# Patient Record
Sex: Female | Born: 1935 | Race: White | Hispanic: No | Smoking: Former smoker
Health system: Southern US, Community
[De-identification: ages and names within clinical notes are randomized; demographics above are authoritative.]

## PROBLEM LIST (undated history)

## (undated) DIAGNOSIS — R0902 Hypoxemia: Secondary | ICD-10-CM

## (undated) DIAGNOSIS — C4492 Squamous cell carcinoma of skin, unspecified: Secondary | ICD-10-CM

## (undated) DIAGNOSIS — R32 Unspecified urinary incontinence: Secondary | ICD-10-CM

## (undated) DIAGNOSIS — M1990 Unspecified osteoarthritis, unspecified site: Secondary | ICD-10-CM

## (undated) DIAGNOSIS — C801 Malignant (primary) neoplasm, unspecified: Secondary | ICD-10-CM

## (undated) DIAGNOSIS — R351 Nocturia: Secondary | ICD-10-CM

## (undated) DIAGNOSIS — G4700 Insomnia, unspecified: Secondary | ICD-10-CM

## (undated) DIAGNOSIS — G459 Transient cerebral ischemic attack, unspecified: Secondary | ICD-10-CM

## (undated) DIAGNOSIS — T7840XA Allergy, unspecified, initial encounter: Secondary | ICD-10-CM

## (undated) DIAGNOSIS — K219 Gastro-esophageal reflux disease without esophagitis: Secondary | ICD-10-CM

## (undated) DIAGNOSIS — R197 Diarrhea, unspecified: Secondary | ICD-10-CM

## (undated) DIAGNOSIS — I639 Cerebral infarction, unspecified: Secondary | ICD-10-CM

## (undated) DIAGNOSIS — N393 Stress incontinence (female) (male): Secondary | ICD-10-CM

## (undated) DIAGNOSIS — A4151 Sepsis due to Escherichia coli [E. coli]: Secondary | ICD-10-CM

## (undated) HISTORY — DX: Transient cerebral ischemic attack, unspecified: G45.9

## (undated) HISTORY — DX: Gastro-esophageal reflux disease without esophagitis: K21.9

## (undated) HISTORY — DX: Unspecified osteoarthritis, unspecified site: M19.90

## (undated) HISTORY — DX: Diarrhea, unspecified: R19.7

## (undated) HISTORY — DX: Squamous cell carcinoma of skin, unspecified: C44.92

## (undated) HISTORY — DX: Hypoxemia: R09.02

## (undated) HISTORY — DX: Stress incontinence (female) (male): N39.3

## (undated) HISTORY — DX: Malignant (primary) neoplasm, unspecified: C80.1

## (undated) HISTORY — DX: Allergy, unspecified, initial encounter: T78.40XA

## (undated) HISTORY — DX: Nocturia: R35.1

## (undated) HISTORY — DX: Unspecified urinary incontinence: R32

---
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
## 1957-04-22 HISTORY — PX: APPENDECTOMY: SHX54

## 1957-04-22 HISTORY — PX: ABDOMINAL HYSTERECTOMY: SHX81

## 1980-04-22 HISTORY — PX: BREAST SURGERY: SHX581

## 1999-05-15 ENCOUNTER — Encounter: Payer: Self-pay | Admitting: Family Medicine

## 1999-05-15 ENCOUNTER — Encounter: Admission: RE | Admit: 1999-05-15 | Discharge: 1999-05-15 | Payer: Self-pay | Admitting: Family Medicine

## 2007-06-09 ENCOUNTER — Ambulatory Visit (HOSPITAL_COMMUNITY): Admission: RE | Admit: 2007-06-09 | Discharge: 2007-06-09 | Payer: Self-pay | Admitting: Family Medicine

## 2007-06-25 ENCOUNTER — Ambulatory Visit: Admission: RE | Admit: 2007-06-25 | Discharge: 2007-06-25 | Payer: Self-pay | Admitting: Family Medicine

## 2007-06-25 ENCOUNTER — Encounter: Payer: Self-pay | Admitting: Family Medicine

## 2007-06-25 ENCOUNTER — Ambulatory Visit: Payer: Self-pay | Admitting: Vascular Surgery

## 2007-07-14 ENCOUNTER — Ambulatory Visit (HOSPITAL_COMMUNITY): Admission: RE | Admit: 2007-07-14 | Discharge: 2007-07-14 | Payer: Self-pay | Admitting: General Surgery

## 2008-12-22 ENCOUNTER — Encounter: Admission: RE | Admit: 2008-12-22 | Discharge: 2008-12-22 | Payer: Self-pay | Admitting: Family Medicine

## 2010-01-11 ENCOUNTER — Encounter: Admission: RE | Admit: 2010-01-11 | Discharge: 2010-01-11 | Payer: Self-pay | Admitting: Family Medicine

## 2010-12-04 ENCOUNTER — Telehealth: Payer: Self-pay | Admitting: Family Medicine

## 2010-12-04 NOTE — Telephone Encounter (Signed)
I am doing this on a case by case basis.  Since I have seen previously I will be happy to see again.

## 2010-12-04 NOTE — Telephone Encounter (Signed)
[name] was a pt of yours at [facility]. She is a medicare pt and would like to be your pt again. Are you excepting any new medicare pt at this time?

## 2010-12-07 ENCOUNTER — Other Ambulatory Visit: Payer: Self-pay | Admitting: Family Medicine

## 2010-12-07 DIAGNOSIS — I6529 Occlusion and stenosis of unspecified carotid artery: Secondary | ICD-10-CM

## 2010-12-17 ENCOUNTER — Other Ambulatory Visit: Payer: Self-pay | Admitting: Family Medicine

## 2010-12-17 DIAGNOSIS — Z1231 Encounter for screening mammogram for malignant neoplasm of breast: Secondary | ICD-10-CM

## 2010-12-27 ENCOUNTER — Ambulatory Visit: Payer: Self-pay | Admitting: Family Medicine

## 2011-01-08 ENCOUNTER — Ambulatory Visit (HOSPITAL_COMMUNITY): Payer: Self-pay

## 2011-01-16 ENCOUNTER — Ambulatory Visit
Admission: RE | Admit: 2011-01-16 | Discharge: 2011-01-16 | Disposition: A | Discharge status: Home / Self Care | Payer: Medicare Other | Source: Ambulatory Visit | Attending: Family Medicine | Admitting: Family Medicine

## 2011-01-16 DIAGNOSIS — I6529 Occlusion and stenosis of unspecified carotid artery: Secondary | ICD-10-CM

## 2011-01-22 ENCOUNTER — Ambulatory Visit (HOSPITAL_COMMUNITY)
Admission: RE | Admit: 2011-01-22 | Discharge: 2011-01-22 | Disposition: A | Discharge status: Home / Self Care | Payer: Medicare Other | Source: Ambulatory Visit | Attending: Family Medicine | Admitting: Family Medicine

## 2011-01-22 DIAGNOSIS — Z1231 Encounter for screening mammogram for malignant neoplasm of breast: Secondary | ICD-10-CM | POA: Insufficient documentation

## 2011-02-01 ENCOUNTER — Ambulatory Visit: Payer: Self-pay | Admitting: Family Medicine

## 2011-02-18 LAB — HM MAMMOGRAPHY: HM Mammogram: NEGATIVE

## 2011-03-21 ENCOUNTER — Ambulatory Visit (INDEPENDENT_AMBULATORY_CARE_PROVIDER_SITE_OTHER): Payer: Medicare Other | Admitting: Family Medicine

## 2011-03-21 ENCOUNTER — Encounter: Payer: Self-pay | Admitting: Family Medicine

## 2011-03-21 DIAGNOSIS — M19049 Primary osteoarthritis, unspecified hand: Secondary | ICD-10-CM

## 2011-03-21 DIAGNOSIS — N393 Stress incontinence (female) (male): Secondary | ICD-10-CM | POA: Insufficient documentation

## 2011-03-21 DIAGNOSIS — C4492 Squamous cell carcinoma of skin, unspecified: Secondary | ICD-10-CM

## 2011-03-21 DIAGNOSIS — K219 Gastro-esophageal reflux disease without esophagitis: Secondary | ICD-10-CM | POA: Insufficient documentation

## 2011-03-21 NOTE — Progress Notes (Signed)
  Subjective:    Patient ID: [name], female    DOB: 06-17-1922, 77 y.o.   MRN: [id number]  HPI  New patient to establish care. Past medical history reviewed. She has history of squamous cell skin cancer, stress urine incontinence, osteoarthritis mostly involving hands, and history of GERD.  Nexium 40 mg once daily. History of frequent bowel movements during the past year. Has seen gastroenterologist. Records pending. Per patient had EGD and colonoscopy last March. She has some sort of polyp that was removed at [facility]. She continues to have frequent bowel movements but no diarrhea. Mild weight loss during the past year. She states several labs have been done.  Surgical history significant for tonsillectomy and partial hysterectomy for menorrhagia many years ago. She quit smoking after 30 pack year history around age 25.  Family history is significant for sister with breast cancer. Father had some type of bone cancer. Patient is widowed. Smoking history as above. No alcohol use. Lives alone. Exercises 3 days per week.  Past Medical History  Diagnosis Date  . Arthritis   . Cancer     squamous cell  . Incontinence of urine    Past Surgical History  Procedure Date  . Breast surgery 1981    breast  . Tonsillectomy   . Abdominal hysterectomy     reports that she quit smoking about 34 years ago. Her smoking use included Cigarettes. She has a 15 pack-year smoking history. She does not have any smokeless tobacco history on file. Her alcohol and drug histories not on file. family history includes Arthritis in her mother and Cancer in her sister. Not on File    Review of Systems  Constitutional: Negative for fever, activity change, appetite change and fatigue.  HENT: Negative for hearing loss, ear pain, sore throat and trouble swallowing.   Eyes: Negative for visual disturbance.  Respiratory: Negative for cough and shortness of breath.   Cardiovascular: Negative for chest pain  and palpitations.  Gastrointestinal: Negative for abdominal pain, diarrhea, constipation and blood in stool.  Genitourinary: Negative for dysuria and hematuria.  Musculoskeletal: Positive for arthralgias. Negative for myalgias and back pain.  Skin: Negative for rash.  Neurological: Negative for dizziness, syncope and headaches.  Hematological: Negative for adenopathy.  Psychiatric/Behavioral: Negative for confusion and dysphoric mood.       Objective:   Physical Exam  Constitutional: She appears well-developed and well-nourished.  HENT:  Right Ear: External ear normal.  Left Ear: External ear normal.  Mouth/Throat: Oropharynx is clear and moist.  Neck: Neck supple.  Cardiovascular: Normal rate and regular rhythm.   Pulmonary/Chest: Effort normal and breath sounds normal. No respiratory distress. She has no wheezes. She has no rales.  Abdominal: Soft. There is no tenderness.  Musculoskeletal: She exhibits no edema.       Osteoarthritis changes hands  Lymphadenopathy:    She has no cervical adenopathy.          Assessment & Plan:  #1 osteoarthritis mostly involving hands. Takes Tylenol as needed #2 history of squamous cell skin cancer. Followed by dermatology  #3 history of urine stress incontinence. Minimal symptoms currently. Observe #4 history of GERD stable continue Nexium. Will need refills in one month  #5 health maintenance. Schedule Medicare wellness exam for February  #6 history of smoking previously

## 2011-03-22 ENCOUNTER — Telehealth: Payer: Self-pay | Admitting: Family Medicine

## 2011-03-22 NOTE — Telephone Encounter (Signed)
Pt wanted you to know what she contacted her [employer] rep and was informed not to get a new script until 04/24/11. Pt will call back end of December with refill info

## 2011-04-04 ENCOUNTER — Ambulatory Visit (INDEPENDENT_AMBULATORY_CARE_PROVIDER_SITE_OTHER): Payer: Medicare Other

## 2011-04-04 DIAGNOSIS — S91009A Unspecified open wound, unspecified ankle, initial encounter: Secondary | ICD-10-CM

## 2011-04-04 DIAGNOSIS — S81009A Unspecified open wound, unspecified knee, initial encounter: Secondary | ICD-10-CM

## 2011-04-04 DIAGNOSIS — M79609 Pain in unspecified limb: Secondary | ICD-10-CM

## 2011-04-08 ENCOUNTER — Telehealth: Payer: Self-pay | Admitting: *Deleted

## 2011-04-08 MED ORDER — ESOMEPRAZOLE MAGNESIUM 40 MG PO CPDR: 40.0000 mg | DELAYED_RELEASE_CAPSULE | Freq: Every day | ORAL | Status: DC

## 2011-04-08 NOTE — Telephone Encounter (Signed)
Rx sent to pharmacy   

## 2011-04-08 NOTE — Telephone Encounter (Signed)
Pt needs a refill on nexium 90 days with 3 refills. Left message for pt to call back to let us know what mail order company to send rx to.

## 2011-04-23 HISTORY — PX: COLONOSCOPY: SHX174

## 2011-04-30 DIAGNOSIS — M205X9 Other deformities of toe(s) (acquired), unspecified foot: Secondary | ICD-10-CM | POA: Diagnosis not present

## 2011-04-30 DIAGNOSIS — G5760 Lesion of plantar nerve, unspecified lower limb: Secondary | ICD-10-CM | POA: Diagnosis not present

## 2011-05-02 DIAGNOSIS — H353190 Nonexudative age-related macular degeneration, unspecified eye, stage unspecified: Secondary | ICD-10-CM | POA: Diagnosis not present

## 2011-05-02 DIAGNOSIS — Z961 Presence of intraocular lens: Secondary | ICD-10-CM | POA: Diagnosis not present

## 2011-05-03 ENCOUNTER — Telehealth: Payer: Self-pay | Admitting: *Deleted

## 2011-05-03 NOTE — Telephone Encounter (Addendum)
Pt needs a fax order sent to [facility] at [phone]  TENS UNIT

## 2011-05-03 NOTE — Telephone Encounter (Addendum)
Uses forf head pain.  Scheduled appt to come and discuss with Dr. Caryl Never.

## 2011-05-03 NOTE — Telephone Encounter (Signed)
[name], can you find out what the Tens Unit is needed for.  I do not see anything in the chart to inform Dr Caryl Never what this request is for.  Thanks

## 2011-05-07 ENCOUNTER — Encounter: Payer: Self-pay | Admitting: Family Medicine

## 2011-05-07 ENCOUNTER — Ambulatory Visit (INDEPENDENT_AMBULATORY_CARE_PROVIDER_SITE_OTHER): Payer: Medicare Other | Admitting: Family Medicine

## 2011-05-07 VITALS — BP 110/72 | HR 76 | Temp 98.3°F | Wt 99.0 lb

## 2011-05-07 DIAGNOSIS — S81009A Unspecified open wound, unspecified knee, initial encounter: Secondary | ICD-10-CM | POA: Diagnosis not present

## 2011-05-07 DIAGNOSIS — S91009A Unspecified open wound, unspecified ankle, initial encounter: Secondary | ICD-10-CM | POA: Diagnosis not present

## 2011-05-07 DIAGNOSIS — M5481 Occipital neuralgia: Secondary | ICD-10-CM

## 2011-05-07 DIAGNOSIS — R21 Rash and other nonspecific skin eruption: Secondary | ICD-10-CM

## 2011-05-07 DIAGNOSIS — M531 Cervicobrachial syndrome: Secondary | ICD-10-CM

## 2011-05-07 DIAGNOSIS — S81801A Unspecified open wound, right lower leg, initial encounter: Secondary | ICD-10-CM

## 2011-05-07 MED ORDER — CLOTRIMAZOLE-BETAMETHASONE 1-0.05 % EX CREA: TOPICAL_CREAM | Freq: Two times a day (BID) | CUTANEOUS | Status: DC

## 2011-05-07 NOTE — Progress Notes (Signed)
  Subjective:    Patient ID: [name], female    DOB: [birth year], 78 y.o.   MRN: [id number]  HPI  Patient seen for several issues as follows  Several year history of upper back and neck pain with what sounds like occipital neuralgia. She has previously seen neurologist and has used a TENS unit for several years and is requesting renewal. She has not had any recent headaches and upper back and neck pain has greatly improved. She is apparently used TENS unit for several years. Denies any upper extremity radiculopathy symptoms or weakness.  Recurrent pruritic rash chest wall. Recently had rash left upper breast. Resolved with Lotrisone cream which she received from prior physician and requesting refill.  Very thin skin. Recent break in skin right posterior thigh. Does not recall when injury occurred. No drainage or signs of infection. Keeping clean with soap and water. Denies recent injury. No history of diabetes.  Past Medical History  Diagnosis Date  . Arthritis   . Cancer     squamous cell  . Incontinence of urine    Past Surgical History  Procedure Date  . Breast surgery 1981    breast  . Tonsillectomy   . Abdominal hysterectomy     reports that she quit smoking about 34 years ago. Her smoking use included Cigarettes. She has a 15 pack-year smoking history. She does not have any smokeless tobacco history on file. Her alcohol and drug histories not on file. family history includes Arthritis in her mother and Cancer in her sister. Allergies  Allergen Reactions  . Penicillin G Itching  . Septra (Bactrim)     Stomach concerns.        Review of Systems  Constitutional: Negative for fever, chills and unexpected weight change.  Respiratory: Negative for cough and shortness of breath.   Cardiovascular: Negative for chest pain.  Gastrointestinal: Negative for abdominal pain.  Neurological: Negative for dizziness.  Hematological: Negative for adenopathy.         Objective:   Physical Exam  Constitutional: She appears well-developed and well-nourished.  HENT:  Mouth/Throat: Oropharynx is clear and moist.  Neck: Neck supple.  Cardiovascular: Normal rate and regular rhythm.   Pulmonary/Chest: Effort normal and breath sounds normal. No respiratory distress. She has no wheezes. She has no rales.  Lymphadenopathy:    She has no cervical adenopathy.  Skin: No rash noted.       Right posterior calf reveals approximately 1 cm superficial wound. No erythema or purulent drainage. Nontender          Assessment & Plan:  #1 chronic upper back and neck pain with occipital neuralgia. Refilled prescription for TENS unit #2 recurrent pruritic rash chest wall with none currently. Refilled Lotrisone cream which has worked in the past #3 leg wound right posterior calf. Patient does not recall injury. Wound care structure given. Followup if not healing in 2-3 weeks

## 2011-05-09 DIAGNOSIS — D485 Neoplasm of uncertain behavior of skin: Secondary | ICD-10-CM | POA: Diagnosis not present

## 2011-05-09 DIAGNOSIS — L97909 Non-pressure chronic ulcer of unspecified part of unspecified lower leg with unspecified severity: Secondary | ICD-10-CM | POA: Diagnosis not present

## 2011-05-09 DIAGNOSIS — C44711 Basal cell carcinoma of skin of unspecified lower limb, including hip: Secondary | ICD-10-CM | POA: Diagnosis not present

## 2011-05-09 DIAGNOSIS — L821 Other seborrheic keratosis: Secondary | ICD-10-CM | POA: Diagnosis not present

## 2011-05-09 DIAGNOSIS — Z85828 Personal history of other malignant neoplasm of skin: Secondary | ICD-10-CM | POA: Diagnosis not present

## 2011-05-13 ENCOUNTER — Ambulatory Visit: Payer: Medicare Other | Admitting: Family Medicine

## 2011-05-16 DIAGNOSIS — S90919A Unspecified superficial injury of unspecified ankle, initial encounter: Secondary | ICD-10-CM | POA: Diagnosis not present

## 2011-05-16 DIAGNOSIS — S70919A Unspecified superficial injury of unspecified hip, initial encounter: Secondary | ICD-10-CM | POA: Diagnosis not present

## 2011-05-16 DIAGNOSIS — S8010XA Contusion of unspecified lower leg, initial encounter: Secondary | ICD-10-CM | POA: Diagnosis not present

## 2011-05-22 DIAGNOSIS — M205X9 Other deformities of toe(s) (acquired), unspecified foot: Secondary | ICD-10-CM | POA: Diagnosis not present

## 2011-05-28 DIAGNOSIS — S70929A Unspecified superficial injury of unspecified thigh, initial encounter: Secondary | ICD-10-CM | POA: Diagnosis not present

## 2011-05-28 DIAGNOSIS — S70919A Unspecified superficial injury of unspecified hip, initial encounter: Secondary | ICD-10-CM | POA: Diagnosis not present

## 2011-06-04 DIAGNOSIS — L570 Actinic keratosis: Secondary | ICD-10-CM | POA: Diagnosis not present

## 2011-06-04 DIAGNOSIS — L280 Lichen simplex chronicus: Secondary | ICD-10-CM | POA: Diagnosis not present

## 2011-06-04 DIAGNOSIS — L905 Scar conditions and fibrosis of skin: Secondary | ICD-10-CM | POA: Diagnosis not present

## 2011-06-04 DIAGNOSIS — C44721 Squamous cell carcinoma of skin of unspecified lower limb, including hip: Secondary | ICD-10-CM | POA: Diagnosis not present

## 2011-06-06 DIAGNOSIS — S80929A Unspecified superficial injury of unspecified lower leg, initial encounter: Secondary | ICD-10-CM | POA: Diagnosis not present

## 2011-06-06 DIAGNOSIS — S70919A Unspecified superficial injury of unspecified hip, initial encounter: Secondary | ICD-10-CM | POA: Diagnosis not present

## 2011-06-18 DIAGNOSIS — S70919A Unspecified superficial injury of unspecified hip, initial encounter: Secondary | ICD-10-CM | POA: Diagnosis not present

## 2011-06-18 DIAGNOSIS — S90919A Unspecified superficial injury of unspecified ankle, initial encounter: Secondary | ICD-10-CM | POA: Diagnosis not present

## 2011-06-20 ENCOUNTER — Ambulatory Visit: Payer: Medicare Other | Admitting: Family Medicine

## 2011-06-24 ENCOUNTER — Telehealth: Payer: Self-pay | Admitting: *Deleted

## 2011-06-24 NOTE — Telephone Encounter (Signed)
Pt is asking what her heart rate should be running.  It is about 80, but she was hoping for about 72.

## 2011-06-24 NOTE — Telephone Encounter (Signed)
This does not sound worrisome, but follow up if she has concerns

## 2011-06-24 NOTE — Telephone Encounter (Signed)
Left a message for pt to return call 

## 2011-06-26 ENCOUNTER — Ambulatory Visit (INDEPENDENT_AMBULATORY_CARE_PROVIDER_SITE_OTHER): Payer: Medicare Other | Admitting: Family Medicine

## 2011-06-26 ENCOUNTER — Telehealth: Payer: Self-pay

## 2011-06-26 ENCOUNTER — Encounter: Payer: Self-pay | Admitting: Family Medicine

## 2011-06-26 VITALS — BP 120/62 | Temp 98.2°F | Wt 98.0 lb

## 2011-06-26 DIAGNOSIS — K219 Gastro-esophageal reflux disease without esophagitis: Secondary | ICD-10-CM | POA: Diagnosis not present

## 2011-06-26 DIAGNOSIS — R6251 Failure to thrive (child): Secondary | ICD-10-CM

## 2011-06-26 DIAGNOSIS — R627 Adult failure to thrive: Secondary | ICD-10-CM | POA: Diagnosis not present

## 2011-06-26 DIAGNOSIS — R197 Diarrhea, unspecified: Secondary | ICD-10-CM

## 2011-06-26 DIAGNOSIS — K529 Noninfective gastroenteritis and colitis, unspecified: Secondary | ICD-10-CM

## 2011-06-26 LAB — CBC WITH DIFFERENTIAL/PLATELET
Basophils Relative: 0.3 % (ref 0.0–3.0)
Eosinophils Relative: 0.4 % (ref 0.0–5.0)
MCV: 92.9 fl (ref 78.0–100.0)
Monocytes Absolute: 0.5 10*3/uL (ref 0.1–1.0)
Neutrophils Relative %: 65.9 % (ref 43.0–77.0)
RBC: 4.33 Mil/uL (ref 3.87–5.11)
WBC: 7.2 10*3/uL (ref 4.5–10.5)

## 2011-06-26 LAB — HEPATIC FUNCTION PANEL
ALT: 26 U/L (ref 0–35)
AST: 30 U/L (ref 0–37)
Bilirubin, Direct: 0 mg/dL (ref 0.0–0.3)
Total Bilirubin: 0.4 mg/dL (ref 0.3–1.2)

## 2011-06-26 LAB — BASIC METABOLIC PANEL
Chloride: 103 mEq/L (ref 96–112)
Creatinine, Ser: 0.7 mg/dL (ref 0.4–1.2)
Potassium: 4.4 mEq/L (ref 3.5–5.1)
Sodium: 136 mEq/L (ref 135–145)

## 2011-06-26 NOTE — Telephone Encounter (Signed)
[facility] is calling for immunization records on pt  Last pneumonia immunization and tdap   Please page NANCY [phone]

## 2011-06-26 NOTE — Progress Notes (Signed)
  Subjective:    Patient ID: [name], female    DOB: [birth year], 78 y.o.   MRN: [id number]  HPI  Patient is seen for followup. She has history of GERD which is fairly well controlled with Nexium. Her major concern is poor weight gain. She weighed 112 pounds most of her adult life currently around 98 pounds. Last weight here 99 pounds. Generally eats 3 meals per day. Not much appetite for snacking. She's had chronic history of frequent loose stools. Has seen gastroenterologist. Has had both upper and lower endoscopy.Marland Kitchen He did not have any evidence for celiac disease or major absorptive problem. She takes Imodium usually 2 or 3 tablets daily per instructions of gastroenterologist.  She had thyroid functions and some other labs to evaluate over 2 years ago. She is not aware of any since then.  Past Medical History  Diagnosis Date  . Arthritis   . Cancer     squamous cell  . Incontinence of urine    Past Surgical History  Procedure Date  . Breast surgery 1981    breast  . Tonsillectomy   . Abdominal hysterectomy     reports that she quit smoking about 34 years ago. Her smoking use included Cigarettes. She has a 15 pack-year smoking history. She does not have any smokeless tobacco history on file. Her alcohol and drug histories not on file. family history includes Arthritis in her mother and Cancer in her sister. Allergies  Allergen Reactions  . Penicillin G Itching  . Septra (Bactrim)     Stomach concerns.       Review of Systems  Constitutional: Positive for appetite change. Negative for fever, chills, activity change and fatigue.  HENT: Negative for sore throat and trouble swallowing.   Respiratory: Negative for cough and shortness of breath.   Cardiovascular: Negative for chest pain, palpitations and leg swelling.  Gastrointestinal: Negative for abdominal pain and blood in stool.  Genitourinary: Negative for dysuria and hematuria.  Musculoskeletal: Negative for back  pain.  Skin: Negative for rash.  Neurological: Negative for dizziness and headaches.  Hematological: Negative for adenopathy. Does not bruise/bleed easily.  Psychiatric/Behavioral: Negative for confusion.       Objective:   Physical Exam  Constitutional: She is oriented to person, place, and time. She appears well-developed and well-nourished.  HENT:  Mouth/Throat: Oropharynx is clear and moist.  Neck: Neck supple. No thyromegaly present.  Cardiovascular: Normal rate and regular rhythm.  Exam reveals no gallop.   Pulmonary/Chest: Effort normal and breath sounds normal. No respiratory distress. She has no wheezes. She has no rales.  Abdominal: Soft. Bowel sounds are normal. She exhibits no distension. There is no tenderness. There is no rebound and no guarding.  Musculoskeletal: She exhibits no edema.  Lymphadenopathy:    She has no cervical adenopathy.  Neurological: She is alert and oriented to person, place, and time. No cranial nerve deficit.  Skin: No rash noted.  Psychiatric: She has a normal mood and affect. Her behavior is normal. Judgment and thought content normal.          Assessment & Plan:  #1 GERD. Well controlled. Continue Nexium. #2 poor weight gain. She has not had significant loss since last visit.  About 12 pound loss over past 3-4 years. We explained some of this may be due to loss of muscle mass over time with aging. Continue regular exercises. Obtain basic screening labs including thyroid functions, CBC, hepatic panel, and basic metabolic panel

## 2011-06-27 NOTE — Progress Notes (Signed)
Quick Note:  Pt informed ______ 

## 2011-07-16 DIAGNOSIS — G5760 Lesion of plantar nerve, unspecified lower limb: Secondary | ICD-10-CM | POA: Diagnosis not present

## 2011-07-16 DIAGNOSIS — M1710 Unilateral primary osteoarthritis, unspecified knee: Secondary | ICD-10-CM | POA: Diagnosis not present

## 2011-07-17 ENCOUNTER — Encounter: Payer: Self-pay | Admitting: Family Medicine

## 2011-08-16 ENCOUNTER — Ambulatory Visit (INDEPENDENT_AMBULATORY_CARE_PROVIDER_SITE_OTHER): Payer: Medicare Other | Admitting: Family Medicine

## 2011-08-16 VITALS — BP 133/65 | HR 77 | Temp 98.3°F | Resp 16 | Ht 61.0 in | Wt 96.0 lb

## 2011-08-16 DIAGNOSIS — S81009A Unspecified open wound, unspecified knee, initial encounter: Secondary | ICD-10-CM

## 2011-08-16 DIAGNOSIS — S81801A Unspecified open wound, right lower leg, initial encounter: Secondary | ICD-10-CM

## 2011-08-16 DIAGNOSIS — S81809A Unspecified open wound, unspecified lower leg, initial encounter: Secondary | ICD-10-CM | POA: Diagnosis not present

## 2011-08-16 MED ORDER — MUPIROCIN CALCIUM 2 % EX CREA: TOPICAL_CREAM | Freq: Every day | CUTANEOUS | Status: AC

## 2011-08-16 NOTE — Progress Notes (Signed)
[facility]:  Office Visit  Chief Complaint:  Chief Complaint  Patient presents with  . Wound Check    wound on leg x 1 week    HPI: [name] is a 78 y.o. female who complains of  Right inner leg, just noticed it 1 week ago. Has not had any s/sx of infection except it has a red border. So she wanted to make sure. No fevers, chills. No drainage.   Takes 3 ASA per week. No other blood thinners.   Past Medical History  Diagnosis Date  . Arthritis   . Cancer     squamous cell  . Incontinence of urine    Past Surgical History  Procedure Date  . Breast surgery 1981    breast  . Tonsillectomy   . Abdominal hysterectomy    History   Social History  . Marital Status: Widowed    Spouse Name: N/A    Number of Children: N/A  . Years of Education: N/A   Social History Main Topics  . Smoking status: Former Smoker -- 1.0 packs/day for 15 years    Types: Cigarettes    Quit date: 03/20/1977  . Smokeless tobacco: None  . Alcohol Use: None  . Drug Use: None  . Sexually Active: None   Other Topics Concern  . None   Social History Narrative  . None   Family History  Problem Relation Age of Onset  . Arthritis Mother   . Cancer Sister     breast   Allergies  Allergen Reactions  . Penicillin G Itching  . Septra (Bactrim)     Stomach concerns.    Prior to Admission medications   Medication Sig Start Date End Date Taking? Authorizing Provider  clotrimazole-betamethasone (LOTRISONE) cream Apply topically 2 (two) times daily. 05/07/11 05/06/12  Kristian Covey, MD  esomeprazole (NEXIUM) 40 MG capsule Take 1 capsule (40 mg total) by mouth daily before breakfast. 04/08/11   Kristian Covey, MD     ROS: The patient denies fevers, chills, night sweats, unintentional weight loss, chest pain, palpitations, wheezing, dyspnea on exertion, nausea, vomiting, abdominal pain, dysuria, hematuria, melena, numbness, weakness, or tingling.   All other  systems have been reviewed and were otherwise negative with the exception of those mentioned in the HPI and as above.    PHYSICAL EXAM: Filed Vitals:   08/16/11 1533  BP: 133/65  Pulse: 77  Temp: 98.3 F (36.8 C)  Resp: 16   Filed Vitals:   08/16/11 1533  Height: 5\' 1"  (1.549 m)  Weight: 96 lb (43.545 kg)   Body mass index is 18.14 kg/(m^2).  General: Alert, no acute distress HEENT:  Normocephalic, atraumatic, oropharynx patent.  Cardiovascular:  Regular rate and rhythm, no rubs murmurs or gallops.  No Carotid bruits, radial pulse intact. No pedal edema.  Respiratory: Clear to auscultation bilaterally.  No wheezes, rales, or rhonchi.  No cyanosis, no use of accessory musculature GI: No organomegaly, abdomen is soft and non-tender, positive bowel sounds.  No masses. Skin: Right inner lower leg bruise, looks dry and is healing. Minimal erythema.  Neurologic: Facial musculature symmetric. Psychiatric: Patient is appropriate throughout our interaction. Lymphatic: No cervical lymphadenopathy Musculoskeletal: Gait intact.   LABS: Results for orders placed in visit on 06/26/11  BASIC METABOLIC PANEL      Component Value Range   Sodium 136  135 - 145 (mEq/L)   Potassium 4.4  3.5 - 5.1 (mEq/L)   Chloride  103  96 - 112 (mEq/L)   CO2 27  19 - 32 (mEq/L)   Glucose, Bld 112 (*) 70 - 99 (mg/dL)   BUN 18  6 - 23 (mg/dL)   Creatinine, Ser 0.7  0.4 - 1.2 (mg/dL)   Calcium 9.2  8.4 - 16.1 (mg/dL)   GFR 09.60  >45.40 (mL/min)  CBC WITH DIFFERENTIAL      Component Value Range   WBC 7.2  4.5 - 10.5 (K/uL)   RBC 4.33  3.87 - 5.11 (Mil/uL)   Hemoglobin 13.4  12.0 - 15.0 (g/dL)   HCT 98.1  19.1 - 47.8 (%)   MCV 92.9  78.0 - 100.0 (fl)   MCHC 33.2  30.0 - 36.0 (g/dL)   RDW 29.5  62.1 - 30.8 (%)   Platelets 212.0  150.0 - 400.0 (K/uL)   Neutrophils Relative 65.9  43.0 - 77.0 (%)   Lymphocytes Relative 26.0  12.0 - 46.0 (%)   Monocytes Relative 7.4  3.0 - 12.0 (%)   Eosinophils Relative  0.4  0.0 - 5.0 (%)   Basophils Relative 0.3  0.0 - 3.0 (%)   Neutro Abs 4.7  1.4 - 7.7 (K/uL)   Lymphs Abs 1.9  0.7 - 4.0 (K/uL)   Monocytes Absolute 0.5  0.1 - 1.0 (K/uL)   Eosinophils Absolute 0.0  0.0 - 0.7 (K/uL)   Basophils Absolute 0.0  0.0 - 0.1 (K/uL)  TSH      Component Value Range   TSH 1.02  0.35 - 5.50 (uIU/mL)  HEPATIC FUNCTION PANEL      Component Value Range   Total Bilirubin 0.4  0.3 - 1.2 (mg/dL)   Bilirubin, Direct 0.0  0.0 - 0.3 (mg/dL)   Alkaline Phosphatase 55  39 - 117 (U/L)   AST 30  0 - 37 (U/L)   ALT 26  0 - 35 (U/L)   Total Protein 6.1  6.0 - 8.3 (g/dL)   Albumin 3.7  3.5 - 5.2 (g/dL)     EKG/XRAY:   Primary read interpreted by Dr. Conley Rolls at [facility].   ASSESSMENT/PLAN: Encounter Diagnosis  Name Primary?  Marland Kitchen Wound of right leg Yes    Most likely a superficial skin laceration/bruise  due to thin skin Bactroban cream daily, nonadhesive dry dressing Wound care with soap and water     Madeline Perman PHUONG, DO 08/16/2011 3:52 PM

## 2011-08-19 ENCOUNTER — Telehealth: Payer: Self-pay | Admitting: Family Medicine

## 2011-08-19 NOTE — Telephone Encounter (Signed)
Pt would like nancy to return her call concerning nutritionist recommendation

## 2011-08-19 NOTE — Telephone Encounter (Signed)
Office follow up to reassess. 

## 2011-08-19 NOTE — Telephone Encounter (Signed)
Pt has seen nutritionist Durwin Nora.  She has requested a lab that will check for CA, heart disease and osteoprosis.  Nutritionist also requesting labs, questioning Hct and Hgb.  Pt has lost 15 pounds and is now 95 pounds.  Please advise

## 2011-08-20 NOTE — Telephone Encounter (Signed)
Pt has appt tomorrow at 415pm

## 2011-08-21 ENCOUNTER — Ambulatory Visit (INDEPENDENT_AMBULATORY_CARE_PROVIDER_SITE_OTHER): Payer: Medicare Other | Admitting: Family Medicine

## 2011-08-21 ENCOUNTER — Encounter: Payer: Self-pay | Admitting: Family Medicine

## 2011-08-21 VITALS — BP 130/70 | Temp 97.8°F | Wt 96.0 lb

## 2011-08-21 DIAGNOSIS — R627 Adult failure to thrive: Secondary | ICD-10-CM | POA: Diagnosis not present

## 2011-08-21 DIAGNOSIS — L03115 Cellulitis of right lower limb: Secondary | ICD-10-CM

## 2011-08-21 DIAGNOSIS — L02419 Cutaneous abscess of limb, unspecified: Secondary | ICD-10-CM | POA: Diagnosis not present

## 2011-08-21 DIAGNOSIS — S81009A Unspecified open wound, unspecified knee, initial encounter: Secondary | ICD-10-CM | POA: Diagnosis not present

## 2011-08-21 DIAGNOSIS — L03119 Cellulitis of unspecified part of limb: Secondary | ICD-10-CM | POA: Diagnosis not present

## 2011-08-21 DIAGNOSIS — S81801A Unspecified open wound, right lower leg, initial encounter: Secondary | ICD-10-CM

## 2011-08-21 DIAGNOSIS — S91009A Unspecified open wound, unspecified ankle, initial encounter: Secondary | ICD-10-CM

## 2011-08-21 MED ORDER — CEPHALEXIN 500 MG PO CAPS: 500.0000 mg | ORAL_CAPSULE | Freq: Three times a day (TID) | ORAL | Status: AC

## 2011-08-21 NOTE — Progress Notes (Signed)
Subjective:    Patient ID: [name], female    DOB: Jun 20, 1922, 78 y.o.   MRN: [id number]  HPI  Patient seen for followup. She's had difficulties with weight gain and some slight weight loss over the past several months. She recently seeing nutritionist who apparently has suggested several labs. We actually saw her back in March and had gotten several labs which were basically unremarkable. In looking over documented weights she was 100 pounds back in November of 2012 and 96 pounds today. Generally has good appetite. Eating regular meals. Has had some recurrent loose stools and had full endoscopic workup with gastroenterology with no etiology found. She takes Imodium and with this has normal formed stools. No history of malabsorptive disorder and recent albumin was normal.  She denies any abdominal pain, headache, myalgias, arthralgias, dysuria, bloody stools, hematuria. Last mammogram was last October. Denies any fever, chills, or night sweats. No cough or dyspnea.  Separate problem of right lower medial leg wound. No history of known injury. She noted this about a week ago at the urgent care was prescribed Bactroban. She's had some surrounding redness since then but no pain. No fever. No chills. No drainage. Cleaning with soap and water. No history of peripheral vascular disease.  Past Medical History  Diagnosis Date  . Arthritis   . Cancer     squamous cell  . Incontinence of urine    Past Surgical History  Procedure Date  . Breast surgery 1981    breast  . Tonsillectomy   . Abdominal hysterectomy     reports that she quit smoking about 34 years ago. Her smoking use included Cigarettes. She has a 15 pack-year smoking history. She does not have any smokeless tobacco history on file. Her alcohol and drug histories not on file. family history includes Arthritis in her mother and Cancer in her sister. Allergies  Allergen Reactions  . Penicillin G Itching  . Septra (Bactrim)    Stomach concerns.       Review of Systems  Constitutional: Negative for fever, chills, activity change, appetite change and fatigue.  Respiratory: Negative for cough, shortness of breath and wheezing.   Cardiovascular: Negative for chest pain, palpitations and leg swelling.  Gastrointestinal: Negative for nausea, vomiting, abdominal pain, blood in stool and abdominal distention.  Genitourinary: Negative for dysuria and hematuria.  Musculoskeletal: Negative for back pain.  Skin: Positive for wound (right leg wound).  Neurological: Negative for dizziness, syncope and headaches.  Hematological: Negative for adenopathy. Bruises/bleeds easily (mostly arms and she has done this for years).  Psychiatric/Behavioral: Negative for dysphoric mood.       Objective:   Physical Exam  Constitutional: She is oriented to person, place, and time. She appears well-developed and well-nourished.  HENT:  Right Ear: External ear normal.  Left Ear: External ear normal.  Mouth/Throat: Oropharynx is clear and moist.  Neck: Neck supple. No thyromegaly present.  Cardiovascular: Normal rate and regular rhythm.   Pulmonary/Chest: Effort normal and breath sounds normal. No respiratory distress. She has no wheezes. She has no rales.  Abdominal: Soft. She exhibits no mass. There is no tenderness.  Musculoskeletal: She exhibits no edema.  Lymphadenopathy:    She has no cervical adenopathy.  Neurological: She is alert and oriented to person, place, and time. No cranial nerve deficit.  Skin:       Right medial leg reveals wound which is superficial and about 2 cm diameter. She has some surrounding erythema which extends down  to the medial aspect of ankle. Slight warmth and minimally tender to palpation. Minimal edema  Psychiatric: She has a normal mood and affect. Her behavior is normal.          Assessment & Plan:  #1 poor weight gain. She's had some weight loss but this has been minimal since we have  followed her here. She looks well and has not had any worrisome symptoms such as appetite loss and is still exercising regularly 3 times a week without difficulty. She is currently seeing nutritionist to try to get suggestions for supplementing calories. We discussed other workup including things like CAT scan abdomen and pelvis and she wishes to wait - which seems reasonable at her age and I think would have low yield given lack of specific findings above #2 right leg cellulitis related to recent leg wound. Start cephalexin 500 mg 3 times a day and frequent elevation. Reassess in one week and sooner as needed

## 2011-08-21 NOTE — Patient Instructions (Signed)

## 2011-08-28 ENCOUNTER — Encounter: Payer: Self-pay | Admitting: Family Medicine

## 2011-08-28 ENCOUNTER — Ambulatory Visit (INDEPENDENT_AMBULATORY_CARE_PROVIDER_SITE_OTHER): Payer: Medicare Other | Admitting: Family Medicine

## 2011-08-28 VITALS — BP 110/48 | Temp 97.6°F | Wt 99.0 lb

## 2011-08-28 DIAGNOSIS — L03119 Cellulitis of unspecified part of limb: Secondary | ICD-10-CM | POA: Diagnosis not present

## 2011-08-28 DIAGNOSIS — L02419 Cutaneous abscess of limb, unspecified: Secondary | ICD-10-CM | POA: Diagnosis not present

## 2011-08-28 DIAGNOSIS — L03115 Cellulitis of right lower limb: Secondary | ICD-10-CM

## 2011-08-28 NOTE — Progress Notes (Signed)
  Subjective:    Patient ID: [name], female    DOB: 1922-11-28, 78 y.o.   MRN: [id number]  HPI  Followup cellulitis right leg. Refer to prior note. Since then has gained 3 pounds. She's been concerned about weight loss but has had negligible weight loss and recent lab work unremarkable with no concerning symptoms. Leg improved somewhat. Still has edema but less redness. No fever or chills. One more day of Keflex. Elevating leg some  Review of Systems  Constitutional: Negative for fever and chills.  Musculoskeletal: Negative for gait problem.       Objective:   Physical Exam  Constitutional: She appears well-developed and well-nourished.  Cardiovascular: Normal rate and regular rhythm.   Pulmonary/Chest: Effort normal and breath sounds normal. No respiratory distress. She has no wheezes. She has no rales.  Skin:        Right leg reveals some mild edema around the ankle region. Less erythema. No warmth. Wound right medial leg appears to be granulating well. No purulent drainage.          Assessment & Plan:  Cellulitis and wound right leg healing and improved. Reviewed proper wound care. Followup of any fever or increased redness

## 2011-08-28 NOTE — Patient Instructions (Signed)
Follow up promptly for any fever or increased leg redness.

## 2011-08-29 ENCOUNTER — Ambulatory Visit (INDEPENDENT_AMBULATORY_CARE_PROVIDER_SITE_OTHER): Payer: Medicare Other | Admitting: Family Medicine

## 2011-08-29 ENCOUNTER — Ambulatory Visit
Admission: RE | Admit: 2011-08-29 | Discharge: 2011-08-29 | Disposition: A | Discharge status: Home / Self Care | Payer: Medicare Other | Source: Ambulatory Visit | Attending: Family Medicine | Admitting: Family Medicine

## 2011-08-29 VITALS — BP 144/68 | HR 65 | Temp 97.7°F | Resp 16 | Ht 61.5 in | Wt 96.6 lb

## 2011-08-29 DIAGNOSIS — R42 Dizziness and giddiness: Secondary | ICD-10-CM

## 2011-08-29 DIAGNOSIS — R51 Headache: Secondary | ICD-10-CM

## 2011-08-29 DIAGNOSIS — R2689 Other abnormalities of gait and mobility: Secondary | ICD-10-CM

## 2011-08-29 DIAGNOSIS — R29818 Other symptoms and signs involving the nervous system: Secondary | ICD-10-CM

## 2011-08-29 DIAGNOSIS — I999 Unspecified disorder of circulatory system: Secondary | ICD-10-CM | POA: Diagnosis not present

## 2011-08-29 DIAGNOSIS — R110 Nausea: Secondary | ICD-10-CM | POA: Diagnosis not present

## 2011-08-29 DIAGNOSIS — M25559 Pain in unspecified hip: Secondary | ICD-10-CM | POA: Diagnosis not present

## 2011-08-29 DIAGNOSIS — IMO0002 Reserved for concepts with insufficient information to code with codable children: Secondary | ICD-10-CM | POA: Diagnosis not present

## 2011-08-29 DIAGNOSIS — M25529 Pain in unspecified elbow: Secondary | ICD-10-CM | POA: Diagnosis not present

## 2011-08-29 LAB — POCT CBC
Granulocyte percent: 70.4 %G (ref 37–80)
HCT, POC: 43.7 % (ref 37.7–47.9)
Hemoglobin: 14.3 g/dL (ref 12.2–16.2)
POC Granulocyte: 6.5 (ref 2–6.9)
RBC: 4.61 M/uL (ref 4.04–5.48)
RDW, POC: 14.8 %

## 2011-08-29 LAB — GLUCOSE, POCT (MANUAL RESULT ENTRY): POC Glucose: 131

## 2011-08-29 MED ORDER — MECLIZINE HCL 25 MG PO TABS: 25.0000 mg | ORAL_TABLET | Freq: Three times a day (TID) | ORAL | Status: AC | PRN

## 2011-08-29 NOTE — Progress Notes (Signed)
Subjective:    Patient ID: [name], female    DOB: May 04, 1922, 78 y.o.   MRN: [id number]  HPI [name] is a 78 y.o. female  C/o dizziness and nausea since yesterday. Dx with R leg cellulitis - 08/21/11.  On Keflex.  Primary provider - Dr. Caryl Never.  Started feeling dizzy yesterday afternoon when at [facility]  New for her.  Felt lightheaded, no LOC,  Noticed both legs feeling a little weak yesterday afternoon - upper legs.  Both arms also feel a little weak. No slurred speech, No facial droop.  Normal vision. Dizziness just yesterday. Today balance feels off.  Heavy feeling in front of head and behind eyes..  Nausea started this morning.  No vomiting.  No chest pain or palpitations. Dizziness persisted, and difficulty walking straight this morning, and balance was off.  Not feeling confused.  Lives by self.  Had friend drive her here.  No new supplements.  Only new med - Keflex  Weight loss past 1 year.  Multiple BM's each day for year. Followed by primary care and GI - had colonoscopy and endoscopy - told was ok.  Improved BM's with Immodium 3x/day.  Followed by nutritionist - changed diet, including whole wheat bread. More BM's past few days, but no diarrhea.   No fevers.  No known hx heart disease, no hx CVA. Had TIA's 20-25 years ago.   Had MRI then - told no problems.  Today - notices a funny feeling in my head.  Taken off of Aspirin by nutritionist (prior on 3x / week)  Nonsmoker.  Review of Systems  Respiratory: Negative for chest tightness.   Cardiovascular: Negative for chest pain and palpitations.  Gastrointestinal: Negative for blood in stool.       No dark, tarry stools.  Neurological: Positive for dizziness, weakness, light-headedness and headaches. Negative for facial asymmetry.       Heaviness in head.       Objective:   Physical Exam  Constitutional: She is oriented to person, place, and time. She appears well-developed and well-nourished. No  distress.  HENT:  Head: Normocephalic and atraumatic.  Eyes: Conjunctivae and EOM are normal. Right pupil is reactive. Left pupil is reactive. Pupils are unequal.       R pupil approx 1mm more miotic, but reactive.  Neck: No thyromegaly present.  Cardiovascular: Normal rate and intact distal pulses.   Murmur heard. Pulmonary/Chest: Effort normal and breath sounds normal.  Abdominal: Soft. There is no tenderness.  Musculoskeletal: Normal range of motion.  Neurological: She is alert and oriented to person, place, and time. She has normal strength. She displays no tremor. No sensory deficit. She exhibits normal muscle tone. She displays a negative Romberg sign. Coordination abnormal. Gait normal.       Unsteady with heel to toe, and with challenge on Romberg.  No focal weakness appreciated.  No apparent pronator drift.  Skin: Skin is warm and dry. No rash noted. She is not diaphoretic.  Psychiatric: She has a normal mood and affect. Her behavior is normal.     Results for orders placed in visit on 08/29/11  POCT CBC      Component Value Range   WBC 9.3  4.6 - 10.2 (K/uL)   Lymph, poc 2.2  0.6 - 3.4    POC LYMPH PERCENT 24.1  10 - 50 (%L)   MID (cbc) 0.5  0 - 0.9    POC MID % 5.5  0 -  12 (%M)   POC Granulocyte 6.5  2 - 6.9    Granulocyte percent 70.4  37 - 80 (%G)   RBC 4.61  4.04 - 5.48 (M/uL)   Hemoglobin 14.3  12.2 - 16.2 (g/dL)   HCT, POC 16.1  09.6 - 47.9 (%)   MCV 94.9  80 - 97 (fL)   MCH, POC 31.0  27 - 31.2 (pg)   MCHC 32.7  31.8 - 35.4 (g/dL)   RDW, POC 04.5     Platelet Count, POC 324  142 - 424 (K/uL)   MPV 7.8  0 - 99.8 (fL)  GLUCOSE, POCT (MANUAL RESULT ENTRY)      Component Value Range   POC Glucose 131     EKG: NSR, no acute findings, nonspecific st segments v4-5.    Assessment & Plan:  [name] is a 78 y.o. female 1. Dizziness  POCT CBC, EKG 12-Lead, POCT glucose (manual entry)  2. Headache  POCT glucose (manual entry)  3. Balance problem    4.  Lightheadedness  POCT CBC, EKG 12-Lead, POCT glucose (manual entry)   78 yo F with dizziness yesterday, generalized extremity weakness, and balance difficulty today. No focal weakness appreciated on exam.  No nystagmus. Options discussed for workup - including Emergency department, but would like to have outpatient workup.  Check CT without contrast today. Check BMP.   If negative, start asa 81 mg QD, trial meclizine 25mg  TID prn, and recheck in next 1 week, sooner if not improving as may need MRI or neuro eval.    Discussed 911/ER precautions including any new or worsening neurologic symptoms.  Understanding expressed.

## 2011-08-30 LAB — BASIC METABOLIC PANEL
CO2: 27 mEq/L (ref 19–32)
Calcium: 10 mg/dL (ref 8.4–10.5)
Potassium: 4.3 mEq/L (ref 3.5–5.3)
Sodium: 138 mEq/L (ref 135–145)

## 2011-08-31 ENCOUNTER — Ambulatory Visit: Payer: Medicare Other

## 2011-08-31 ENCOUNTER — Ambulatory Visit (INDEPENDENT_AMBULATORY_CARE_PROVIDER_SITE_OTHER): Payer: Medicare Other | Admitting: Family Medicine

## 2011-08-31 VITALS — BP 168/86 | HR 92 | Temp 98.1°F | Resp 16 | Wt 96.0 lb

## 2011-08-31 DIAGNOSIS — T148XXA Other injury of unspecified body region, initial encounter: Secondary | ICD-10-CM

## 2011-08-31 DIAGNOSIS — S61419A Laceration without foreign body of unspecified hand, initial encounter: Secondary | ICD-10-CM

## 2011-08-31 DIAGNOSIS — T07XXXA Unspecified multiple injuries, initial encounter: Secondary | ICD-10-CM | POA: Diagnosis not present

## 2011-08-31 DIAGNOSIS — S61409A Unspecified open wound of unspecified hand, initial encounter: Secondary | ICD-10-CM

## 2011-08-31 DIAGNOSIS — W19XXXA Unspecified fall, initial encounter: Secondary | ICD-10-CM

## 2011-08-31 NOTE — Patient Instructions (Addendum)
WOUND CARE Please return in 7 days to have your stitches/staples removed or sooner if you have concerns. Marland Kitchen Keep area clean and dry for 24 hours. Do not remove bandage, if applied. . After 24 hours, remove bandage and wash wound gently with mild soap and warm water. Reapply a new bandage after cleaning wound, if directed. . Continue daily cleansing with soap and water until stitches/staples are removed. . Do not apply any ointments or creams to the wound while stitches/staples are in place, as this may cause delayed healing. . Notify the office if you experience any of the following signs of infection: Swelling, redness, pus drainage, streaking, fever >101.0 F . Notify the office if you experience excessive bleeding that does not stop after 15-20 minutes of constant, firm pressure.   Return Monday to see Dr. Neva Seat as discussed for dizziness and Kennedy Bucker, PA-C for wound care.

## 2011-08-31 NOTE — Progress Notes (Signed)
Patient Name: [name] Date of Birth: [birth year] Medical Record Number: [id number] Gender: female Date of Encounter: 08/31/2011  History of Present Illness:  [name] is a 78 y.o. very pleasant female patient who presents with the following:  Went to open her screen door today and fell, sustaining multiple superficial injuries.  She did not pass out- she missed a step.  Cut her left knee, elbow and ankle, and her right hand.  Did not hit her head and feels ok except for her various skin tears and her sore left knee.  Last tetanus 2008  Patient Active Problem List  Diagnoses  . GERD (gastroesophageal reflux disease)  . Squamous cell skin cancer  . Urine, incontinence, stress female  . Osteoarthritis, hand   Past Medical History  Diagnosis Date  . Arthritis   . Cancer     squamous cell  . Incontinence of urine    Past Surgical History  Procedure Date  . Breast surgery 1981    breast  . Tonsillectomy   . Abdominal hysterectomy    History  Substance Use Topics  . Smoking status: Former Smoker -- 1.0 packs/day for 15 years    Types: Cigarettes    Quit date: 03/20/1977  . Smokeless tobacco: Not on file  . Alcohol Use: Not on file   Family History  Problem Relation Age of Onset  . Arthritis Mother   . Cancer Sister     breast   Allergies  Allergen Reactions  . Penicillin G Itching  . Septra (Bactrim)     Stomach concerns.    Medication list has been reviewed and updated.  Review of Systems: As per HPI- otherwise negative.  Physical Examination: Filed Vitals:   08/31/11 1700  BP: 168/86  Pulse: 92  Temp: 98.1 F (36.7 C)  TempSrc: Oral  Resp: 16  Weight: 96 lb (43.545 kg)    There is no height on file to calculate BMI.  GEN: WDWN, NAD, Non-toxic, A & O x 3 HEENT: Atraumatic, Normocephalic. Neck supple. No masses, No LAD. Ears and Nose: No external deformity. CV: RRR, No M/G/R. No JVD. No thrill. No extra heart sounds. PULM: CTA B,  no wheezes, crackles, rhonchi. No retractions. No resp. distress. No accessory muscle use. EXTR: No c/c/e NEURO Normal gait.  PSYCH: Normally interactive. Conversant. Not depressed or anxious appearing.  Calm demeanor.   There are multiple skin tears: left elbow, left knee, left ankle.  She has a skin tear and two superficial laceration on her right hand.  There is no numbness or weakness to suggest deeper structure involvement.  Her left knee is more sore than the other areas and she would like to have this x- rayed.  Otherwise her joints and bones are non- tender and have good movement- do not have suspicion of other fractures. Able to flex/ extend fully/ pronate and supinate her left elbow ok  [facility] reading (PRIMARY) by  Dr. Patsy Lager.  Negative knee  LEFT KNEE - COMPLETE 4+ VIEW  Comparison: None.  Findings: There is no acute bony or joint abnormality. Tricompartmental degenerative change is noted. No joint effusion.  IMPRESSION:  1. No acute finding. 2. Tricompartmental degenerative disease.  Assessment and Plan: 1. Fall    2. Contusion  DG Knee Complete 4 Views Left  3. Laceration of hand    4. Multiple skin tears     [name] was treated as per note by Kennedy Bucker PA- C for multiple skin tears  and 2 lacerations.  Her wounds were dressed and she was given wound care instructions.  She will follow- up on Monday as already planned- sooner if any other symptoms develop.

## 2011-08-31 NOTE — Progress Notes (Signed)
Procedure:  VCO  Lidocaine 2% plain injected locally to index finger wound and base of 3rd. Sterile field and prep. Index finger wound explored.  No tendon deficit noted.  Closed # 4 HM 5.0 Ethilon. Base of 3 rd wound explored.  Not deep.  Closed #3 HM and # 1 SI. Cleansed and dressed.  Skin tears on left lateral ankle, left knee and left elbow repaired with cleaning and steri strips.  Tear on right palm, flap debrided as it was not viable.  Xeroform applied and pressure bandage applied. Patient tolerated these procedures well.

## 2011-09-01 ENCOUNTER — Telehealth: Payer: Self-pay | Admitting: Family Medicine

## 2011-09-01 NOTE — Telephone Encounter (Signed)
Called to check on her- doing ok but her left elbow is hurting.  We will likely do an xray tomorrow. She does not want to come in today- prefers to wait till tomorrow

## 2011-09-01 NOTE — Telephone Encounter (Signed)
Message copied by Pearline Cables on Sun Sep 01, 2011  1:49 PM ------      Message from: Pearline Cables      Created: Sat Aug 31, 2011  9:22 PM       Check on her tomorrow

## 2011-09-02 ENCOUNTER — Ambulatory Visit: Payer: Medicare Other

## 2011-09-02 ENCOUNTER — Other Ambulatory Visit: Payer: Medicare Other

## 2011-09-02 ENCOUNTER — Ambulatory Visit (INDEPENDENT_AMBULATORY_CARE_PROVIDER_SITE_OTHER): Payer: Medicare Other | Admitting: Family Medicine

## 2011-09-02 VITALS — BP 131/52 | HR 76 | Temp 97.6°F | Resp 16 | Ht 60.78 in | Wt 97.4 lb

## 2011-09-02 DIAGNOSIS — M25559 Pain in unspecified hip: Secondary | ICD-10-CM

## 2011-09-02 DIAGNOSIS — IMO0002 Reserved for concepts with insufficient information to code with codable children: Secondary | ICD-10-CM

## 2011-09-02 DIAGNOSIS — R42 Dizziness and giddiness: Secondary | ICD-10-CM | POA: Diagnosis not present

## 2011-09-02 DIAGNOSIS — M25529 Pain in unspecified elbow: Secondary | ICD-10-CM | POA: Diagnosis not present

## 2011-09-02 DIAGNOSIS — R2689 Other abnormalities of gait and mobility: Secondary | ICD-10-CM

## 2011-09-02 DIAGNOSIS — R29818 Other symptoms and signs involving the nervous system: Secondary | ICD-10-CM | POA: Diagnosis not present

## 2011-09-02 DIAGNOSIS — L03114 Cellulitis of left upper limb: Secondary | ICD-10-CM

## 2011-09-02 DIAGNOSIS — R51 Headache: Secondary | ICD-10-CM | POA: Diagnosis not present

## 2011-09-02 MED ORDER — DOXYCYCLINE HYCLATE 100 MG PO TABS: 100.0000 mg | ORAL_TABLET | Freq: Two times a day (BID) | ORAL | Status: AC

## 2011-09-02 NOTE — Progress Notes (Signed)
Subjective:    Patient ID: [name], female    DOB: October 05, 1922, 78 y.o.   MRN: [id number]  HPI See prior office visits.  Seen 08/29/11 with 2 day hx dizziness, balance difficulty, generalized weakness.  Negative head CT except chronic changes.  Started on meclizine for possible inner ear/vertigo cause. Seen here 2 days ago after a fall - holding door open, felt door get away from her, and fell onto driveway.Dx. with multiple skin tears, and knee contusion. Index finger wound with 4 sutres, and 3rd finger wound with 4 sutures (3HM, 1 SI)  Skin tears on left lateral ankle, left knee and left elbow repaired with cleaning and steri strips.  Tear on right palm, flap debrided as it was not viable.  Xeroform applied and pressure bandage applied.    Not much energy in the morning.  Feels a little better during day.  Feels very sleepy with meclizine - feels like a hangover.  Head less heavy.  Not dizzy.  Has noticed occasional off balance times, but not as bad as before - only one episode.  No new symptoms other than lethargic. Didn't take this morning's dose. Feels better today - more energy.   Per phone note yesterday - L elbow pain.  Noticed more pain once got honme form office visit - hard to straighten arm  Yesterday am -- L hip pain.  Hurts to push on it but not to walk or move hip.  No recent fractures, but hx of osteopenia.     Review of Systems  Constitutional: Negative for fever.  Musculoskeletal: Positive for arthralgias.  Neurological: Positive for dizziness. Negative for weakness.       Objective:   Physical Exam  Constitutional: She is oriented to person, place, and time. She appears well-developed and well-nourished. No distress.  HENT:  Head: Normocephalic and atraumatic.  Eyes: EOM are normal. Pupils are equal, round, and reactive to light. Right eye exhibits no nystagmus. Left eye exhibits no nystagmus.       1mm more miotic R pupil  Cardiovascular: Normal rate, regular  rhythm, normal heart sounds and intact distal pulses.   No murmur heard. Pulmonary/Chest: Effort normal and breath sounds normal. No respiratory distress. She has no wheezes.  Musculoskeletal: Normal range of motion. She exhibits tenderness.       Left elbow: She exhibits laceration. She exhibits normal range of motion. tenderness found. Radial head and olecranon process tenderness noted.  Neurological: She is alert and oriented to person, place, and time.  Skin: Skin is warm and dry. Ecchymosis noted. There is erythema.          Erythema extending from olecranon skin tear along volar forearm to just proximal to wrist.   echymosis L buttocks laterally.  No bony ttp.  No pain with hip ROM.  Psychiatric: She has a normal mood and affect. Her behavior is normal. Judgment and thought content normal.    [facility] reading (PRIMARY) by  Dr. Neva Seat:  L elbow: ant and post fat pad. No discrete fx. Lhip: negative.    Assessment & Plan:  [name] is a 78 y.o. female . 1. Hip pain  DG Hip Complete Left  2. Elbow pain  DG Elbow 2 Views Left  3. Cellulitis of arm, left    4. Balance problem     L elbow pain, cellulitis - with wound, versus stasis/dependent edema/erythema with wound. Possible fx in elbow with fat pad on XR. Start doxycycline 100mg  BID -  SED.  Sling for comfort. Recheck erythema tomorrow with Porfirio Oar, PAC.  Wounds - skin tears, cleansed and redressed., and lacerations on R hand -  Discussed keeping open to air to breathe.  Continue xeroform gauze to open wound.  Recheck tomorrow.  L hip contusion.  No apparent fx or bony ttp.  Sx care. Avoid direct pressure to area.  XR to overread, rtc/er precautions.  Balance difficulty - subjectively improved, with possible side effects of meclizine.  No focal weakness or new neurologic symptoms.  Will stop meclizine, and recheck next 2 days to determine if MRI or neuro eval needed.  To ER sooner if any new or worsening symptoms. Can  continue asa 81mg  for now, but if any increased balance difficulty, or risk of fall may want to hold this.

## 2011-09-03 ENCOUNTER — Encounter: Payer: Self-pay | Admitting: Physician Assistant

## 2011-09-03 ENCOUNTER — Ambulatory Visit (INDEPENDENT_AMBULATORY_CARE_PROVIDER_SITE_OTHER): Payer: Medicare Other | Admitting: Physician Assistant

## 2011-09-03 VITALS — BP 122/68 | HR 83 | Temp 98.0°F | Resp 16 | Ht 61.5 in | Wt 98.4 lb

## 2011-09-03 DIAGNOSIS — IMO0002 Reserved for concepts with insufficient information to code with codable children: Secondary | ICD-10-CM

## 2011-09-03 DIAGNOSIS — M25529 Pain in unspecified elbow: Secondary | ICD-10-CM

## 2011-09-03 NOTE — Progress Notes (Signed)
  Subjective:    Patient ID: [name], female    DOB: 1922-10-29, 78 y.o.   MRN: [id number]  HPI Presents for re-evaluation of left elbow and multiple wounds after recent fall.  Elbow films revealed anterior and posterior fat pads, without visible fx.  She's not been wearing the sling, as it is difficult to apply to herself, but she has been taking it easy, only doing what she has to do.  No more dizziness.  She finds herself moving with much more caution than before.    Her elbow pain is better, but persists.  The wound on her right palm is still very sore.  She's tolerating the doxycycline without difficulty.   Review of Systems As above.    Objective:   Physical Exam VS noted. A&O. Pleasant and cheerful. Right hand wounds examined.  Sutured wounds are healing nicely.  Xeroform removed from palmar lesion after soaking.  No cellulitic changes.  Mild maceration noted after soaking.  Dried.  Mupirocin ointment applied and bandage applied. Left elbow wound remains with steri-strips.  Mild erythema and moderate edema noted along the dependent aspect of the forearm, but less than yesterday.  No increased warmth.  No drainage from the elbow wound.  FROM. Bandage applied to elbow. Left ankle wound and left knee wound both healing well.     Assessment & Plan:   1. Pain in joint, upper arm   2. Cellulitis and abscess of upper arm and forearm    Continue local wound care. Continue doxycycline. Re-evaluate with Dr. Neva Seat tomorrow as planned.

## 2011-09-04 ENCOUNTER — Ambulatory Visit (INDEPENDENT_AMBULATORY_CARE_PROVIDER_SITE_OTHER): Payer: Medicare Other | Admitting: Family Medicine

## 2011-09-04 ENCOUNTER — Encounter: Payer: Self-pay | Admitting: Family Medicine

## 2011-09-04 VITALS — BP 135/74 | HR 87 | Temp 98.7°F | Resp 18 | Ht 61.5 in | Wt 97.6 lb

## 2011-09-04 DIAGNOSIS — R29818 Other symptoms and signs involving the nervous system: Secondary | ICD-10-CM

## 2011-09-04 DIAGNOSIS — M25529 Pain in unspecified elbow: Secondary | ICD-10-CM

## 2011-09-04 DIAGNOSIS — S51009A Unspecified open wound of unspecified elbow, initial encounter: Secondary | ICD-10-CM | POA: Diagnosis not present

## 2011-09-04 DIAGNOSIS — R2689 Other abnormalities of gait and mobility: Secondary | ICD-10-CM

## 2011-09-04 NOTE — Progress Notes (Signed)
  Subjective:    Patient ID: [name], female    DOB: 07/23/1922, 78 y.o.   MRN: [id number]  HPI [name] is a 78 y.o. female See prior ov's, initially seen for dizziness, balance difficulty. See note by Porfirio Oar, PAC regarding wounds from prior fall.  No further dizziness.  Not feeling like is walking as aggressively,  Does not necessarily feel of balance, but just feels tired. Sore in bruised areas.  Still feels "blah" in morning, then improves during day.,  L elbow - possible occult radial head fx.  No pain with mvmt, but sore if bent or pressure on area. Wearing sling in evening.  Redness resolving in L arm with taking doxycycline.  No side effects.     Review of Systems     Objective:   Physical Exam  Constitutional: She is oriented to person, place, and time. She appears well-developed and well-nourished. No distress.  Eyes: Pupils are equal, round, and reactive to light. Right eye exhibits no nystagmus. Left eye exhibits no nystagmus.       R pupil 1mm miotic vs. L.  Cardiovascular: Normal rate.   Neurological: She is alert and oriented to person, place, and time.       Negative romberg and no pronator drift.  Normal gait, nonfocal exam.  Skin:       See other note.  Psychiatric: She has a normal mood and affect. Her behavior is normal.    Full ROM of L elbow. Healing wound on extensor surface.      Assessment & Plan:  [name] is a 78 y.o. female  Balance difficulty - with prior HA, dizziness, nonfocal exam now, and by .  Refer to neuro for eval for any necessary further workup.  Cont ASA 81mg  QD for now, RTC/ER/911 stroke precautions.  L elbow wound with possible radial head fx- occult.  Moving well. Minimal discomfort.  Cont ROM as tolerated, sling for comfort or ribbon to R shoulder to allow rest, and recheck with XR in 1 week.  RTC 2 days  For wound recheck, SR. RTC precautions.

## 2011-09-04 NOTE — Progress Notes (Signed)
Subjective: Wound re-evaluation.  Recall recent fall.  Questionable left elbow fracture.  Not using the sling since it gets in her way and is difficult to put on herself.  Objective: Left Elbow wound is healing well.  Dry.  No edema, erythema.  Ecchymosis at the olecranon is resolving as expected. Erythema and edema along the dependent aspect of the left forearm is nearly resolved. She has full ROM of the elbow without significant pain. The olecranon remains mildly tender. Left lateral Knee.  She has removed the SteriStrips.  The wound is dry.  No edema, erythema. Well-healed. Left lateral Ankle.  Bandage removed.  Wound with mild maceration.  No edema, erythema. Cleansed with saline and gauze. Right Palm. Bandage removed. Wound wet and macerated.  Small exudate cleaned with saline and gauze. Right hand/fingers.  Sutured wounds are healing nicely.  No erythema, edema, drainage.  Today she also asked for evaluation of a wound on the right anterior tibia.  This was a wound prior to her recent fall and has been cared for by her PCP for several weeks now.  She developed a cellulitis and was given antibiotic ointment to apply daily with dressing changes.   Dressing removed. Yellow, wet drainage. Cleansed with gauze and saline. Macerated.  No erythema.  Mild edema and stasis skin changes noted.  Assessment: Skin wounds, s/p fall.   Wound, complicated by cellulitis, right anterior tibia. Questionable left elbow fracture.  Plan: I think she's been doing a great job with dressing changes, but that now these wounds need some air.  The maceration is concerning, but I don't appreciate any new cellulitis.  The right anterior tibia wound is the worst at this point, and if it's not significantly better with some breathing room, I'd consider using Panafil on that wound to promote healing. She's scheduled to return for suture removal on 09/06/2011 and I'll reassess then.

## 2011-09-04 NOTE — Patient Instructions (Signed)
Recheck with Chelle in 2 days, and recheck with Dr. Neva Seat Wednesday 22nd anytime between 10am and 4 pm. Return to the clinic or go to the nearest emergency room if any of your symptoms worsen or new symptoms occur.

## 2011-09-06 ENCOUNTER — Ambulatory Visit (INDEPENDENT_AMBULATORY_CARE_PROVIDER_SITE_OTHER): Payer: Medicare Other | Admitting: Physician Assistant

## 2011-09-06 VITALS — BP 128/60 | HR 71 | Temp 98.1°F | Resp 16 | Ht 62.25 in | Wt 97.6 lb

## 2011-09-06 DIAGNOSIS — S61409A Unspecified open wound of unspecified hand, initial encounter: Secondary | ICD-10-CM

## 2011-09-06 DIAGNOSIS — S51009A Unspecified open wound of unspecified elbow, initial encounter: Secondary | ICD-10-CM

## 2011-09-06 DIAGNOSIS — S5000XA Contusion of unspecified elbow, initial encounter: Secondary | ICD-10-CM

## 2011-09-06 DIAGNOSIS — S81809A Unspecified open wound, unspecified lower leg, initial encounter: Secondary | ICD-10-CM

## 2011-09-06 DIAGNOSIS — S91009A Unspecified open wound, unspecified ankle, initial encounter: Secondary | ICD-10-CM

## 2011-09-06 NOTE — Progress Notes (Signed)
  Subjective:    Patient ID: [name], female    DOB: 1922-07-14, 78 y.o.   MRN: [id number]  HPI Presents for suture removal and re-assessment of wounds on the ankle, hand and elbow.  Overall is doing well.  Had some bleeding from the wound on the right palm, so has been keeping it covered.  The ankle, elbow and tib-fib wounds have done well uncovered.  She reports that the left elbow is still very sore, and she has developed some left shoulder pain, but is improving overall.  Still not using the sling because it gets in her way and keeps her from doing her own ADLs.     Review of Systems As above.    Objective:   Physical Exam  The uncovered wounds are now dry.  No erythema, edema or drainage.  The sutures are removed without incident.  The right palmar wound edges are macerated.  No increased erythema, no edema.  Very tender.  The olecranon is tender, but all the edema and erythema along the forearm is gone.      Assessment & Plan:  1) Wounds: Right palm-wash daily with soap and water.  Apply thin layer of antibiotic and bandage temporarily prn bleeding, but otherwise leave open to air. Re-evaluate 5/22. Right fingers-Sutures removed.   Left elbow-Healing well. Left ankle-Healing well. Right Tib-Fib-healing well.  2) Contusion, elbow: Continue to rest.  Sling for comfort, but since she has no pain with ROM, she is unlikely to wear it.  Re-check with xray 5/22 as planned.

## 2011-09-11 ENCOUNTER — Ambulatory Visit (INDEPENDENT_AMBULATORY_CARE_PROVIDER_SITE_OTHER): Payer: Medicare Other | Admitting: Family Medicine

## 2011-09-11 ENCOUNTER — Ambulatory Visit: Payer: Medicare Other

## 2011-09-11 VITALS — BP 118/52 | HR 78 | Temp 98.1°F | Resp 18 | Ht 61.0 in | Wt 96.2 lb

## 2011-09-11 DIAGNOSIS — M79603 Pain in arm, unspecified: Secondary | ICD-10-CM

## 2011-09-11 DIAGNOSIS — M25529 Pain in unspecified elbow: Secondary | ICD-10-CM

## 2011-09-11 DIAGNOSIS — M79609 Pain in unspecified limb: Secondary | ICD-10-CM | POA: Diagnosis not present

## 2011-09-11 DIAGNOSIS — T1490XA Injury, unspecified, initial encounter: Secondary | ICD-10-CM

## 2011-09-11 DIAGNOSIS — R29818 Other symptoms and signs involving the nervous system: Secondary | ICD-10-CM | POA: Diagnosis not present

## 2011-09-11 DIAGNOSIS — R2689 Other abnormalities of gait and mobility: Secondary | ICD-10-CM

## 2011-09-11 NOTE — Progress Notes (Signed)
Subjective:    Patient ID: [name], female    DOB: March 01, 1923, 78 y.o.   MRN: [id number]  HPI [name] is a 78 y.o. female See multiple prior OV's - s/p fall 08/31/11. With multiple skin tears and lacs - R hand and palmar wound - R hand  L elbow pain - post fall 08/31/11.  XR 5/13 without definite fx, but fatpads seen, suggesting occult fx.  Sling for comfort given - still some soreness over wound in elbow but not as bad as before.  Now noticed more soreness in upper L arm and shoulder.  Able to move without difficulty but just aches.  On 1 more day of antibiotic.  R hand wound - still sore with pressure in area, but no bony pain.  Still hurts on bruised area on side of hip/buttocks. No pain in hips with weight bearing.    Still feels like balance is off.  See prior ov's - CT head without acute findings - chronic changes only on 08/29/11.  Discussed neuro eval prior, but declined. Doesn't feel like walking normally, but not to one side as initally noted at 08/29/11.  Feels like worse in am.  Improves during day. No focal weakness. No worsening - just persistent.  Review of Systems  Constitutional: Negative for fever and chills.  Skin: Negative for rash.  Neurological: Negative for dizziness, tremors, weakness and light-headedness.  also per HPI.    Objective:   Physical Exam  Constitutional: She is oriented to person, place, and time. She appears well-developed and well-nourished.  HENT:  Head: Normocephalic and atraumatic.  Eyes: Conjunctivae and EOM are normal. Pupils are equal, round, and reactive to light. Right eye exhibits no nystagmus. Left eye exhibits no nystagmus.  Pulmonary/Chest: Effort normal.  Musculoskeletal: Normal range of motion.       Left elbow: Normal. She exhibits normal range of motion, no swelling, no effusion, no deformity and no laceration.       Left upper arm: She exhibits tenderness and bony tenderness. She exhibits no swelling, no edema, no  deformity and no laceration.       Arms: Neurological: She is alert and oriented to person, place, and time. She has normal strength. No sensory deficit. She displays a negative Romberg sign.       No pronator drift, able to heel-toe walk without apparent difficulty.  Non focal exam.  Skin: Skin is warm and dry. Abrasion noted. No rash noted. No erythema.      [facility] reading (PRIMARY) by  Dr. Neva Seat: L elbow - lessening of fat pads, no discrete fx L humerus - negative L shoulder - mild djd - at [facility] and AC joints.     Assessment & Plan:  [name] is a 78 y.o. female  L elbow - to upper arm/shoulder pain.  FROM.  Healing wounds.  No apparent fx.  rom as tolerated.  Return   Balance difficulty - persistent. Initially had dizziness and feeling of leaning left with ambulation on 08/29/11 office visit.  Head CT negative for acute findings on 08/29/11.  On ASA 81mg  QD.  Due to persistence of symptoms, will refer to neuro to decide if further eval/imaging needed.    Wounds - R palm, R leg, L ankle - all improving with contraction around wounds.  Continue wound care with soap/water, and watch for any return of erythema as antibiotics are finishing.   Recheck in 2 weeks - sooner or to ER if  any worsening.

## 2011-09-24 DIAGNOSIS — M25579 Pain in unspecified ankle and joints of unspecified foot: Secondary | ICD-10-CM | POA: Diagnosis not present

## 2011-09-24 DIAGNOSIS — M1710 Unilateral primary osteoarthritis, unspecified knee: Secondary | ICD-10-CM | POA: Diagnosis not present

## 2011-09-27 ENCOUNTER — Ambulatory Visit (INDEPENDENT_AMBULATORY_CARE_PROVIDER_SITE_OTHER): Payer: Medicare Other | Admitting: Family Medicine

## 2011-09-27 ENCOUNTER — Encounter: Payer: Self-pay | Admitting: Family Medicine

## 2011-09-27 VITALS — BP 141/63 | HR 88 | Temp 97.9°F | Resp 20 | Ht 61.0 in | Wt 94.4 lb

## 2011-09-27 DIAGNOSIS — S81809A Unspecified open wound, unspecified lower leg, initial encounter: Secondary | ICD-10-CM

## 2011-09-27 DIAGNOSIS — H9190 Unspecified hearing loss, unspecified ear: Secondary | ICD-10-CM

## 2011-09-27 DIAGNOSIS — S91009A Unspecified open wound, unspecified ankle, initial encounter: Secondary | ICD-10-CM

## 2011-09-27 DIAGNOSIS — M25529 Pain in unspecified elbow: Secondary | ICD-10-CM

## 2011-09-27 DIAGNOSIS — S61409A Unspecified open wound of unspecified hand, initial encounter: Secondary | ICD-10-CM

## 2011-09-27 NOTE — Progress Notes (Signed)
  Subjective:    Patient ID: [name], female    DOB: [birth year], 78 y.o.   MRN: [id number]  HPI [name] is a 78 y.o. female Follow up falls - see prior ov's.  Fall 08/31/11.   L elbow - to upper arm/shoulder pain.  FROM.  Healing wounds.  No apparent fx. --> rom as tolerated at [facility] 09/11/11.  Moving elbow ok.  No pain.  Doing all normal activities, including workout routine.  Balance difficulty - persistent. Initially had dizziness and feeling of leaning left with ambulation on 08/29/11 office visit.  Head CT negative for acute findings on 08/29/11.  On ASA 81mg  QD.  Due to persistence of symptoms, will referred  to neuro to decide if further eval/imaging needed.  Appt.  June 17th.  Thinks may just be afraid of falling.    L knee pain - past few days.  Saw Dr. Luiz Blare few days ago.  Told was arthritis.  corrtisone shot.   Wounds - R palm, R leg, L ankle - all improving last ov. Continued  wound care with soap/water. All doing well.  Skin tear on L leg - few days ago getting out of bed.  Has been putting abx ointment on this and covering with bandage.    Difficulty hearing  - more difficulty past 6 months.  Would like Rx faxed for hearing checked.    Review of Systems  Musculoskeletal: Negative for arthralgias.  Skin: Positive for wound.   As above.      Objective:   Physical Exam  Constitutional: She is oriented to person, place, and time. She appears well-developed and well-nourished.  HENT:  Head: Normocephalic and atraumatic.  Pulmonary/Chest: Effort normal.  Musculoskeletal:       Left elbow: Normal. She exhibits normal range of motion, no swelling and no effusion. no tenderness found.  Neurological: She is alert and oriented to person, place, and time.  Skin: Skin is warm and dry.  Psychiatric: She has a normal mood and affect. Her behavior is normal.    R hand wounds - healed with dry skin at periphery.  No redness.  Scab over R medial and L lateral leg with  peripheral contraction.  No erythema .   skin tear -  L medial leg approximately 3-4 cm without surrounding erythema or exudate. flap well approximated at periphery     Assessment & Plan:  [name] is a 78 y.o. female 1. Wound, open, hand with or without fingers   2. Wound, open, leg   3. Elbow pain   4. Hearing difficulty    Wounds:  Hands,  R leg, left ankle  - healing well. otc vit e lotion ok. rtc precautions.  Wound, skin tear - inside l leg - no current signs of infection. Continue to keep clean and covered with loose bandage. return to clinic precautions.  Hearing difficulty - faxed note to [facility] for testing/eval/tx., as symptoms have been progressive past 6 months.  Cont follow up with neuro for dizziness.  Return to clinic or ER if any acute worsening of symptoms

## 2011-10-07 DIAGNOSIS — R42 Dizziness and giddiness: Secondary | ICD-10-CM | POA: Diagnosis not present

## 2011-11-05 DIAGNOSIS — M25519 Pain in unspecified shoulder: Secondary | ICD-10-CM | POA: Diagnosis not present

## 2011-11-05 DIAGNOSIS — M1710 Unilateral primary osteoarthritis, unspecified knee: Secondary | ICD-10-CM | POA: Diagnosis not present

## 2011-11-05 DIAGNOSIS — M25569 Pain in unspecified knee: Secondary | ICD-10-CM | POA: Diagnosis not present

## 2011-11-28 ENCOUNTER — Ambulatory Visit (INDEPENDENT_AMBULATORY_CARE_PROVIDER_SITE_OTHER): Payer: Medicare Other | Admitting: Family Medicine

## 2011-11-28 ENCOUNTER — Encounter: Payer: Self-pay | Admitting: Family Medicine

## 2011-11-28 VITALS — BP 120/60 | Temp 97.7°F | Wt 96.0 lb

## 2011-11-28 DIAGNOSIS — R627 Adult failure to thrive: Secondary | ICD-10-CM | POA: Diagnosis not present

## 2011-11-28 DIAGNOSIS — L729 Follicular cyst of the skin and subcutaneous tissue, unspecified: Secondary | ICD-10-CM

## 2011-11-28 DIAGNOSIS — L723 Sebaceous cyst: Secondary | ICD-10-CM | POA: Diagnosis not present

## 2011-11-28 DIAGNOSIS — R269 Unspecified abnormalities of gait and mobility: Secondary | ICD-10-CM

## 2011-11-28 DIAGNOSIS — R29818 Other symptoms and signs involving the nervous system: Secondary | ICD-10-CM | POA: Diagnosis not present

## 2011-11-28 DIAGNOSIS — R2689 Other abnormalities of gait and mobility: Secondary | ICD-10-CM

## 2011-11-28 LAB — VITAMIN B12: Vitamin B-12: 453 pg/mL (ref 211–911)

## 2011-11-28 NOTE — Progress Notes (Signed)
  Subjective:    Patient ID: QUANTINA DERSHEM, female    DOB: May 19, 1922, 78 y.o.   MRN: [id number]  HPI  Patient seen for followup. Had problems with poor weight gain for several months. She had significant fall back in May. Seen at urgent care clinic and prescribed meclizine and is not sure if this is related but fell afterwards. She had no lacerations. No residual injury. CT head unremarkable. Neurology referral and apparently no abnormality noted. She denies headache. No reported head injury. She has sometimes fear of falling but is exercising regularly and walking, sometimes feeling off balance at times. No vertigo. No focal weakness.   Painful cystic lesion left arm. Posterior distal arm. Noted weeks ago. No change in size.  Past Medical History  Diagnosis Date  . Arthritis   . Cancer     squamous cell  . Incontinence of urine    Past Surgical History  Procedure Date  . Breast surgery 1981    breast  . Tonsillectomy   . Abdominal hysterectomy     reports that she quit smoking about 34 years ago. Her smoking use included Cigarettes. She has a 15 pack-year smoking history. She does not have any smokeless tobacco history on file. Her alcohol and drug histories not on file. family history includes Arthritis in her mother and Cancer in her sister. Allergies  Allergen Reactions  . Penicillin G Itching  . Septra (Bactrim)     Stomach concerns.       Review of Systems  Constitutional: Negative for fever, chills, appetite change, fatigue and unexpected weight change.  Respiratory: Negative for cough and shortness of breath.   Cardiovascular: Negative for chest pain, palpitations and leg swelling.  Gastrointestinal: Negative for abdominal pain.  Genitourinary: Negative for dysuria.  Neurological: Positive for dizziness. Negative for headaches.  Psychiatric/Behavioral: Negative for confusion.       Objective:   Physical Exam  Constitutional: She is oriented to person, place,  and time. She appears well-developed and well-nourished.  Neck: Neck supple. No thyromegaly present.  Cardiovascular: Normal rate and regular rhythm.   Pulmonary/Chest: Breath sounds normal. No respiratory distress. She has no wheezes. She has no rales.  Musculoskeletal: She exhibits no edema.  Lymphadenopathy:    She has no cervical adenopathy.  Neurological: She is alert and oriented to person, place, and time. She has normal reflexes. No cranial nerve deficit.       Patient has a fairly steady gait. Somewhat short steps but no difficulty ambulating without assistance. No cogwheel rigidity.  Skin:       Patient small approximately 6 mm cystic mobile lesion left posterior arm. No surrounding erythema. No fluctuance.          Assessment & Plan:  Poor weight gain. Weight is actually stable. Appetite per patient is improving. No depression issues. Previous labs including thyroid function normal.  Discussed options such as Remeron but she is not depressed.  Reported unsteady gait at times. She is ambulating fairly well today. No evidence for numbness or peripheral weakness. No history of anemia. Check B12 level. Discussed possible physical therapy referral for fall risk reduction and patient will consider  Small cystic lesion left posterior arm. Suspect benign. Reassurance. Excise if this becomes more painful

## 2012-01-01 ENCOUNTER — Other Ambulatory Visit: Payer: Self-pay | Admitting: Family Medicine

## 2012-01-01 DIAGNOSIS — Z1231 Encounter for screening mammogram for malignant neoplasm of breast: Secondary | ICD-10-CM

## 2012-01-03 ENCOUNTER — Ambulatory Visit (INDEPENDENT_AMBULATORY_CARE_PROVIDER_SITE_OTHER): Payer: Medicare Other | Admitting: Family Medicine

## 2012-01-03 DIAGNOSIS — Z23 Encounter for immunization: Secondary | ICD-10-CM

## 2012-01-20 ENCOUNTER — Ambulatory Visit (INDEPENDENT_AMBULATORY_CARE_PROVIDER_SITE_OTHER): Payer: Medicare Other | Admitting: Family Medicine

## 2012-01-20 VITALS — BP 112/70 | HR 75 | Temp 98.2°F | Resp 16 | Ht 61.0 in | Wt 95.0 lb

## 2012-01-20 DIAGNOSIS — L0291 Cutaneous abscess, unspecified: Secondary | ICD-10-CM | POA: Diagnosis not present

## 2012-01-20 DIAGNOSIS — L0390 Cellulitis, unspecified: Secondary | ICD-10-CM

## 2012-01-20 LAB — POCT CBC
Hemoglobin: 13.4 g/dL (ref 12.2–16.2)
MCH, POC: 30.3 pg (ref 27–31.2)
MCHC: 31.6 g/dL — AB (ref 31.8–35.4)
MID (cbc): 0.7 (ref 0–0.9)
MPV: 8.4 fL (ref 0–99.8)
POC Granulocyte: 4.5 (ref 2–6.9)
POC MID %: 10 %M (ref 0–12)
Platelet Count, POC: 288 10*3/uL (ref 142–424)
RBC: 4.42 M/uL (ref 4.04–5.48)
WBC: 7.4 10*3/uL (ref 4.6–10.2)

## 2012-01-20 MED ORDER — DOXYCYCLINE HYCLATE 100 MG PO TABS: 100.0000 mg | ORAL_TABLET | Freq: Two times a day (BID) | ORAL | Status: DC

## 2012-01-20 NOTE — Progress Notes (Signed)
S: Right ankle swelling for 5 days.  NKI, but bumps things.  Hx dry flaky skin  O: 1+edema right foot.  Erythema.  Several old crusted areas. Tender and red dorsum of foot.    Results for orders placed in visit on 01/20/12  POCT CBC      Component Value Range   WBC 7.4  4.6 - 10.2 K/uL   Lymph, poc 2.2  0.6 - 3.4   POC LYMPH PERCENT 29.5  10 - 50 %L   MID (cbc) 0.7  0 - 0.9   POC MID % 10.0  0 - 12 %M   POC Granulocyte 4.5  2 - 6.9   Granulocyte percent 60.5  37 - 80 %G   RBC 4.42  4.04 - 5.48 M/uL   Hemoglobin 13.4  12.2 - 16.2 g/dL   HCT, POC 16.1  09.6 - 47.9 %   MCV 95.9  80 - 97 fL   MCH, POC 30.3  27 - 31.2 pg   MCHC 31.6 (*) 31.8 - 35.4 g/dL   RDW, POC 04.5     Platelet Count, POC 288  142 - 424 K/uL   MPV 8.4  0 - 99.8 fL   A: Cellulitis Edema  P: Elevate.   Doxy. If at all worse or not improving 2-3 days rtc.

## 2012-01-20 NOTE — Patient Instructions (Addendum)
Take medicine as directed

## 2012-01-23 ENCOUNTER — Ambulatory Visit (HOSPITAL_COMMUNITY): Payer: Medicare Other

## 2012-01-24 ENCOUNTER — Telehealth: Payer: Self-pay

## 2012-01-24 MED ORDER — CLINDAMYCIN HCL 300 MG PO CAPS: 300.0000 mg | ORAL_CAPSULE | Freq: Three times a day (TID) | ORAL | Status: DC

## 2012-01-24 NOTE — Telephone Encounter (Signed)
Patient not taking Doxycycline because of warning about dairy. I have tried to help her, I can not. I am going to have Chelle speak to her.

## 2012-01-24 NOTE — Telephone Encounter (Signed)
I have spoken to [facility] then to patient, I have had a hard time communicating effectively with this patient about dairy and Doxy. She is going to stop her meds and I have advised per Chelle we can send alternative meds in for her to [facility], she has allergy to Septra so Cleocin is sent in.

## 2012-01-24 NOTE — Telephone Encounter (Signed)
PT HAS PROBLEM WITH ANTIBIOTIC PRESCRIBED AND WOULD LIKE ALTERNATIVE    PT PH [phone]  [facility] [address]

## 2012-02-12 ENCOUNTER — Ambulatory Visit (HOSPITAL_COMMUNITY)
Admission: RE | Admit: 2012-02-12 | Discharge: 2012-02-12 | Disposition: A | Discharge status: Home / Self Care | Payer: Medicare Other | Source: Ambulatory Visit | Attending: Family Medicine | Admitting: Family Medicine

## 2012-02-12 DIAGNOSIS — Z1231 Encounter for screening mammogram for malignant neoplasm of breast: Secondary | ICD-10-CM | POA: Insufficient documentation

## 2012-02-14 ENCOUNTER — Ambulatory Visit (INDEPENDENT_AMBULATORY_CARE_PROVIDER_SITE_OTHER): Payer: Medicare Other | Admitting: Family Medicine

## 2012-02-14 ENCOUNTER — Encounter: Payer: Self-pay | Admitting: Family Medicine

## 2012-02-14 ENCOUNTER — Other Ambulatory Visit: Payer: Self-pay | Admitting: Family Medicine

## 2012-02-14 VITALS — BP 120/54 | Temp 98.0°F | Wt 99.0 lb

## 2012-02-14 DIAGNOSIS — M25473 Effusion, unspecified ankle: Secondary | ICD-10-CM

## 2012-02-14 DIAGNOSIS — I998 Other disorder of circulatory system: Secondary | ICD-10-CM | POA: Diagnosis not present

## 2012-02-14 DIAGNOSIS — R58 Hemorrhage, not elsewhere classified: Secondary | ICD-10-CM

## 2012-02-14 DIAGNOSIS — L989 Disorder of the skin and subcutaneous tissue, unspecified: Secondary | ICD-10-CM

## 2012-02-14 DIAGNOSIS — R609 Edema, unspecified: Secondary | ICD-10-CM | POA: Diagnosis not present

## 2012-02-14 NOTE — Progress Notes (Signed)
  Subjective:    Patient ID: [name], female    DOB: 22-Jul-1922, 78 y.o.   MRN: [id number]  HPI  Patient here for several items  Reportedly had recent cellulitis right foot. She treated with doxycycline. Redness has improved. No fevers or chills. She has some mild bilateral ankle edema. No dyspnea. No orthopnea. Still exercising without problems. Not doing daily weights. No history of heart failure.  Bruise area mid anterior chest. Nontender. Does not recall injury.  Patient has couple lesions left lower extremity. Present for several weeks. Prior history squamous cell cancer. Appt with derm in January.  Past Medical History  Diagnosis Date  . Arthritis   . Cancer     squamous cell  . Incontinence of urine    Past Surgical History  Procedure Date  . Breast surgery 1981    breast  . Tonsillectomy   . Abdominal hysterectomy     reports that she quit smoking about 34 years ago. Her smoking use included Cigarettes. She has a 15 pack-year smoking history. She does not have any smokeless tobacco history on file. Her alcohol and drug histories not on file. family history includes Arthritis in her mother and Cancer in her sister. Allergies  Allergen Reactions  . Penicillin G Itching  . Septra (Bactrim)     Stomach concerns.       Review of Systems  Constitutional: Negative for chills, appetite change and unexpected weight change.  Respiratory: Negative for cough and shortness of breath.   Cardiovascular: Positive for leg swelling. Negative for chest pain and palpitations.  Gastrointestinal: Negative for abdominal pain.  Genitourinary: Negative for dysuria.  Neurological: Negative for dizziness, syncope and weakness.  Hematological: Negative for adenopathy.  Psychiatric/Behavioral: Negative for confusion.       Objective:   Physical Exam  Constitutional: She is oriented to person, place, and time. She appears well-developed and well-nourished.  HENT:  Right Ear:  External ear normal.  Left Ear: External ear normal.  Mouth/Throat: Oropharynx is clear and moist.  Neck: Neck supple.  Cardiovascular: Normal rate and regular rhythm.   Pulmonary/Chest: Effort normal and breath sounds normal. No respiratory distress. She has no wheezes. She has no rales.  Musculoskeletal:       Trace nonpitting edema lower legs and ankles bilaterally.  Neurological: She is alert and oriented to person, place, and time.  Skin:       Patient has hyperkeratotic lesion left lower medial leg was somewhat nodular base. She has scabbed area about 1 cm diameter left anterior proximal leg.   patient small area of ecchymosis 1 cm diameter mid anterior chest. Nontender.          Assessment & Plan:  #1 trace leg and ankle edema. Normal exam. Clinically, no suspicion for CHF. Suspect some mild venous stasis. Recommend compression and elevation.  #2 skin lesions left lower extremity concerning for possible squamous cell cancer. She is encouraged to move her dermatology appointment from January up  #3 benign small area of ecchymosis anterior chest

## 2012-02-14 NOTE — Telephone Encounter (Signed)
Pt will need new rx nexium 40mg   #90 with 3 refills fax to [facility] [phone]. Please fax rx in jan 2014

## 2012-02-14 NOTE — Patient Instructions (Addendum)
Try to move up appointment with dermatologist regarding left leg lesions.

## 2012-02-19 DIAGNOSIS — D485 Neoplasm of uncertain behavior of skin: Secondary | ICD-10-CM | POA: Diagnosis not present

## 2012-02-19 DIAGNOSIS — C44721 Squamous cell carcinoma of skin of unspecified lower limb, including hip: Secondary | ICD-10-CM | POA: Diagnosis not present

## 2012-02-19 DIAGNOSIS — L578 Other skin changes due to chronic exposure to nonionizing radiation: Secondary | ICD-10-CM | POA: Diagnosis not present

## 2012-02-20 ENCOUNTER — Telehealth: Payer: Self-pay | Admitting: Family Medicine

## 2012-02-20 NOTE — Telephone Encounter (Signed)
Pt is still very agitated that it took so long for a response.  She was given Dr Lucie Leather instructions and explaination but refused to do only liquids.  She will drink her usual fluids.  Pt refused to come for OV tomorrow as she has appt scheduled in March.  I asked pt if I could call her tomorrow and she said OK.

## 2012-02-20 NOTE — Telephone Encounter (Signed)
Pt calling back for response, this note was never routed to Dr Caryl Never from [facility].

## 2012-02-20 NOTE — Telephone Encounter (Signed)
I recommend symptomatic treatment at this point with clear liquids such as clear soups and bland diet until loose stools resolving. Even though this probably does not represent C. difficile colitis, patient did have recent antibiotic from urgent care. Therefore, I would not recommend antidiarrhea prescription such as Lomotil at this time. Using antidiarrhea medication such as Lomotil until etiology clarified could be dangerous. Recommend evaluation tomorrow if she is having this frequency of stools. Continue plenty of fluids to avoid dehydration. If she develops any fever, recurrent vomiting, or severe abdominal pain go to emergency department.

## 2012-02-20 NOTE — Telephone Encounter (Signed)
Caller: [name]/Patient; Patient Name: [name]; PCP: Evelena Peat [facility]); [facility] Phone Number: [phone]; Reason for call: Other; Is having multiple stools - 10 least  but not diarrhea. first stool 02/20/12 at 08:30.  Thinks it was something she ate yesterday. No fever or nausea; Regular bowel movement until yesterday; Just the multiple stools today,  Denies that it is diarrhea states it is her normal "poop" just multiple stools.  She can't get done and cleaned before she has to go again.  Has had similar problem, but states she has never had it like this before.  Takes Imodium normally and has had her usual today.  Is drinking fluids, but have avoided foods though now she says she feels hungry.  Triaged using Diarrhea or other changes in stool with a disposition to see provider within two weeks due to change in character of bowel movements.  Care advice given with caller demonstrating understanding.  Patient is requesting for something to be called in the help her stop.  [facility] [phone]  OFFICE:  PLEASE FOLLOW UP WITH PATIENT REGARDING CALLING IN SOMETHING TO HELP PATIENT SLOW DOWN THE NUMBER OF STOOLS SHE IS HAVING TODAY.  THANKS

## 2012-02-21 ENCOUNTER — Ambulatory Visit (INDEPENDENT_AMBULATORY_CARE_PROVIDER_SITE_OTHER): Payer: Medicare Other | Admitting: Family Medicine

## 2012-02-21 ENCOUNTER — Encounter: Payer: Self-pay | Admitting: Family Medicine

## 2012-02-21 ENCOUNTER — Telehealth: Payer: Self-pay | Admitting: Family Medicine

## 2012-02-21 VITALS — BP 120/60 | Temp 97.9°F | Wt 100.0 lb

## 2012-02-21 DIAGNOSIS — R197 Diarrhea, unspecified: Secondary | ICD-10-CM

## 2012-02-21 MED ORDER — ESOMEPRAZOLE MAGNESIUM 40 MG PO CPDR: 40.0000 mg | DELAYED_RELEASE_CAPSULE | Freq: Every day | ORAL | Status: DC

## 2012-02-21 NOTE — Telephone Encounter (Signed)
Caller: [name]/Patient; Patient Name: [name]; PCP: Evelena Peat [facility]); [facility] Phone Number: [phone];   Patient states she spoke with a nurse 02/20/12 regarding diarrhea and received a return call with diet instructions. Patient states she has additional questions regarding diarrhea. Patient states she developed diarrhea, onset 02/20/12. States loose, small bowel movement X 2 02/21/12. States stools became mucousy 02/21/12. Denies any bleeding. Patient taking fluids well. Urinating normally for patient. Afebrile. Denies abdominal Pena. RN reviewed Counselling psychologist Health Record. Documentation noted from Dr. Caryl Never 02/20/12: I recommend symptomatic treatment at this point with clear liquids such as clear soups and bland diet until loose stools resolving. Even though this probably does not represent C. difficile colitis, patient did have recent antibiotic from urgent care. Therefore, I would not recommend antidiarrhea prescription such as Lomotil at this time. Using antidiarrhea medication such as Lomotil until etiology clarified could be dangerous. Recommend evaluation tomorrow if she is having this frequency of stools. Continue plenty of fluids to avoid dehydration. If she develops any fever, recurrent vomiting, or severe abdominal Pena go to emergency department. RN reviewed above diet instructions with patient. Patient informed that Dr.Burchette recommeded appt. for 02/21/12. Triage per Diarrhea Protocol. No emergent sx identified. Positive triage assessment obtained for " Change in character if bowel movements." Care advice and diet advice given per guidelines. Patient advised bland/starchy foods (white potatoes, white bread, applesauce, rice, bananas, baked, skinless chicken breast). Patient declined appt. when offered 02/20/12. Patient agrees to appt. Appt. scheduled for 02/21/12 1345 with Dr. Caryl Never. Patient advised not to drive self. Patient verbalizes  understanding and agreeable.

## 2012-02-21 NOTE — Telephone Encounter (Signed)
Pt here for OV.  

## 2012-02-21 NOTE — Progress Notes (Signed)
  Subjective:    Patient ID: [name], female    DOB: 07-05-1922, 78 y.o.   MRN: [id number]  HPI  Patient has a long history of frequent loose stools. She's had full GI workup in the past. No clear etiology. At baseline, she takes about 3 Imodium daily. Yesterday she called stating she had about 10 stools. Today she qualifies and states was more like 6 stools. These were more loose and not watery stools. No bloody stools. No fever. No nausea or vomiting. Increased gas. No localizing abdominal pain.  Patient was treated with antibiotic for cellulitis lower extremity by urgent care back in September. She's only had one stool today. Is not unusual for her to have 2 or 3 stools per day at baseline. She started bland diet today. Still has good appetite.   Review of Systems  Constitutional: Negative for fever and chills.  HENT: Negative for trouble swallowing.   Gastrointestinal: Positive for diarrhea. Negative for nausea, vomiting, abdominal pain, constipation, blood in stool and abdominal distention.  Neurological: Negative for dizziness.       Objective:   Physical Exam  Constitutional: She appears well-developed and well-nourished.  Cardiovascular: Normal rate and regular rhythm.   Pulmonary/Chest: Effort normal and breath sounds normal. No respiratory distress. She has no wheezes. She has no rales.  Abdominal: Soft. She exhibits no distension. There is no tenderness. There is no rebound and no guarding.       Somewhat hyperactive bowel sounds but nontender. No masses.          Assessment & Plan:  Frequent loose stools. She has a chronic history of this. Seems to be back to baseline today. Discussed bland diet. Continue as needed Imodium. She has not had any persistent diarrhea to suggest C. difficile colitis. No pain or bloody stools to suggest ischemic colitis or other worrisome etiology

## 2012-02-21 NOTE — Patient Instructions (Addendum)
Diarrhea Infections caused by germs (bacterial) or a virus commonly cause diarrhea. Your caregiver has determined that with time, rest and fluids, the diarrhea should improve. In general, eat normally while drinking more water than usual. Although water may prevent dehydration, it does not contain salt and minerals (electrolytes). Broths, weak tea without caffeine and oral rehydration solutions (ORS) replace fluids and electrolytes. Small amounts of fluids should be taken frequently. Large amounts at one time may not be tolerated. Plain water may be harmful in infants and the elderly. Oral rehydrating solutions (ORS) are available at pharmacies and grocery stores. ORS replace water and important electrolytes in proper proportions. Sports drinks are not as effective as ORS and may be harmful due to sugars worsening diarrhea.  ORS is especially recommended for use in children with diarrhea. As a general guideline for children, replace any new fluid losses from diarrhea and/or vomiting with ORS as follows:  If your child weighs 22 pounds or under (10 kg or less), give 60-120 mL ( -  cup or 2 - 4 ounces) of ORS for each episode of diarrheal stool or vomiting episode.  If your child weighs more than 22 pounds (more than 10 kgs), give 120-240 mL ( - 1 cup or 4 - 8 ounces) of ORS for each diarrheal stool or episode of vomiting.  While correcting for dehydration, children should eat normally. However, foods high in sugar should be avoided because this may worsen diarrhea. Large amounts of carbonated soft drinks, juice, gelatin desserts and other highly sugared drinks should be avoided.  After correction of dehydration, other liquids that are appealing to the child may be added. Children should drink small amounts of fluids frequently and fluids should be increased as tolerated. Children should drink enough fluids to keep urine clear or pale yellow.  Adults should eat normally while drinking more fluids than  usual. Drink small amounts of fluids frequently and increase as tolerated. Drink enough fluids to keep urine clear or pale yellow. Broths, weak decaffeinated tea, lemon lime soft drinks (allowed to go flat) and ORS replace fluids and electrolytes.  Avoid:  Carbonated drinks.  Juice.  Extremely hot or cold fluids.  Caffeine drinks.  Fatty, greasy foods.  Alcohol.  Tobacco.  Too much intake of anything at one time.  Gelatin desserts.  Probiotics are active cultures of beneficial bacteria. They may lessen the amount and number of diarrheal stools in adults. Probiotics can be found in yogurt with active cultures and in supplements.  Wash hands well to avoid spreading bacteria and virus.  Anti-diarrheal medications are not recommended for infants and children.  Only take over-the-counter or prescription medicines for pain, discomfort or fever as directed by your caregiver. Do not give aspirin to children because it may cause Reye's Syndrome.  For adults, ask your caregiver if you should continue all prescribed and over-the-counter medicines.  If your caregiver has given you a follow-up appointment, it is very important to keep that appointment. Not keeping the appointment could result in a chronic or permanent injury, and disability. If there is any problem keeping the appointment, you must call back to this facility for assistance. SEEK IMMEDIATE MEDICAL CARE IF:   You or your child is unable to keep fluids down or other symptoms or problems become worse in spite of treatment.  Vomiting or diarrhea develops and becomes persistent.  There is vomiting of blood or bile (green material).  There is blood in the stool or the stools are black and   tarry.  There is no urine output in 6-8 hours or there is only a small amount of very dark urine.  Abdominal pain develops, increases or localizes.  You have a fever.  Your baby is older than 3 months with a rectal temperature of 102 F  (38.9 C) or higher.  Your baby is 3 months old or younger with a rectal temperature of 100.4 F (38 C) or higher.  You or your child develops excessive weakness, dizziness, fainting or extreme thirst.  You or your child develops a rash, stiff neck, severe headache or become irritable or sleepy and difficult to awaken. MAKE SURE YOU:   Understand these instructions.  Will watch your condition.  Will get help right away if you are not doing well or get worse. Document Released: 03/29/2002 Document Revised: 07/01/2011 Document Reviewed: 02/13/2009 Exit[employer] Patient Information 2013 [employer].  

## 2012-02-24 ENCOUNTER — Telehealth: Payer: Self-pay | Admitting: Family Medicine

## 2012-02-24 NOTE — Telephone Encounter (Signed)
Caller: [name]/Patient; Patient Name: [name]; PCP: Evelena Peat [facility]); Best Callback Phone Number: [phone] Is calling to verify time of appointment 11-5 and verified is at 1145.

## 2012-02-24 NOTE — Telephone Encounter (Signed)
Patient called stating that she is still having diarrhea with blood and mucus and would like to know if she can have something called in or if she can be seen by only Dr. Caryl Never today. Please advise as she refused triage as well.

## 2012-02-24 NOTE — Telephone Encounter (Signed)
Blood in stools is a NEW symptom.  If no heavy rectal bleeding, dizziness, abdominal pain could probably be seen tomorrow.  Go ahead and schedule.  Would not call anything in until we get a better handle on what is going on.

## 2012-02-24 NOTE — Telephone Encounter (Signed)
Spoke with patient.  She has appt tomorrow.  Frequent stools.

## 2012-02-24 NOTE — Telephone Encounter (Signed)
Pt reports the small amount of blood is in the mucous and "her friend who is a nurse told her it probably is not big deal".  Denies heavy rectal bleeding, no dizzness and is still C/O a bloating feeling which can be painful, same as at OV.  Pt refusing to come for another OV, "I want to talk to the doctor personally".  I explained IF he agreed to call her, it would be after he is done seeing patients. Home phone [phone]

## 2012-02-25 ENCOUNTER — Ambulatory Visit (INDEPENDENT_AMBULATORY_CARE_PROVIDER_SITE_OTHER): Payer: Medicare Other | Admitting: Family Medicine

## 2012-02-25 ENCOUNTER — Encounter: Payer: Self-pay | Admitting: Family Medicine

## 2012-02-25 VITALS — BP 122/68 | Temp 97.6°F | Wt 97.0 lb

## 2012-02-25 DIAGNOSIS — R197 Diarrhea, unspecified: Secondary | ICD-10-CM

## 2012-02-25 LAB — CBC WITH DIFFERENTIAL/PLATELET
Basophils Absolute: 0 10*3/uL (ref 0.0–0.1)
Eosinophils Absolute: 0 10*3/uL (ref 0.0–0.7)
HCT: 38.8 % (ref 36.0–46.0)
Hemoglobin: 12.6 g/dL (ref 12.0–15.0)
Lymphocytes Relative: 23.7 % (ref 12.0–46.0)
Lymphs Abs: 1.8 10*3/uL (ref 0.7–4.0)
MCHC: 32.6 g/dL (ref 30.0–36.0)
MCV: 93.1 fl (ref 78.0–100.0)
Monocytes Absolute: 1 10*3/uL (ref 0.1–1.0)
Monocytes Relative: 13.1 % — ABNORMAL HIGH (ref 3.0–12.0)
Neutro Abs: 4.7 10*3/uL (ref 1.4–7.7)
Platelets: 235 10*3/uL (ref 150.0–400.0)
RDW: 12.8 % (ref 11.5–14.6)

## 2012-02-25 LAB — BASIC METABOLIC PANEL
BUN: 9 mg/dL (ref 6–23)
CO2: 27 mEq/L (ref 19–32)
Chloride: 93 mEq/L — ABNORMAL LOW (ref 96–112)
Glucose, Bld: 67 mg/dL — ABNORMAL LOW (ref 70–99)
Potassium: 3.3 mEq/L — ABNORMAL LOW (ref 3.5–5.1)
Sodium: 130 mEq/L — ABNORMAL LOW (ref 135–145)

## 2012-02-25 MED ORDER — METRONIDAZOLE 500 MG PO TABS: 500.0000 mg | ORAL_TABLET | Freq: Three times a day (TID) | ORAL | Status: DC

## 2012-02-25 MED ORDER — CLOTRIMAZOLE-BETAMETHASONE 1-0.05 % EX CREA: TOPICAL_CREAM | Freq: Two times a day (BID) | CUTANEOUS | Status: DC

## 2012-02-25 NOTE — Patient Instructions (Addendum)
Follow up promptly  For any fever, worsening abdominal pain, or any increased bloody stools.

## 2012-02-25 NOTE — Progress Notes (Signed)
  Subjective:    Patient ID: [name], female    DOB: April 19, 1923, 78 y.o.   MRN: [id number]  HPI  Continued intermittent loose stools. Refer to prior note. She has history of tendency toward frequent loose stools in past and had previous colonoscopy which was unremarkable. Her recent history is that she had cellulitis of the leg and went to urgent care. Initially placed on clindamycin which she only took for couple days and this was eventually switched to doxycycline. This was back early October. Couple days ago she noticed some minimal blood on the surface of stool. Her appetite has improved. No fever. No chills. No abnormal pain. Occasional mild cramping. Number of stools per day varies considerably. Using Imodium with minimal relief.  Past Medical History  Diagnosis Date  . Arthritis   . Cancer     squamous cell  . Incontinence of urine    Past Surgical History  Procedure Date  . Breast surgery 1981    breast  . Tonsillectomy   . Abdominal hysterectomy     reports that she quit smoking about 34 years ago. Her smoking use included Cigarettes. She has a 15 pack-year smoking history. She does not have any smokeless tobacco history on file. Her alcohol and drug histories not on file. family history includes Arthritis in her mother and Cancer in her sister. Allergies  Allergen Reactions  . Penicillin G Itching  . Septra (Bactrim)     Stomach concerns.       Review of Systems  Constitutional: Negative for fever and chills.  Respiratory: Negative for cough and shortness of breath.   Cardiovascular: Negative for chest pain.  Gastrointestinal: Positive for diarrhea and blood in stool. Negative for nausea, vomiting, abdominal pain and constipation.  Neurological: Negative for dizziness.       Objective:   Physical Exam  Constitutional: She appears well-developed and well-nourished.  HENT:  Mouth/Throat: Oropharynx is clear and moist.  Cardiovascular: Normal rate and  regular rhythm.   Pulmonary/Chest: Effort normal and breath sounds normal. No respiratory distress. She has no wheezes. She has no rales.  Abdominal: Soft. Bowel sounds are normal. She exhibits no distension and no mass. There is no tenderness. There is no rebound and no guarding.          Assessment & Plan:  Diarrhea. Difficult to sort out whether what she has is exacerbation of her chronic intermittent diarrhea versus acute infectious versus other. No pain to suggest likely ischemic colitis. Recent antibiotics with clindamycin and doxycycline. Check C. difficile PCR. Check basic metabolic panel and CBC. Start metronidazole empirically 500 mg 3 times a day pending stool culture results.  She is not toxic in appearance and appears hydrated and benign abdominal exam.

## 2012-02-26 ENCOUNTER — Telehealth: Payer: Self-pay | Admitting: Family Medicine

## 2012-02-26 NOTE — Progress Notes (Signed)
Quick Note:  Pt has been drinking gatorade since this diarrhea started, her nurse friend bought her some. Pt feeling a little better today and plans to bring by the stool sample, chief complaint today was "I feel so weak, I get tired so quickly". Reassurance given.  FYI ______

## 2012-02-26 NOTE — Telephone Encounter (Signed)
Caller: Dwana/Patient; Patient Name: [name]; PCP: Evelena Peat [facility]); Best Callback Phone Number: [phone]. Caller states she was not able to get stool sample in the jar; she was not able to get the diarrhea into the jar without getting liquid stool in with it.  Advised this is the correct procedure and to bring specimen in to the office.  PCP Calls, No Triage protocol used.

## 2012-02-27 NOTE — Progress Notes (Signed)
Quick Note:  Pt informed, now she is C/O headaches and "I never get headaches, could it be the new medicine". Pt woke with a headache this AM and did take a tylenol and it did help, headache is back and I encouraged her to take another tylenol. Please advise ______

## 2012-02-28 ENCOUNTER — Telehealth: Payer: Self-pay | Admitting: Family Medicine

## 2012-02-28 NOTE — Telephone Encounter (Signed)
I spoke very slowly on pt VM and repeated the instructions 3 times

## 2012-02-28 NOTE — Telephone Encounter (Signed)
OK to take vitamins and may stop metronidazole at this time.

## 2012-02-28 NOTE — Telephone Encounter (Signed)
Caller: [name]/Patient; Patient Name: [name]; PCP: Evelena Peat [facility]); Best Callback Phone Number: [phone]. Patient feels like she may have missed a call back from the doctor. Reviewed EPIC and explained to the patient that her test for C. Diff was negative. Patient wants to know if she can start taking her vitamins again and also wants to know if she needs to take Metronidazole 500mg  as she was taking before. Reports headache that she had yesterday is gone now. Reports she is no longer having diarrhea. PLEASE CALL PATIENT AND LET HER KNOW IF SHE SHOULD CONTINUE TAKING METRONIDAZOLE AND IF SHE CAN START TAKING HER VITAMINS AGAIN. She gives permission to leave a detailed voicemail message if she is not at home. She does request that if you leave a message to speak slowly so she can understand everything. Thanks.

## 2012-03-04 ENCOUNTER — Telehealth: Payer: Self-pay | Admitting: Family Medicine

## 2012-03-04 NOTE — Telephone Encounter (Signed)
At this point, I recommend follow up with her gastroenterologist.  She has seen Dr Elnoria Howard in past.

## 2012-03-04 NOTE — Telephone Encounter (Signed)
Caller: Margorie/Patient; Patient Name: [name]; PCP: Evelena Peat [facility]); Best Callback Phone Number: [phone]. Call regarding Diarrhea. Onset 03/02/12. Pt noticed a scant amout of pink when she wiped.  Afebrile. Pt evaluated and treated for diarrhea. C-Diff negative. Pt states she started back on a regular diet and the diarhea has returned. Pt taking Immodium w/out relief.  Denies abdominal pain.  Emergent symptoms r/o per Diarrhea protocol w/exception to ' Diarrhea lasting loger than 24 hrs and not improving wkth home care'. See Provider w/i 24 hrs.  Pt declined appt.  Home care advice given.

## 2012-03-04 NOTE — Telephone Encounter (Signed)
talked with pt- had been on liquid diet for about 6 days and when she was told that c-diff was neg she started eating regular food and diarrhea returned--told pt to go back on liquid diet until she hears from [place].  Pt says she feels something is wrong because this has been gong on for 6 days

## 2012-03-04 NOTE — Telephone Encounter (Signed)
Pt informed and voiced her understanding 

## 2012-03-09 DIAGNOSIS — R197 Diarrhea, unspecified: Secondary | ICD-10-CM | POA: Diagnosis not present

## 2012-03-10 ENCOUNTER — Telehealth: Payer: Self-pay | Admitting: Family Medicine

## 2012-03-10 NOTE — Telephone Encounter (Signed)
noted 

## 2012-03-10 NOTE — Telephone Encounter (Signed)
Patient calling to inform Harriett Sine that she does not have to call or fax prescription in to [facility] in December for Nexium.  Patient spoke with [facility] [facility] and she is already set up for January.

## 2012-03-12 DIAGNOSIS — M25569 Pain in unspecified knee: Secondary | ICD-10-CM | POA: Diagnosis not present

## 2012-03-16 ENCOUNTER — Telehealth: Payer: Self-pay | Admitting: Family Medicine

## 2012-03-16 NOTE — Telephone Encounter (Signed)
Patient calling to obtain detail of her labs on the stool sent for testing.  Informed patient that stool was sent off to see if she had a bacterial infection of C. Difficile and that it came back negative.

## 2012-03-30 ENCOUNTER — Ambulatory Visit: Payer: Medicare Other | Admitting: Family Medicine

## 2012-04-01 ENCOUNTER — Ambulatory Visit: Payer: Medicare Other | Admitting: Family Medicine

## 2012-05-04 DIAGNOSIS — Z961 Presence of intraocular lens: Secondary | ICD-10-CM | POA: Diagnosis not present

## 2012-05-04 DIAGNOSIS — H353190 Nonexudative age-related macular degeneration, unspecified eye, stage unspecified: Secondary | ICD-10-CM | POA: Diagnosis not present

## 2012-05-04 DIAGNOSIS — H5200 Hypermetropia, unspecified eye: Secondary | ICD-10-CM | POA: Diagnosis not present

## 2012-05-04 DIAGNOSIS — H524 Presbyopia: Secondary | ICD-10-CM | POA: Diagnosis not present

## 2012-05-05 DIAGNOSIS — C44721 Squamous cell carcinoma of skin of unspecified lower limb, including hip: Secondary | ICD-10-CM | POA: Diagnosis not present

## 2012-05-19 DIAGNOSIS — S81809A Unspecified open wound, unspecified lower leg, initial encounter: Secondary | ICD-10-CM | POA: Diagnosis not present

## 2012-05-19 DIAGNOSIS — S81009A Unspecified open wound, unspecified knee, initial encounter: Secondary | ICD-10-CM | POA: Diagnosis not present

## 2012-05-26 DIAGNOSIS — L600 Ingrowing nail: Secondary | ICD-10-CM | POA: Diagnosis not present

## 2012-05-26 DIAGNOSIS — R29898 Other symptoms and signs involving the musculoskeletal system: Secondary | ICD-10-CM | POA: Diagnosis not present

## 2012-05-26 DIAGNOSIS — M1710 Unilateral primary osteoarthritis, unspecified knee: Secondary | ICD-10-CM | POA: Diagnosis not present

## 2012-05-26 DIAGNOSIS — S81009A Unspecified open wound, unspecified knee, initial encounter: Secondary | ICD-10-CM | POA: Diagnosis not present

## 2012-05-26 DIAGNOSIS — S91009A Unspecified open wound, unspecified ankle, initial encounter: Secondary | ICD-10-CM | POA: Diagnosis not present

## 2012-05-26 DIAGNOSIS — S81809A Unspecified open wound, unspecified lower leg, initial encounter: Secondary | ICD-10-CM | POA: Diagnosis not present

## 2012-06-03 DIAGNOSIS — L821 Other seborrheic keratosis: Secondary | ICD-10-CM | POA: Diagnosis not present

## 2012-06-03 DIAGNOSIS — D485 Neoplasm of uncertain behavior of skin: Secondary | ICD-10-CM | POA: Diagnosis not present

## 2012-06-03 DIAGNOSIS — Z85828 Personal history of other malignant neoplasm of skin: Secondary | ICD-10-CM | POA: Diagnosis not present

## 2012-06-03 DIAGNOSIS — C44721 Squamous cell carcinoma of skin of unspecified lower limb, including hip: Secondary | ICD-10-CM | POA: Diagnosis not present

## 2012-06-12 ENCOUNTER — Telehealth: Payer: Self-pay | Admitting: Family Medicine

## 2012-06-12 NOTE — Telephone Encounter (Signed)
Patient called in due to related "medication issue".  Attempted to call back x2.  Left voicemail messages x2. Advised to call back to office for assistace.

## 2012-06-12 NOTE — Telephone Encounter (Signed)
Pt received letter from [employer] stating they want to start giving pt the generic esomeprazole (NEXIUM) 40 MG capsule for her refills. Pls respond only if this is not ok.

## 2012-06-23 DIAGNOSIS — C44721 Squamous cell carcinoma of skin of unspecified lower limb, including hip: Secondary | ICD-10-CM | POA: Diagnosis not present

## 2012-07-01 ENCOUNTER — Ambulatory Visit (INDEPENDENT_AMBULATORY_CARE_PROVIDER_SITE_OTHER): Payer: Medicare Other | Admitting: Family Medicine

## 2012-07-01 ENCOUNTER — Encounter: Payer: Self-pay | Admitting: Family Medicine

## 2012-07-01 VITALS — BP 140/70 | Temp 98.2°F | Wt 98.0 lb

## 2012-07-01 DIAGNOSIS — E876 Hypokalemia: Secondary | ICD-10-CM | POA: Diagnosis not present

## 2012-07-01 DIAGNOSIS — E871 Hypo-osmolality and hyponatremia: Secondary | ICD-10-CM | POA: Diagnosis not present

## 2012-07-01 DIAGNOSIS — R197 Diarrhea, unspecified: Secondary | ICD-10-CM

## 2012-07-01 LAB — BASIC METABOLIC PANEL
BUN: 23 mg/dL (ref 6–23)
Calcium: 9.1 mg/dL (ref 8.4–10.5)
Chloride: 104 mEq/L (ref 96–112)
Creatinine, Ser: 0.7 mg/dL (ref 0.4–1.2)

## 2012-07-01 NOTE — Progress Notes (Signed)
  Subjective:    Patient ID: [name], female    DOB: 1922-07-11, 79 y.o.   MRN: [id number]  HPI Patient has history of GERD and chronic frequent stools She's had extensive workup including colonoscopy. No clear etiology. Appetite is good. Weight is stable- actually up 1 pound from last visit. She has about 2 stools per day which are formed She continues to take Imodium intermittently. No bloody stools. No nausea or vomiting. No abdominal pain No history of lactose intolerance.  Past Medical History  Diagnosis Date  . Arthritis   . Cancer     squamous cell  . Incontinence of urine    Past Surgical History  Procedure Laterality Date  . Breast surgery  1981    breast  . Tonsillectomy    . Abdominal hysterectomy      reports that she quit smoking about 35 years ago. Her smoking use included Cigarettes. She has a 15 pack-year smoking history. She does not have any smokeless tobacco history on file. Her alcohol and drug histories are not on file. family history includes Arthritis in her mother and Cancer in her sister. Allergies  Allergen Reactions  . Penicillin G Itching  . Septra (Bactrim)     Stomach concerns.       Review of Systems  Constitutional: Negative for fever, chills, appetite change and unexpected weight change.  Respiratory: Negative for cough.   Cardiovascular: Negative for chest pain.  Gastrointestinal: Positive for diarrhea. Negative for nausea, vomiting, abdominal pain, blood in stool and abdominal distention.  Neurological: Negative for dizziness.       Objective:   Physical Exam  Constitutional: She appears well-developed and well-nourished.  Cardiovascular: Normal rate and regular rhythm.   Pulmonary/Chest: Effort normal and breath sounds normal. No respiratory distress. She has no wheezes. She has no rales.  Abdominal: Soft. There is no tenderness.  Musculoskeletal: She exhibits no edema.          Assessment & Plan:  Frequent loose  stools-chronic and unchanged.. No evidence for malabsorption. Prior history of hypokalemia and hyponatremia. Recheck basic metabolic panel. Overall she seems stable and has had extensive GI workup in the past with no clear etiology.

## 2012-07-02 NOTE — Progress Notes (Signed)
Quick Note:  Pt informed ______ 

## 2012-08-04 DIAGNOSIS — M9981 Other biomechanical lesions of cervical region: Secondary | ICD-10-CM | POA: Diagnosis not present

## 2012-08-04 DIAGNOSIS — M5030 Other cervical disc degeneration, unspecified cervical region: Secondary | ICD-10-CM | POA: Diagnosis not present

## 2012-08-04 DIAGNOSIS — IMO0002 Reserved for concepts with insufficient information to code with codable children: Secondary | ICD-10-CM | POA: Diagnosis not present

## 2012-08-04 DIAGNOSIS — M999 Biomechanical lesion, unspecified: Secondary | ICD-10-CM | POA: Diagnosis not present

## 2012-08-10 DIAGNOSIS — M9981 Other biomechanical lesions of cervical region: Secondary | ICD-10-CM | POA: Diagnosis not present

## 2012-08-10 DIAGNOSIS — M999 Biomechanical lesion, unspecified: Secondary | ICD-10-CM | POA: Diagnosis not present

## 2012-08-10 DIAGNOSIS — M5030 Other cervical disc degeneration, unspecified cervical region: Secondary | ICD-10-CM | POA: Diagnosis not present

## 2012-08-10 DIAGNOSIS — IMO0002 Reserved for concepts with insufficient information to code with codable children: Secondary | ICD-10-CM | POA: Diagnosis not present

## 2012-08-13 DIAGNOSIS — M9981 Other biomechanical lesions of cervical region: Secondary | ICD-10-CM | POA: Diagnosis not present

## 2012-08-13 DIAGNOSIS — M5030 Other cervical disc degeneration, unspecified cervical region: Secondary | ICD-10-CM | POA: Diagnosis not present

## 2012-08-13 DIAGNOSIS — M999 Biomechanical lesion, unspecified: Secondary | ICD-10-CM | POA: Diagnosis not present

## 2012-08-13 DIAGNOSIS — IMO0002 Reserved for concepts with insufficient information to code with codable children: Secondary | ICD-10-CM | POA: Diagnosis not present

## 2012-08-18 DIAGNOSIS — M999 Biomechanical lesion, unspecified: Secondary | ICD-10-CM | POA: Diagnosis not present

## 2012-08-18 DIAGNOSIS — M9981 Other biomechanical lesions of cervical region: Secondary | ICD-10-CM | POA: Diagnosis not present

## 2012-08-18 DIAGNOSIS — IMO0002 Reserved for concepts with insufficient information to code with codable children: Secondary | ICD-10-CM | POA: Diagnosis not present

## 2012-08-18 DIAGNOSIS — M531 Cervicobrachial syndrome: Secondary | ICD-10-CM | POA: Diagnosis not present

## 2012-08-20 DIAGNOSIS — M999 Biomechanical lesion, unspecified: Secondary | ICD-10-CM | POA: Diagnosis not present

## 2012-08-20 DIAGNOSIS — M5030 Other cervical disc degeneration, unspecified cervical region: Secondary | ICD-10-CM | POA: Diagnosis not present

## 2012-08-20 DIAGNOSIS — M9981 Other biomechanical lesions of cervical region: Secondary | ICD-10-CM | POA: Diagnosis not present

## 2012-08-20 DIAGNOSIS — IMO0002 Reserved for concepts with insufficient information to code with codable children: Secondary | ICD-10-CM | POA: Diagnosis not present

## 2012-08-25 DIAGNOSIS — IMO0002 Reserved for concepts with insufficient information to code with codable children: Secondary | ICD-10-CM | POA: Diagnosis not present

## 2012-08-25 DIAGNOSIS — M999 Biomechanical lesion, unspecified: Secondary | ICD-10-CM | POA: Diagnosis not present

## 2012-08-25 DIAGNOSIS — M9981 Other biomechanical lesions of cervical region: Secondary | ICD-10-CM | POA: Diagnosis not present

## 2012-08-25 DIAGNOSIS — M5030 Other cervical disc degeneration, unspecified cervical region: Secondary | ICD-10-CM | POA: Diagnosis not present

## 2012-08-27 DIAGNOSIS — M5030 Other cervical disc degeneration, unspecified cervical region: Secondary | ICD-10-CM | POA: Diagnosis not present

## 2012-08-27 DIAGNOSIS — M999 Biomechanical lesion, unspecified: Secondary | ICD-10-CM | POA: Diagnosis not present

## 2012-08-27 DIAGNOSIS — M9981 Other biomechanical lesions of cervical region: Secondary | ICD-10-CM | POA: Diagnosis not present

## 2012-08-27 DIAGNOSIS — IMO0002 Reserved for concepts with insufficient information to code with codable children: Secondary | ICD-10-CM | POA: Diagnosis not present

## 2012-08-31 DIAGNOSIS — M999 Biomechanical lesion, unspecified: Secondary | ICD-10-CM | POA: Diagnosis not present

## 2012-08-31 DIAGNOSIS — M5030 Other cervical disc degeneration, unspecified cervical region: Secondary | ICD-10-CM | POA: Diagnosis not present

## 2012-08-31 DIAGNOSIS — M9981 Other biomechanical lesions of cervical region: Secondary | ICD-10-CM | POA: Diagnosis not present

## 2012-08-31 DIAGNOSIS — IMO0002 Reserved for concepts with insufficient information to code with codable children: Secondary | ICD-10-CM | POA: Diagnosis not present

## 2012-09-02 DIAGNOSIS — M5030 Other cervical disc degeneration, unspecified cervical region: Secondary | ICD-10-CM | POA: Diagnosis not present

## 2012-09-02 DIAGNOSIS — M999 Biomechanical lesion, unspecified: Secondary | ICD-10-CM | POA: Diagnosis not present

## 2012-09-02 DIAGNOSIS — IMO0002 Reserved for concepts with insufficient information to code with codable children: Secondary | ICD-10-CM | POA: Diagnosis not present

## 2012-09-02 DIAGNOSIS — M9981 Other biomechanical lesions of cervical region: Secondary | ICD-10-CM | POA: Diagnosis not present

## 2012-09-10 DIAGNOSIS — M5030 Other cervical disc degeneration, unspecified cervical region: Secondary | ICD-10-CM | POA: Diagnosis not present

## 2012-09-10 DIAGNOSIS — M9981 Other biomechanical lesions of cervical region: Secondary | ICD-10-CM | POA: Diagnosis not present

## 2012-09-10 DIAGNOSIS — M999 Biomechanical lesion, unspecified: Secondary | ICD-10-CM | POA: Diagnosis not present

## 2012-09-10 DIAGNOSIS — IMO0002 Reserved for concepts with insufficient information to code with codable children: Secondary | ICD-10-CM | POA: Diagnosis not present

## 2012-09-17 DIAGNOSIS — M9981 Other biomechanical lesions of cervical region: Secondary | ICD-10-CM | POA: Diagnosis not present

## 2012-09-17 DIAGNOSIS — M531 Cervicobrachial syndrome: Secondary | ICD-10-CM | POA: Diagnosis not present

## 2012-09-17 DIAGNOSIS — M999 Biomechanical lesion, unspecified: Secondary | ICD-10-CM | POA: Diagnosis not present

## 2012-09-17 DIAGNOSIS — IMO0002 Reserved for concepts with insufficient information to code with codable children: Secondary | ICD-10-CM | POA: Diagnosis not present

## 2012-09-24 DIAGNOSIS — IMO0002 Reserved for concepts with insufficient information to code with codable children: Secondary | ICD-10-CM | POA: Diagnosis not present

## 2012-09-24 DIAGNOSIS — M531 Cervicobrachial syndrome: Secondary | ICD-10-CM | POA: Diagnosis not present

## 2012-09-24 DIAGNOSIS — M999 Biomechanical lesion, unspecified: Secondary | ICD-10-CM | POA: Diagnosis not present

## 2012-09-24 DIAGNOSIS — M9981 Other biomechanical lesions of cervical region: Secondary | ICD-10-CM | POA: Diagnosis not present

## 2012-10-01 DIAGNOSIS — M531 Cervicobrachial syndrome: Secondary | ICD-10-CM | POA: Diagnosis not present

## 2012-10-01 DIAGNOSIS — M999 Biomechanical lesion, unspecified: Secondary | ICD-10-CM | POA: Diagnosis not present

## 2012-10-01 DIAGNOSIS — IMO0002 Reserved for concepts with insufficient information to code with codable children: Secondary | ICD-10-CM | POA: Diagnosis not present

## 2012-10-01 DIAGNOSIS — M9981 Other biomechanical lesions of cervical region: Secondary | ICD-10-CM | POA: Diagnosis not present

## 2012-10-08 DIAGNOSIS — M531 Cervicobrachial syndrome: Secondary | ICD-10-CM | POA: Diagnosis not present

## 2012-10-08 DIAGNOSIS — M9981 Other biomechanical lesions of cervical region: Secondary | ICD-10-CM | POA: Diagnosis not present

## 2012-10-08 DIAGNOSIS — IMO0002 Reserved for concepts with insufficient information to code with codable children: Secondary | ICD-10-CM | POA: Diagnosis not present

## 2012-10-08 DIAGNOSIS — M999 Biomechanical lesion, unspecified: Secondary | ICD-10-CM | POA: Diagnosis not present

## 2012-10-13 DIAGNOSIS — M9981 Other biomechanical lesions of cervical region: Secondary | ICD-10-CM | POA: Diagnosis not present

## 2012-10-13 DIAGNOSIS — IMO0002 Reserved for concepts with insufficient information to code with codable children: Secondary | ICD-10-CM | POA: Diagnosis not present

## 2012-10-13 DIAGNOSIS — M531 Cervicobrachial syndrome: Secondary | ICD-10-CM | POA: Diagnosis not present

## 2012-10-13 DIAGNOSIS — M999 Biomechanical lesion, unspecified: Secondary | ICD-10-CM | POA: Diagnosis not present

## 2012-10-19 ENCOUNTER — Telehealth: Payer: Self-pay | Admitting: Family Medicine

## 2012-10-19 DIAGNOSIS — H9190 Unspecified hearing loss, unspecified ear: Secondary | ICD-10-CM

## 2012-10-19 NOTE — Telephone Encounter (Signed)
Caller: [name]/Patient; Phone: [phone]; Reason for Call: Patient is requesting to ask (Nancy/Dr.  Burchette) to approve a hearing test for her and to be sure that time has lapsed enough that her insurance, [employer]) will pay for it.  It has been 1.  5 years since her last one.  She also requests to send the approval to her audiologist, [facility] at [phone] (fax).  She also requests a call regarding the outcome.

## 2012-10-19 NOTE — Telephone Encounter (Signed)
Referral request made

## 2012-10-19 NOTE — Telephone Encounter (Signed)
Ok to refer for hearing test.  I cannot answer whether insurance will cover and SHE will have to check with them regarding that.  Audiologist will be one to give her results.

## 2012-10-26 ENCOUNTER — Telehealth: Payer: Self-pay | Admitting: Family Medicine

## 2012-10-26 NOTE — Telephone Encounter (Signed)
OK to refill doxycycline 100 mg po bid for 10 days but she needs prompt follow up if not improving next few days.

## 2012-10-26 NOTE — Telephone Encounter (Signed)
Pt has swelling in right foot. Pt had the identical cellulitis in right foot, treated w/ doxycycline. In 10/25 .Pt is positive that is what she has now.  Pls advise.  Pt states she had been holding for triage nurse for over 30 min.

## 2012-10-27 MED ORDER — DOXYCYCLINE HYCLATE 100 MG PO TABS: 100.0000 mg | ORAL_TABLET | Freq: Two times a day (BID) | ORAL | Status: DC

## 2012-10-27 NOTE — Telephone Encounter (Signed)
Pt aware.

## 2012-10-29 DIAGNOSIS — M999 Biomechanical lesion, unspecified: Secondary | ICD-10-CM | POA: Diagnosis not present

## 2012-10-29 DIAGNOSIS — M531 Cervicobrachial syndrome: Secondary | ICD-10-CM | POA: Diagnosis not present

## 2012-10-29 DIAGNOSIS — IMO0002 Reserved for concepts with insufficient information to code with codable children: Secondary | ICD-10-CM | POA: Diagnosis not present

## 2012-10-29 DIAGNOSIS — M9981 Other biomechanical lesions of cervical region: Secondary | ICD-10-CM | POA: Diagnosis not present

## 2012-11-11 DIAGNOSIS — M1710 Unilateral primary osteoarthritis, unspecified knee: Secondary | ICD-10-CM | POA: Diagnosis not present

## 2012-11-11 DIAGNOSIS — M205X9 Other deformities of toe(s) (acquired), unspecified foot: Secondary | ICD-10-CM | POA: Diagnosis not present

## 2012-11-12 DIAGNOSIS — M531 Cervicobrachial syndrome: Secondary | ICD-10-CM | POA: Diagnosis not present

## 2012-11-12 DIAGNOSIS — M999 Biomechanical lesion, unspecified: Secondary | ICD-10-CM | POA: Diagnosis not present

## 2012-11-12 DIAGNOSIS — IMO0002 Reserved for concepts with insufficient information to code with codable children: Secondary | ICD-10-CM | POA: Diagnosis not present

## 2012-11-12 DIAGNOSIS — M9981 Other biomechanical lesions of cervical region: Secondary | ICD-10-CM | POA: Diagnosis not present

## 2012-11-26 DIAGNOSIS — M25569 Pain in unspecified knee: Secondary | ICD-10-CM | POA: Diagnosis not present

## 2012-11-26 DIAGNOSIS — M1710 Unilateral primary osteoarthritis, unspecified knee: Secondary | ICD-10-CM | POA: Diagnosis not present

## 2012-11-28 ENCOUNTER — Telehealth: Payer: Self-pay

## 2012-11-28 NOTE — Telephone Encounter (Signed)
PATIENT HAS BEEN TOLD BY [place] TO WEAR THE DIABETIC SOCKS.  SHE LIKES THE COTTON, KNEE-HIGH KIND.  SAYS SHE IS HAVING A HARD TIME GETTING THESE FROM THE PLACE WE RECOMMENDED AND THAT THEY ARE NOT WILLING TO HELP HER VERY MUCH.  IS THERE ANOTHER PLACE WE CAN RECOMMEND FOR HER TO GET THESE?  CALL [phone]

## 2012-11-30 NOTE — Telephone Encounter (Signed)
Called again.......

## 2012-11-30 NOTE — Telephone Encounter (Signed)
Called patient about the socks, where is she going? Line busy

## 2012-11-30 NOTE — Telephone Encounter (Signed)
Pt called back and stated that she was having trouble getting her DM socks from [employer]. Pt wasn't sure what the problem was and asked if I could call to see if I can find out if she can get them. I called [facility] and was told that they carry the cottton DM socks all the time and they are not sure why pt had trouble. Their socks are only mid calf length, but carry the knee high in Men's and could be fitted in those if pt preferred. Called pt and gave her info. She will CB if she has any trouble getting what she wants in the Men's socks.

## 2012-12-03 DIAGNOSIS — M1710 Unilateral primary osteoarthritis, unspecified knee: Secondary | ICD-10-CM | POA: Diagnosis not present

## 2012-12-10 DIAGNOSIS — M1710 Unilateral primary osteoarthritis, unspecified knee: Secondary | ICD-10-CM | POA: Diagnosis not present

## 2012-12-17 DIAGNOSIS — M1710 Unilateral primary osteoarthritis, unspecified knee: Secondary | ICD-10-CM | POA: Diagnosis not present

## 2012-12-24 DIAGNOSIS — M1710 Unilateral primary osteoarthritis, unspecified knee: Secondary | ICD-10-CM | POA: Diagnosis not present

## 2012-12-24 DIAGNOSIS — M25569 Pain in unspecified knee: Secondary | ICD-10-CM | POA: Diagnosis not present

## 2012-12-30 ENCOUNTER — Encounter: Payer: Self-pay | Admitting: Family Medicine

## 2012-12-30 ENCOUNTER — Ambulatory Visit (INDEPENDENT_AMBULATORY_CARE_PROVIDER_SITE_OTHER): Payer: Medicare Other | Admitting: Family Medicine

## 2012-12-30 VITALS — BP 124/60 | HR 86 | Temp 98.2°F | Wt 102.0 lb

## 2012-12-30 DIAGNOSIS — Z23 Encounter for immunization: Secondary | ICD-10-CM

## 2012-12-30 DIAGNOSIS — K219 Gastro-esophageal reflux disease without esophagitis: Secondary | ICD-10-CM

## 2012-12-30 NOTE — Progress Notes (Signed)
  Subjective:    Patient ID: [name], female    DOB: 1923/04/09, 79 y.o.   MRN: [id number]  HPI Patient seen for routine medical followup. She's history of chronic frequent loose stools and has seen gastroenterologist extensive workup. This is currently controlled with Imodium 2-3 tablets daily. She has history of GERD and takes Nexium. She's tried several times discontinuing without success. She denies any recent dysphagia. Appetite is fair. She's had some mild weight gain since last visit. Overall doing very well. No recent falls.  Denies any depressive symptoms. She has some osteoarthritis symptoms involving the neck. She avoids regular use of arthritis medication. Patient needs flu vaccine. Pneumovax up-to-date  Past Medical History  Diagnosis Date  . Arthritis   . Cancer     squamous cell  . Incontinence of urine    Past Surgical History  Procedure Laterality Date  . Breast surgery  1981    breast  . Tonsillectomy    . Abdominal hysterectomy      reports that she quit smoking about 35 years ago. Her smoking use included Cigarettes. She has a 15 pack-year smoking history. She does not have any smokeless tobacco history on file. Her alcohol and drug histories are not on file. family history includes Arthritis in her mother; Cancer in her sister. Allergies  Allergen Reactions  . Penicillin G Itching  . Septra [Bactrim]     Stomach concerns.        Review of Systems  Constitutional: Negative for fatigue.  Eyes: Negative for visual disturbance.  Respiratory: Negative for cough, chest tightness, shortness of breath and wheezing.   Cardiovascular: Negative for chest pain, palpitations and leg swelling.  Neurological: Negative for dizziness, seizures, syncope, weakness, light-headedness and headaches.       Objective:   Physical Exam  Constitutional: She appears well-developed and well-nourished.  HENT:  Mouth/Throat: Oropharynx is clear and moist.   Cardiovascular: Normal rate and regular rhythm.   Pulmonary/Chest: Effort normal and breath sounds normal. No respiratory distress. She has no wheezes. She has no rales.  Musculoskeletal: She exhibits no edema.          Assessment & Plan  GERD. Symptomatically stable. Continue Nexium. Consider repeat B12 by next year with chronic PPI use. Flu vaccine given.  She has had prior pneumovax.

## 2012-12-31 DIAGNOSIS — L570 Actinic keratosis: Secondary | ICD-10-CM | POA: Diagnosis not present

## 2012-12-31 DIAGNOSIS — L259 Unspecified contact dermatitis, unspecified cause: Secondary | ICD-10-CM | POA: Diagnosis not present

## 2012-12-31 DIAGNOSIS — Z85828 Personal history of other malignant neoplasm of skin: Secondary | ICD-10-CM | POA: Diagnosis not present

## 2013-01-05 ENCOUNTER — Ambulatory Visit (INDEPENDENT_AMBULATORY_CARE_PROVIDER_SITE_OTHER): Payer: Medicare Other | Admitting: Family Medicine

## 2013-01-05 VITALS — BP 148/70 | HR 80 | Temp 98.0°F | Resp 20 | Ht 62.0 in | Wt 102.2 lb

## 2013-01-05 DIAGNOSIS — G459 Transient cerebral ischemic attack, unspecified: Secondary | ICD-10-CM

## 2013-01-05 HISTORY — DX: Transient cerebral ischemic attack, unspecified: G45.9

## 2013-01-05 NOTE — Patient Instructions (Addendum)

## 2013-01-05 NOTE — Progress Notes (Signed)
79 yo very active woman who went to the gym this afternoon.  When driving home she experienced blurry vision for perhaps 15 minutes associated with difficulty expressing herself or remembering phone numbers.  She has had complete resolution.  No headache or trauma.  No double vision.  No focal weakness.    Objective: Alert, appropriate, vivacious adult woman in no distress. HEENT: Unremarkable Neck: No bruits, supple, no adenopathy or thyromegaly Chest: Clear Heart: Regular, no murmur or gallop Extremities no edema Skin: Ecchymosis right dorsal thenar area, otherwise clear Neurological exam: alert, oriented, appropriate, normal thoughts Cranial nerves III through XII: Intact with pupils equal reactive to light and accommodation TMs: Normal, patient using hearing aids Oropharynx: Clear Motor exam: Full strength and range of motion of all 4 extremities. Romberg: Negative Sensory exam: Normal to touch  Assessment: Transient ischemic attack, resolved.  Plan: Continue the aspirin 81 mg daily CT scan of the head tomorrow with carotid duplex ultrasound   signed, [name].D.

## 2013-01-07 ENCOUNTER — Ambulatory Visit
Admission: RE | Admit: 2013-01-07 | Discharge: 2013-01-07 | Disposition: A | Discharge status: Home / Self Care | Payer: Medicare Other | Source: Ambulatory Visit | Attending: Family Medicine | Admitting: Family Medicine

## 2013-01-07 DIAGNOSIS — R51 Headache: Secondary | ICD-10-CM | POA: Diagnosis not present

## 2013-01-07 DIAGNOSIS — G459 Transient cerebral ischemic attack, unspecified: Secondary | ICD-10-CM

## 2013-01-07 DIAGNOSIS — I658 Occlusion and stenosis of other precerebral arteries: Secondary | ICD-10-CM | POA: Diagnosis not present

## 2013-01-12 ENCOUNTER — Ambulatory Visit (INDEPENDENT_AMBULATORY_CARE_PROVIDER_SITE_OTHER): Payer: Medicare Other | Admitting: Family Medicine

## 2013-01-12 ENCOUNTER — Inpatient Hospital Stay (HOSPITAL_COMMUNITY)
Admission: EM | Admit: 2013-01-12 | Discharge: 2013-01-18 | DRG: 563 | Disposition: A | Discharge status: Skilled Nursing Facility | Payer: Medicare Other | Attending: Internal Medicine | Admitting: Internal Medicine

## 2013-01-12 ENCOUNTER — Ambulatory Visit: Payer: Self-pay

## 2013-01-12 ENCOUNTER — Ambulatory Visit: Payer: Medicare Other

## 2013-01-12 ENCOUNTER — Encounter (HOSPITAL_COMMUNITY): Payer: Self-pay | Admitting: Nurse Practitioner

## 2013-01-12 VITALS — BP 132/64 | HR 84 | Temp 98.4°F | Resp 18

## 2013-01-12 DIAGNOSIS — R55 Syncope and collapse: Secondary | ICD-10-CM

## 2013-01-12 DIAGNOSIS — Z7982 Long term (current) use of aspirin: Secondary | ICD-10-CM

## 2013-01-12 DIAGNOSIS — S82109A Unspecified fracture of upper end of unspecified tibia, initial encounter for closed fracture: Principal | ICD-10-CM | POA: Diagnosis present

## 2013-01-12 DIAGNOSIS — S82142A Displaced bicondylar fracture of left tibia, initial encounter for closed fracture: Secondary | ICD-10-CM

## 2013-01-12 DIAGNOSIS — R42 Dizziness and giddiness: Secondary | ICD-10-CM | POA: Diagnosis not present

## 2013-01-12 DIAGNOSIS — M79609 Pain in unspecified limb: Secondary | ICD-10-CM

## 2013-01-12 DIAGNOSIS — N393 Stress incontinence (female) (male): Secondary | ICD-10-CM

## 2013-01-12 DIAGNOSIS — Z9181 History of falling: Secondary | ICD-10-CM

## 2013-01-12 DIAGNOSIS — R404 Transient alteration of awareness: Secondary | ICD-10-CM | POA: Diagnosis not present

## 2013-01-12 DIAGNOSIS — Y92009 Unspecified place in unspecified non-institutional (private) residence as the place of occurrence of the external cause: Secondary | ICD-10-CM

## 2013-01-12 DIAGNOSIS — G459 Transient cerebral ischemic attack, unspecified: Secondary | ICD-10-CM

## 2013-01-12 DIAGNOSIS — S8992XA Unspecified injury of left lower leg, initial encounter: Secondary | ICD-10-CM

## 2013-01-12 DIAGNOSIS — I369 Nonrheumatic tricuspid valve disorder, unspecified: Secondary | ICD-10-CM | POA: Diagnosis not present

## 2013-01-12 DIAGNOSIS — R262 Difficulty in walking, not elsewhere classified: Secondary | ICD-10-CM | POA: Diagnosis not present

## 2013-01-12 DIAGNOSIS — R197 Diarrhea, unspecified: Secondary | ICD-10-CM | POA: Diagnosis not present

## 2013-01-12 DIAGNOSIS — R110 Nausea: Secondary | ICD-10-CM | POA: Diagnosis not present

## 2013-01-12 DIAGNOSIS — S82143A Displaced bicondylar fracture of unspecified tibia, initial encounter for closed fracture: Secondary | ICD-10-CM

## 2013-01-12 DIAGNOSIS — S81009A Unspecified open wound, unspecified knee, initial encounter: Secondary | ICD-10-CM | POA: Diagnosis present

## 2013-01-12 DIAGNOSIS — W19XXXA Unspecified fall, initial encounter: Secondary | ICD-10-CM | POA: Diagnosis not present

## 2013-01-12 DIAGNOSIS — Z4789 Encounter for other orthopedic aftercare: Secondary | ICD-10-CM | POA: Diagnosis not present

## 2013-01-12 DIAGNOSIS — Z79899 Other long term (current) drug therapy: Secondary | ICD-10-CM | POA: Diagnosis not present

## 2013-01-12 DIAGNOSIS — S51809A Unspecified open wound of unspecified forearm, initial encounter: Secondary | ICD-10-CM | POA: Diagnosis present

## 2013-01-12 DIAGNOSIS — K219 Gastro-esophageal reflux disease without esophagitis: Secondary | ICD-10-CM | POA: Diagnosis present

## 2013-01-12 DIAGNOSIS — Z87891 Personal history of nicotine dependence: Secondary | ICD-10-CM | POA: Diagnosis not present

## 2013-01-12 DIAGNOSIS — C4492 Squamous cell carcinoma of skin, unspecified: Secondary | ICD-10-CM

## 2013-01-12 DIAGNOSIS — R1312 Dysphagia, oropharyngeal phase: Secondary | ICD-10-CM | POA: Diagnosis not present

## 2013-01-12 DIAGNOSIS — S8990XA Unspecified injury of unspecified lower leg, initial encounter: Secondary | ICD-10-CM

## 2013-01-12 DIAGNOSIS — S0990XA Unspecified injury of head, initial encounter: Secondary | ICD-10-CM | POA: Diagnosis not present

## 2013-01-12 DIAGNOSIS — S51812A Laceration without foreign body of left forearm, initial encounter: Secondary | ICD-10-CM

## 2013-01-12 DIAGNOSIS — M6281 Muscle weakness (generalized): Secondary | ICD-10-CM | POA: Diagnosis not present

## 2013-01-12 DIAGNOSIS — W19XXXD Unspecified fall, subsequent encounter: Secondary | ICD-10-CM

## 2013-01-12 MED ORDER — ASPIRIN-DIPYRIDAMOLE ER 25-200 MG PO CP12: 1.0000 | ORAL_CAPSULE | Freq: Every day | ORAL | Status: DC

## 2013-01-12 NOTE — Progress Notes (Signed)
Is an 79 year old lady who fell while trying to help a friend into a car this afternoon. She's having significant pain when weightbearing on the left leg.  Patient complains of not on the leg hurting but lacerations to both her left leg and her right arm. She has large lacerations in those areas.  Patient experienced no head injury or loss of consciousness. She did denies any headache at this point or neck pain.  Objective: No acute distress Patient has a markedly antalgic gait [facility] reading (PRIMARY) by  Dr. Milus Glazier left knee x-ray is suspicious for plateau fracture.   [id number]

## 2013-01-12 NOTE — Progress Notes (Signed)
   Patient ID: KHANIYAH BEZEK MRN: [id number], DOB: 05-27-1922, 79 y.o. Date of Encounter: 01/12/2013, 6:12 PM   PROCEDURE NOTE: Verbal consent obtained.  Risks and benefits of the procedure were explained. Patient made an informed decision to proceed with the procedure.  Irrigated.  Wound explored, no deep structures involved, no foreign bodies.   Skin tear folded back along left lower leg and right forearm.  Dermabond applied followed by steri strips that were dermabonded down.  Hemostasis obtained. Wound cleansed and dressed.  Wound care instructions including precautions covered with patient. Handout given.   Signed, Eula Listen, PA-C 01/12/2013 6:12 PM Is an 79 year old very active woman who has been helping her neighbors. She fell trying to assist her friend this afternoon. She had excruciating knee pain and hobbled into the office with lacerations on her left lower leg and on her right forearm. She has a history of TIA in I recently prescribed Aggrenox to replace the aspirin because she had a TIA while on aspirin.  Objective: Patient is in no acute distress but was brought back urgently. There is a 10 cm laceration on her left lower calf and a 10 cm laceration on her right forearm. She is tender over the lateral aspect of the tibial plateau on the left. She is unable to bend her knee more than 30. There is no effusion in the knee however.  [facility] reading (PRIMARY) by  Dr. Milus Glazier: Left knee suspicious for plateau fracture  Assessment: After the repair for lacerations, a full knee immobilizer was placed on the left knee. She was assisted to her car and her neighbors offering her a walker. I called Dr. Luiz Blare who will be attending her on Thursday. He suggested that the walker and full knee immobilizer should be sufficient to protect the knee from further damage.  Plan: Followup appointment on Thursday (48 hours) with Dr. Luiz Blare. Patient to make appointment. Patient to keep dressings  in place until then. She is to use walker and keep the knee immobilizer on and less she's been eating. Knee injury, left, initial encounter - Plan: DG Knee Complete 4 Views Left  Leg injury, left, initial encounter - Plan: DG Tibia/Fibula Left  TIA (transient ischemic attack) - Plan: dipyridamole-aspirin (AGGRENOX) 200-25 MG per 12 hr capsule  Signed, Elvina Sidle, MD   Signed, Lewayne Bunting M.D..

## 2013-01-12 NOTE — ED Notes (Signed)
Per EMS pt walking became diaphoretic, dizzy and nauseated- didn't fall but felt like she was going to pass out. Pt used wall to brace self. @ 3:30 this afternoon pt fell was seen at [facility] and had bilateral arm skin tears dx with "crack in knee". Pt sts she feels lethargic and c/o pain in knee.   Pt sts she had a TIA last week and went to UC and CT came back normal. EKG- sinus arrythmia with some ST depression

## 2013-01-13 ENCOUNTER — Emergency Department (HOSPITAL_COMMUNITY): Payer: Medicare Other

## 2013-01-13 ENCOUNTER — Inpatient Hospital Stay (HOSPITAL_COMMUNITY): Payer: Medicare Other

## 2013-01-13 ENCOUNTER — Encounter (HOSPITAL_COMMUNITY): Payer: Self-pay | Admitting: Radiology

## 2013-01-13 DIAGNOSIS — W19XXXA Unspecified fall, initial encounter: Secondary | ICD-10-CM

## 2013-01-13 DIAGNOSIS — S0990XA Unspecified injury of head, initial encounter: Secondary | ICD-10-CM | POA: Diagnosis not present

## 2013-01-13 DIAGNOSIS — I369 Nonrheumatic tricuspid valve disorder, unspecified: Secondary | ICD-10-CM

## 2013-01-13 DIAGNOSIS — G459 Transient cerebral ischemic attack, unspecified: Secondary | ICD-10-CM | POA: Diagnosis present

## 2013-01-13 DIAGNOSIS — R197 Diarrhea, unspecified: Secondary | ICD-10-CM

## 2013-01-13 DIAGNOSIS — R55 Syncope and collapse: Secondary | ICD-10-CM

## 2013-01-13 DIAGNOSIS — S82143A Displaced bicondylar fracture of unspecified tibia, initial encounter for closed fracture: Secondary | ICD-10-CM

## 2013-01-13 DIAGNOSIS — K219 Gastro-esophageal reflux disease without esophagitis: Secondary | ICD-10-CM

## 2013-01-13 LAB — TROPONIN I: Troponin I: <0.3 ng/mL

## 2013-01-13 LAB — COMPREHENSIVE METABOLIC PANEL
BUN: 21 mg/dL (ref 6–23)
CO2: 26 mEq/L (ref 19–32)
Calcium: 8.6 mg/dL (ref 8.4–10.5)
Creatinine, Ser: 0.62 mg/dL (ref 0.50–1.10)
GFR calc Af Amer: >90 mL/min (ref >90)
GFR calc non Af Amer: 78 mL/min — ABNORMAL LOW (ref >90)
Glucose, Bld: 115 mg/dL — ABNORMAL HIGH (ref 70–99)

## 2013-01-13 LAB — GLUCOSE, CAPILLARY: Glucose-Capillary: 96 mg/dL (ref 70–99)

## 2013-01-13 LAB — CBC WITH DIFFERENTIAL/PLATELET
Eosinophils Relative: 0 % (ref 0–5)
HCT: 35.8 % — ABNORMAL LOW (ref 36.0–46.0)
Hemoglobin: 12.7 g/dL (ref 12.0–15.0)
Lymphocytes Relative: 14 % (ref 12–46)
Lymphs Abs: 1.4 10*3/uL (ref 0.7–4.0)
MCV: 88.6 fL (ref 78.0–100.0)
Monocytes Absolute: 0.7 10*3/uL (ref 0.1–1.0)
Monocytes Relative: 7 % (ref 3–12)
Neutro Abs: 8.3 10*3/uL — ABNORMAL HIGH (ref 1.7–7.7)
RBC: 4.04 MIL/uL (ref 3.87–5.11)
RDW: 13.9 % (ref 11.5–15.5)
WBC: 10.4 10*3/uL (ref 4.0–10.5)

## 2013-01-13 LAB — LIPID PANEL
Cholesterol: 147 mg/dL (ref 0–200)
HDL: 107 mg/dL (ref >39)
Total CHOL/HDL Ratio: 1.4 RATIO
Triglycerides: 48 mg/dL (ref <150)

## 2013-01-13 LAB — BASIC METABOLIC PANEL
BUN: 18 mg/dL (ref 6–23)
Chloride: 100 mEq/L (ref 96–112)
Creatinine, Ser: 0.62 mg/dL (ref 0.50–1.10)
GFR calc Af Amer: >90 mL/min (ref >90)
Glucose, Bld: 100 mg/dL — ABNORMAL HIGH (ref 70–99)

## 2013-01-13 LAB — HEMOGLOBIN A1C
Hgb A1c MFr Bld: 6.3 % — ABNORMAL HIGH (ref <5.7)
Mean Plasma Glucose: 134 mg/dL — ABNORMAL HIGH (ref <117)

## 2013-01-13 LAB — TSH: TSH: 1.844 u[IU]/mL (ref 0.350–4.500)

## 2013-01-13 LAB — CBC
HCT: 36.1 % (ref 36.0–46.0)
MCH: 30.1 pg (ref 26.0–34.0)
MCHC: 34.1 g/dL (ref 30.0–36.0)
MCV: 88.3 fL (ref 78.0–100.0)
Platelets: 171 10*3/uL (ref 150–400)
RDW: 14 % (ref 11.5–15.5)
WBC: 9 10*3/uL (ref 4.0–10.5)

## 2013-01-13 MED ORDER — ASPIRIN EC 325 MG PO TBEC
325.0000 mg | DELAYED_RELEASE_TABLET | Freq: Every day | ORAL | Status: DC
  Administered 2013-01-13 – 2013-01-18 (×6): 325 mg via ORAL
  Filled 2013-01-13 (×8): qty 1

## 2013-01-13 MED ORDER — MECLIZINE HCL 12.5 MG PO TABS
12.5000 mg | ORAL_TABLET | Freq: Three times a day (TID) | ORAL | Status: DC
  Administered 2013-01-13 – 2013-01-18 (×17): 12.5 mg via ORAL
  Filled 2013-01-13 (×18): qty 1

## 2013-01-13 MED ORDER — SODIUM CHLORIDE 0.9 % IV SOLN
INTRAVENOUS | Status: DC
  Administered 2013-01-13: 06:00:00 via INTRAVENOUS

## 2013-01-13 MED ORDER — IBUPROFEN 400 MG PO TABS
400.0000 mg | ORAL_TABLET | Freq: Once | ORAL | Status: AC
  Administered 2013-01-13: 400 mg via ORAL
  Filled 2013-01-13: qty 1

## 2013-01-13 MED ORDER — PANTOPRAZOLE SODIUM 40 MG PO TBEC
40.0000 mg | DELAYED_RELEASE_TABLET | Freq: Every day | ORAL | Status: DC
  Administered 2013-01-13 – 2013-01-18 (×6): 40 mg via ORAL
  Filled 2013-01-13 (×5): qty 1

## 2013-01-13 MED ORDER — ASPIRIN EC 81 MG PO TBEC
81.0000 mg | DELAYED_RELEASE_TABLET | Freq: Every day | ORAL | Status: DC
  Filled 2013-01-13: qty 1

## 2013-01-13 MED ORDER — IBUPROFEN 200 MG PO TABS
400.0000 mg | ORAL_TABLET | Freq: Once | ORAL | Status: AC
  Administered 2013-01-13: 400 mg via ORAL
  Filled 2013-01-13: qty 2

## 2013-01-13 MED ORDER — ENOXAPARIN SODIUM 40 MG/0.4ML ~~LOC~~ SOLN
40.0000 mg | SUBCUTANEOUS | Status: DC
  Administered 2013-01-13 – 2013-01-18 (×6): 40 mg via SUBCUTANEOUS
  Filled 2013-01-13 (×6): qty 0.4

## 2013-01-13 MED ORDER — LOPERAMIDE HCL 2 MG PO CAPS
2.0000 mg | ORAL_CAPSULE | Freq: Three times a day (TID) | ORAL | Status: DC
  Administered 2013-01-13 – 2013-01-18 (×17): 2 mg via ORAL
  Filled 2013-01-13 (×19): qty 1

## 2013-01-13 MED ORDER — ACETAMINOPHEN 325 MG PO TABS
650.0000 mg | ORAL_TABLET | ORAL | Status: DC | PRN
  Administered 2013-01-13 – 2013-01-14 (×2): 650 mg via ORAL
  Filled 2013-01-13: qty 2

## 2013-01-13 NOTE — ED Notes (Signed)
Dressing to L FA changed, dermabond applied by MD. Dressing to R arm reseccurred. No active bleeding noted to R FA. Small laceration to R back noted, band aid placed over top.

## 2013-01-13 NOTE — ED Notes (Signed)
Pt complaining of pain 9/10 to both arms, L knee. Dressing loosened to R forearm. Moderate amount of serosanguinous drainage noted to forearm.

## 2013-01-13 NOTE — Consult Note (Signed)
Reason for Consult: l tibial plateau fx. Referring Physician:  hospitalists  [name] is an 79 y.o. female.  HPI: pt fell and c/o l knee pain and was admitted for evaluation of falling and for inability to take care of herself because of inability to ambulate well andn she lives alone.  We are consulted for management of tibial plateau fx.  Past Medical History  Diagnosis Date  . Arthritis   . Cancer     squamous cell  . Incontinence of urine     Past Surgical History  Procedure Laterality Date  . Breast surgery  1981    breast  . Tonsillectomy    . Abdominal hysterectomy      Family History  Problem Relation Age of Onset  . Arthritis Mother   . Cancer Sister     breast    Social History:  reports that she quit smoking about 35 years ago. Her smoking use included Cigarettes. She has a 15 pack-year smoking history. She does not have any smokeless tobacco history on file. Her alcohol and drug histories are not on file.  Allergies:  Allergies  Allergen Reactions  . Penicillin G Itching  . Septra [Bactrim]     Stomach concerns.     Medications: I have reviewed the patient's current medications.  Results for orders placed during the hospital encounter of 01/12/13 (from the past 48 hour(s))  CBC WITH DIFFERENTIAL     Status: Abnormal   Collection Time    01/13/13  1:55 AM      Result Value Range   WBC 10.4  4.0 - 10.5 K/uL   RBC 4.04  3.87 - 5.11 MIL/uL   Hemoglobin 12.7  12.0 - 15.0 g/dL   HCT 96.0 (*) 45.4 - 09.8 %   MCV 88.6  78.0 - 100.0 fL   MCH 31.4  26.0 - 34.0 pg   MCHC 35.5  30.0 - 36.0 g/dL   RDW 11.9  14.7 - 82.9 %   Platelets 166  150 - 400 K/uL   Neutrophils Relative % 80 (*) 43 - 77 %   Neutro Abs 8.3 (*) 1.7 - 7.7 K/uL   Lymphocytes Relative 14  12 - 46 %   Lymphs Abs 1.4  0.7 - 4.0 K/uL   Monocytes Relative 7  3 - 12 %   Monocytes Absolute 0.7  0.1 - 1.0 K/uL   Eosinophils Relative 0  0 - 5 %   Eosinophils Absolute 0.0  0.0 - 0.7 K/uL    Basophils Relative 0  0 - 1 %   Basophils Absolute 0.0  0.0 - 0.1 K/uL  COMPREHENSIVE METABOLIC PANEL     Status: Abnormal   Collection Time    01/13/13  1:55 AM      Result Value Range   Sodium 140  135 - 145 mEq/L   Potassium 4.5  3.5 - 5.1 mEq/L   Chloride 103  96 - 112 mEq/L   CO2 26  19 - 32 mEq/L   Glucose, Bld 115 (*) 70 - 99 mg/dL   BUN 21  6 - 23 mg/dL   Creatinine, Ser 5.62  0.50 - 1.10 mg/dL   Calcium 8.6  8.4 - 13.0 mg/dL   Total Protein 5.8 (*) 6.0 - 8.3 g/dL   Albumin 3.6  3.5 - 5.2 g/dL   AST 29  0 - 37 U/L   ALT 19  0 - 35 U/L   Alkaline Phosphatase 63  39 - 117 U/L   Total Bilirubin 0.4  0.3 - 1.2 mg/dL   GFR calc non Af Amer 78 (*) >90 mL/min   GFR calc Af Amer >90  >90 mL/min   Comment: (NOTE)     The eGFR has been calculated using the CKD EPI equation.     This calculation has not been validated in all clinical situations.     eGFR's persistently <90 mL/min signify possible Chronic Kidney     Disease.  TROPONIN I     Status: [name]   Collection Time    01/13/13  1:55 AM      Result Value Range   Troponin I <0.30  <0.30 ng/mL   Comment:            Due to the release kinetics of cTnI,     a negative result within the first hours     of the onset of symptoms does not rule out     myocardial infarction with certainty.     If myocardial infarction is still suspected,     repeat the test at appropriate intervals.  TSH     Status: [name]   Collection Time    01/13/13  3:30 AM      Result Value Range   TSH 1.844  0.350 - 4.500 uIU/mL   Comment: Performed at [facility]  LIPID PANEL     Status: [name]   Collection Time    01/13/13 11:30 AM      Result Value Range   Cholesterol 147  0 - 200 mg/dL   Triglycerides 48  <161 mg/dL   HDL 096  >04 mg/dL   Total CHOL/HDL Ratio 1.4     VLDL 10  0 - 40 mg/dL   LDL Cholesterol 30  0 - 99 mg/dL   Comment:            Total Cholesterol/HDL:CHD Risk     Coronary Heart Disease Risk Table                          Men   Women      1/2 Average Risk   3.4   3.3      Average Risk       5.0   4.4      2 X Average Risk   9.6   7.1      3 X Average Risk  23.4   11.0                Use the calculated Patient Ratio     above and the CHD Risk Table     to determine the patient's CHD Risk.                ATP III CLASSIFICATION (LDL):      <100     mg/dL   Optimal      540-981  mg/dL   Near or Above                        Optimal      130-159  mg/dL   Borderline      191-478  mg/dL   High      >295     mg/dL   Very High  CBC     Status: [name]   Collection Time    01/13/13 11:30 AM      Result  Value Range   WBC 9.0  4.0 - 10.5 K/uL   RBC 4.09  3.87 - 5.11 MIL/uL   Hemoglobin 12.3  12.0 - 15.0 g/dL   HCT 16.1  09.6 - 04.5 %   MCV 88.3  78.0 - 100.0 fL   MCH 30.1  26.0 - 34.0 pg   MCHC 34.1  30.0 - 36.0 g/dL   RDW 40.9  81.1 - 91.4 %   Platelets 171  150 - 400 K/uL  BASIC METABOLIC PANEL     Status: Abnormal   Collection Time    01/13/13 11:30 AM      Result Value Range   Sodium 135  135 - 145 mEq/L   Potassium 4.4  3.5 - 5.1 mEq/L   Chloride 100  96 - 112 mEq/L   CO2 25  19 - 32 mEq/L   Glucose, Bld 100 (*) 70 - 99 mg/dL   BUN 18  6 - 23 mg/dL   Creatinine, Ser 7.82  0.50 - 1.10 mg/dL   Calcium 8.6  8.4 - 95.6 mg/dL   GFR calc non Af Amer 78 (*) >90 mL/min   GFR calc Af Amer >90  >90 mL/min   Comment: (NOTE)     The eGFR has been calculated using the CKD EPI equation.     This calculation has not been validated in all clinical situations.     eGFR's persistently <90 mL/min signify possible Chronic Kidney     Disease.  GLUCOSE, CAPILLARY     Status: Abnormal   Collection Time    01/13/13  1:05 PM      Result Value Range   Glucose-Capillary 131 (*) 70 - 99 mg/dL  GLUCOSE, CAPILLARY     Status: [name]   Collection Time    01/13/13  4:44 PM      Result Value Range   Glucose-Capillary 96  70 - 99 mg/dL   Comment 1 Documented in Chart      Dg Tibia/fibula Left  01/12/2013    *RADIOLOGY REPORT*  Clinical Data: Left knee pain status post fall  LEFT TIBIA AND FIBULA - 2 VIEW  Comparison: Concurrently obtained radiographs of the left knee  Findings: Irregularity of the cortical trabecular pattern in the lateral tibial plateau better demonstrated on concurrently obtained radiographs of the knee.  No evidence of acute fracture in the mid or distal tibia or fibula.  Scattered atherosclerotic calcifications are noted.  No ankle joint effusion.  Visualized bones and joints of the foot are unremarkable.  IMPRESSION: Probable impacted lateral tibial plateau fracture.  No evidence of fracture or malalignment in the mid or distal lower leg.  Clinically significant discrepancy from primary report, if provided: [name]   Original Report Authenticated By: Malachy Moan, M.D.   Ct Head Wo Contrast  01/13/2013   CLINICAL DATA:  Near syncope  EXAM: CT HEAD WITHOUT CONTRAST  TECHNIQUE: Contiguous axial images were obtained from the base of the skull through the vertex without intravenous contrast.  COMPARISON:  01/07/2013  FINDINGS: Skull:No acute osseous abnormality. No lytic or blastic lesion.  Orbits: Bilateral cataract resection.  Brain: No evidence of acute abnormality, such as acute infarction, hemorrhage, hydrocephalus, or mass lesion/mass effect. Unchanged pattern of bilateral cerebral white matter low-attenuation, confluent around the lateral ventricles. Global brain atrophy, commensurate with age. Intracranial atherosclerosis.  IMPRESSION: 1. No evidence of acute intracranial disease. 2. Senescent brain changes, age appropriate.   Electronically Signed   By: Tiburcio Pea  On: 01/13/2013 02:30   Mri Brain Without Contrast  01/13/2013   CLINICAL DATA:  TIA. Frequent falls recently. Dizziness.  EXAM: MRI HEAD WITHOUT CONTRAST  MRA HEAD WITHOUT CONTRAST  TECHNIQUE: Multiplanar, multiecho pulse sequences of the brain and surrounding structures were obtained without intravenous contrast.  Angiographic images of the head were obtained using MRA technique without contrast.  COMPARISON:  Did CT head without contrast 01/13/2013.  FINDINGS: MRI HEAD FINDINGS  Midline intracranial structures are within normal limits for age. Moderate degenerative changes are evident at multiple levels in the upper cervical spine, including C1-2. Diffusion-weighted images demonstrate no evidence for acute or subacute infarction. Mild generalized atrophy and periventricular white matter changes are evident bilaterally. No hemorrhage or mass lesion is present. With Flow is present in the major intracranial arteries. The patient is status post bilateral lens extractions. The paranasal sinuses and mastoid air cells are clear.  MRA HEAD FINDINGS  The internal carotid arteries are within normal limits from the high cervical segments through the ICA termini bilaterally. The A1 and M1 segments are normal. The ACA and MCA branch vessels are within normal limits for age. No significant proximal stenosis, aneurysm, or branch vessel occlusion is present.  The left vertebral artery is slightly dominant to the right. The left PICA origin is visualized and within normal limits. The right AICA is dominant. The basilar artery is within normal limits. Both posterior cerebral arteries originate from the basilar tip. The PCA branch vessels are within normal limits bilaterally.  IMPRESSION: MRI HEAD IMPRESSION  1. Mild generalized atrophy and white matter disease is likely within normal limits for age. 2. No acute intracranial abnormality.  MRA HEAD IMPRESSION  1. Normal MRA of the brain for age. 2. No evidence for significant proximal stenosis, aneurysm, or branch vessel occlusion.   Electronically Signed   By: Gennette Pac   On: 01/13/2013 17:04   Dg Chest Port 1 View  01/13/2013   CLINICAL DATA:  Syncope.  EXAM: PORTABLE CHEST - 1 VIEW  COMPARISON:  [name].  FINDINGS: Heart size and mediastinal contours are within normal limits. Both  lungs are clear. Visualized skeletal structures are unremarkable.  IMPRESSION: No active disease.   Electronically Signed   By: Drusilla Kanner M.D.   On: 01/13/2013 02:13   Dg Knee Complete 4 Views Left  01/12/2013   *RADIOLOGY REPORT*  Clinical Data: Fall, knee pain  LEFT KNEE - COMPLETE 4+ VIEW  Comparison: Prior knee radiographs 08/31/2011  Findings: Large knee joint effusion.  Trabecular irregularity in the lateral tibial plateau concerning for impacted plateau fracture.  Additionally, there is a subtle lucency.  The bones are mildly osteopenic.  IMPRESSION: Agree with preliminary report: probable impacted lateral tibial plateau fracture with associated large suprapatellar joint effusion.  Atherosclerotic calcifications in the superficial femoral artery.  Clinically significant discrepancy from primary report, if provided: [name]   Original Report Authenticated By: Malachy Moan, M.D.   Mr Mra Head/brain Wo Cm  01/13/2013   CLINICAL DATA:  TIA. Frequent falls recently. Dizziness.  EXAM: MRI HEAD WITHOUT CONTRAST  MRA HEAD WITHOUT CONTRAST  TECHNIQUE: Multiplanar, multiecho pulse sequences of the brain and surrounding structures were obtained without intravenous contrast. Angiographic images of the head were obtained using MRA technique without contrast.  COMPARISON:  Did CT head without contrast 01/13/2013.  FINDINGS: MRI HEAD FINDINGS  Midline intracranial structures are within normal limits for age. Moderate degenerative changes are evident at multiple levels in the upper cervical spine, including  C1-2. Diffusion-weighted images demonstrate no evidence for acute or subacute infarction. Mild generalized atrophy and periventricular white matter changes are evident bilaterally. No hemorrhage or mass lesion is present. With Flow is present in the major intracranial arteries. The patient is status post bilateral lens extractions. The paranasal sinuses and mastoid air cells are clear.  MRA HEAD FINDINGS   The internal carotid arteries are within normal limits from the high cervical segments through the ICA termini bilaterally. The A1 and M1 segments are normal. The ACA and MCA branch vessels are within normal limits for age. No significant proximal stenosis, aneurysm, or branch vessel occlusion is present.  The left vertebral artery is slightly dominant to the right. The left PICA origin is visualized and within normal limits. The right AICA is dominant. The basilar artery is within normal limits. Both posterior cerebral arteries originate from the basilar tip. The PCA branch vessels are within normal limits bilaterally.  IMPRESSION: MRI HEAD IMPRESSION  1. Mild generalized atrophy and white matter disease is likely within normal limits for age. 2. No acute intracranial abnormality.  MRA HEAD IMPRESSION  1. Normal MRA of the brain for age. 2. No evidence for significant proximal stenosis, aneurysm, or branch vessel occlusion.   Electronically Signed   By: Gennette Pac   On: 01/13/2013 17:04    ROS: I have reviewed the patient's review of systems thoroughly and there are no positive responses as relates to the HPI.  EXAM: Blood pressure 147/62, pulse 73, temperature 98.3 F (36.8 C), temperature source Oral, resp. rate 18, height 5\' 1"  (1.549 m), weight 48.807 kg (107 lb 9.6 oz), SpO2 93.00%. Well-developed well-nourished patient in no acute distress. Alert and oriented x3 HEENT:within normal limits Cardiac: Regular rate and rhythm Pulmonary: Lungs clear to auscultation Abdomen: Soft and nontender.  Normal active bowel sounds  Musculoskeletal: l knee: painful rom. +ttp over lat jt line  Assessment/Plan: Elderly female with l lat tibial plateau fx.//.  Pt may touch down wt bear on l leg and knee immobilizer all times. Will follow in and out of hospital.    Melecio Cueto L 01/13/2013, 5:29 PM

## 2013-01-13 NOTE — ED Notes (Signed)
Patient returned from CT

## 2013-01-13 NOTE — Evaluation (Signed)
I have reviewed this note and agree with all findings. Kati Chin Wachter, PT, DPT Pager: [phone]   

## 2013-01-13 NOTE — Progress Notes (Signed)
*  PRELIMINARY RESULTS* Vascular Ultrasound Carotid Duplex (Doppler) has been completed.  Preliminary findings: Bilateral:  1-39% ICA stenosis.  Vertebral artery flow is antegrade.     Farrel Demark, RDMS, RVT  01/13/2013, 3:03 PM

## 2013-01-13 NOTE — H&P (Signed)
[facility] History and Physical  [name]:[id number] DOB: Nov 25, 1922 DOA: 01/12/2013  Referring physician: ED physician PCP: Kristian Covey, MD   Chief Complaint: falls  HPI:  79 year old female relatively healthy and independent who lives at home alone, presents to [facility] ED with main concern of frequent falls at home and the last occuring earlier this afternoon prior to this admission. She explains she has had similar episode in the past about one week ago and was seen at the urgent care and told she did not have a stroke and she was sent home. In addition, she was told she had left fracture of the leg (tibial fx) and was scheduled an appointment with Dr. Luiz Blare Sept 25th, 2014. This afternoon, she explains she felt somewhat dizzy and lost her balance and fell. She is worried that she can no longer continue to care for herself at home. She denies chest pain or shortness of breath, no abdominal or urinary concerns, no specific focal neurological symptoms.   In ED, pt is hemodynamically stale but feels unsteady on her week upon attempted ambulation. [facility] asked to admit for completion of TIA work up and evaluation of frequent falls at home. Telemetry bed requested.   Assessment and Plan:  Principal Problem:   Falls - unclear if mechanical in nature vs provoked by TIA, possibly progressive deconditioning  - agree with admission for completion of TIA work up - I wll admit to telemetry bed - order MRI brain for further evaluation - check TSH and 12 lead EKG, lipid panel and A1C - PT/OT evaluation, 2 D ECHO Active Problems:   TIA (transient ischemic attack) - management as noted above   Tibial plateau fracture - has an appointment with Dr. Valentino Hue on Sept 25th, 2014   GERD (gastroesophageal reflux disease) - continue Protonix   Code Status: Full Family Communication: Pt at bedside Disposition Plan: Admit to telemetry bed   Review of Systems:  Constitutional: Negative  for diaphoresis.  HENT: Negative for hearing loss, ear pain, nosebleeds, congestion, sore throat, neck pain, tinnitus and ear discharge.   Eyes: Negative for blurred vision, double vision, photophobia, pain, discharge and redness.  Respiratory: Negative for cough, hemoptysis, sputum production, shortness of breath, wheezing and stridor.   Cardiovascular: Negative for chest pain, palpitations, orthopnea, claudication and leg swelling.  Gastrointestinal: Negative for nausea, vomiting and abdominal pain. Negative for heartburn, constipation, blood in stool and melena.  Genitourinary: Negative for dysuria,hematuria and flank pain.  Musculoskeletal: Negative for myalgias, back pain.  Skin: Negative for itching and rash.  Neurological: Negative for tingling, tremors, sensory change, speech change, focal weakness, and headaches.  Endo/Heme/Allergies: Negative for environmental allergies and polydipsia. Does not bruise/bleed easily.  Psychiatric/Behavioral: Negative for suicidal ideas. The patient is not nervous/anxious.      Past Medical History  Diagnosis Date  . Arthritis   . Cancer     squamous cell  . Incontinence of urine     Past Surgical History  Procedure Laterality Date  . Breast surgery  1981    breast  . Tonsillectomy    . Abdominal hysterectomy      Social History:  reports that she quit smoking about 35 years ago. Her smoking use included Cigarettes. She has a 15 pack-year smoking history. She does not have any smokeless tobacco history on file. Her alcohol and drug histories are not on file.  Allergies  Allergen Reactions  . Penicillin G Itching  . Septra [Bactrim]  Stomach concerns.     Family History  Problem Relation Age of Onset  . Arthritis Mother   . Cancer Sister     breast    Prior to Admission medications   Medication Sig Start Date End Date Taking? Authorizing Provider  Ascorbic Acid (VITAMIN C) 100 MG tablet Take 100 mg by mouth daily.   Yes  Historical Provider, MD  aspirin EC 81 MG tablet Take 81 mg by mouth daily.   Yes Historical Provider, MD  Calcium Carbonate-Vitamin D (CALCIUM 600 + D PO) Take 1 tablet by mouth daily.   Yes Historical Provider, MD  Cyanocobalamin (VITAMIN B 12 PO) Take 1 tablet by mouth daily.   Yes Historical Provider, MD  esomeprazole (NEXIUM) 40 MG capsule Take 1 capsule (40 mg total) by mouth daily before breakfast. 02/21/12  Yes Kristian Covey, MD  Loperamide HCl (IMODIUM PO) Take 1 each by mouth 3 (three) times daily. Per Dr Lauralyn Primes   Yes Historical Provider, MD  Multiple Vitamin (MULTIVITAMIN) tablet Take 1 tablet by mouth daily.   Yes Historical Provider, MD  Multiple Vitamins-Minerals (OCUVITE PO) Take 1 tablet by mouth daily.   Yes Historical Provider, MD    Physical Exam: Filed Vitals:   01/12/13 2315 01/12/13 2345 01/13/13 0015 01/13/13 0045  BP: 144/66 142/67 139/73 148/66  Pulse: 76 78 75 79  Temp:      TempSrc:      Resp: 18 17 13 15   SpO2: 96% 94% 95% 95%    Physical Exam  Constitutional: Appears well-developed and well-nourished. No distress.  HENT: Normocephalic. External right and left ear normal. Oropharynx is clear and moist.  Eyes: Conjunctivae and EOM are normal. PERRLA, no scleral icterus.  Neck: Normal ROM. Neck supple. No JVD. No tracheal deviation. No thyromegaly.  CVS: RRR, S1/S2 +, no murmurs, no gallops, no carotid bruit.  Pulmonary: Effort and breath sounds normal, no stridor, rhonchi, wheezes, rales.  Abdominal: Soft. BS +,  no distension, tenderness, rebound or guarding.  Musculoskeletal: Normal range of motion. Left leg in immobilizer .  Lymphadenopathy: No lymphadenopathy noted, cervical, inguinal. Neuro: Alert. Normal reflexes, muscle tone coordination. No cranial nerve deficit. Skin: Skin is warm and dry. No rash noted. Not diaphoretic. No erythema. No pallor.  Psychiatric: Normal mood and affect. Behavior, judgment, thought content normal.   Labs on Admission:   Basic Metabolic Panel:  Recent Labs Lab 01/13/13 0155  NA 140  K 4.5  CL 103  CO2 26  GLUCOSE 115*  BUN 21  CREATININE 0.62  CALCIUM 8.6   Liver Function Tests:  Recent Labs Lab 01/13/13 0155  AST 29  ALT 19  ALKPHOS 63  BILITOT 0.4  PROT 5.8*  ALBUMIN 3.6   CBC:  Recent Labs Lab 01/13/13 0155  WBC 10.4  NEUTROABS 8.3*  HGB 12.7  HCT 35.8*  MCV 88.6  PLT 166   Cardiac Enzymes:  Recent Labs Lab 01/13/13 0155  TROPONINI <0.30    Radiological Exams on Admission: Dg Tibia/fibula Left  01/12/2013   *RADIOLOGY REPORT*  Clinical Data: Left knee pain status post fall  LEFT TIBIA AND FIBULA - 2 VIEW  Comparison: Concurrently obtained radiographs of the left knee  Findings: Irregularity of the cortical trabecular pattern in the lateral tibial plateau better demonstrated on concurrently obtained radiographs of the knee.  No evidence of acute fracture in the mid or distal tibia or fibula.  Scattered atherosclerotic calcifications are noted.  No ankle joint effusion.  Visualized  bones and joints of the foot are unremarkable.  IMPRESSION: Probable impacted lateral tibial plateau fracture.  No evidence of fracture or malalignment in the mid or distal lower leg.  Clinically significant discrepancy from primary report, if provided: None   Original Report Authenticated By: Malachy Moan, M.D.   Ct Head Wo Contrast  01/13/2013   CLINICAL DATA:  Near syncope  EXAM: CT HEAD WITHOUT CONTRAST  TECHNIQUE: Contiguous axial images were obtained from the base of the skull through the vertex without intravenous contrast.  COMPARISON:  01/07/2013  FINDINGS: Skull:No acute osseous abnormality. No lytic or blastic lesion.  Orbits: Bilateral cataract resection.  Brain: No evidence of acute abnormality, such as acute infarction, hemorrhage, hydrocephalus, or mass lesion/mass effect. Unchanged pattern of bilateral cerebral white matter low-attenuation, confluent around the lateral ventricles.  Global brain atrophy, commensurate with age. Intracranial atherosclerosis.  IMPRESSION: 1. No evidence of acute intracranial disease. 2. Senescent brain changes, age appropriate.   Electronically Signed   By: Tiburcio Pea   On: 01/13/2013 02:30   Dg Chest Port 1 View  01/13/2013   CLINICAL DATA:  Syncope.  EXAM: PORTABLE CHEST - 1 VIEW  COMPARISON:  None.  FINDINGS: Heart size and mediastinal contours are within normal limits. Both lungs are clear. Visualized skeletal structures are unremarkable.  IMPRESSION: No active disease.   Electronically Signed   By: Drusilla Kanner M.D.   On: 01/13/2013 02:13   Dg Knee Complete 4 Views Left  01/12/2013   *RADIOLOGY REPORT*  Clinical Data: Fall, knee pain  LEFT KNEE - COMPLETE 4+ VIEW  Comparison: Prior knee radiographs 08/31/2011  Findings: Large knee joint effusion.  Trabecular irregularity in the lateral tibial plateau concerning for impacted plateau fracture.  Additionally, there is a subtle lucency.  The bones are mildly osteopenic.  IMPRESSION: Agree with preliminary report: probable impacted lateral tibial plateau fracture with associated large suprapatellar joint effusion.  Atherosclerotic calcifications in the superficial femoral artery.  Clinically significant discrepancy from primary report, if provided: None   Original Report Authenticated By: Malachy Moan, M.D.    EKG: Normal sinus rhythm, no ST/T wave changes  Debbora Presto, MD  [facility] Pager [phone]  If 7PM-7AM, please contact night-coverage [email or web address] Password [facility] 01/13/2013, 3:19 AM

## 2013-01-13 NOTE — Progress Notes (Signed)
  Echocardiogram 2D Echocardiogram has been performed.  Zita Ozimek FRANCES 01/13/2013, 3:12 PM

## 2013-01-13 NOTE — ED Provider Notes (Addendum)
CSN: [id number]     Arrival date & time 01/12/13  2218 History   First MD Initiated Contact with Patient 01/13/13 0127     Chief Complaint  Patient presents with  . Near Syncope   (Consider location/radiation/quality/duration/timing/severity/associated sxs/prior Treatment) HPI 79 year old female came in because of dizziness and near syncope. She had an episode this afternoon where she lost her balance and fell and was seen in urgent care where she was noted to have a tibial plateau fracture and lacerations on her arms. She was discharged with a knee immobilizer and orthopedic followup. When walking to get into her home, and she had a lot of pain in her knee and got lightheaded and felt like she was going to pass out. She did not actually pass out she denies any chest pain. She is feeling somewhat better now that she is laying flat. She is staying by herself and there was a lot of concern that she was not in a position where it was safe for her to stay by herself. Of note, she had been seen one week ago for a transient ischemic attack which is manifested by visual loss and inability to speak. She did have workup with carotid ultrasound and head CT but did not have MRI scans or echocardiogram. She was placed on aspirin following the transient ischemic attack.  Past Medical History  Diagnosis Date  . Arthritis   . Cancer     squamous cell  . Incontinence of urine    Past Surgical History  Procedure Laterality Date  . Breast surgery  1981    breast  . Tonsillectomy    . Abdominal hysterectomy     Family History  Problem Relation Age of Onset  . Arthritis Mother   . Cancer Sister     breast   History  Substance Use Topics  . Smoking status: Former Smoker -- 1.00 packs/day for 15 years    Types: Cigarettes    Quit date: 03/20/1977  . Smokeless tobacco: Not on file  . Alcohol Use: Not on file   OB History   Grav Para Term Preterm Abortions TAB SAB Ect Mult Living                  Review of Systems  Allergies  Penicillin g and Septra  Home Medications   Current Outpatient Rx  Name  Route  Sig  Dispense  Refill  . Ascorbic Acid (VITAMIN C) 100 MG tablet   Oral   Take 100 mg by mouth daily.         Marland Kitchen aspirin EC 81 MG tablet   Oral   Take 81 mg by mouth daily.         . Calcium Carbonate-Vitamin D (CALCIUM 600 + D PO)   Oral   Take 1 tablet by mouth daily.         . Cyanocobalamin (VITAMIN B 12 PO)   Oral   Take 1 tablet by mouth daily.         Marland Kitchen esomeprazole (NEXIUM) 40 MG capsule   Oral   Take 1 capsule (40 mg total) by mouth daily before breakfast.   90 capsule   3   . Loperamide HCl (IMODIUM PO)   Oral   Take 1 each by mouth 3 (three) times daily. Per Dr Lauralyn Primes         . Multiple Vitamin (MULTIVITAMIN) tablet   Oral   Take 1 tablet by mouth daily.         Marland Kitchen  Multiple Vitamins-Minerals (OCUVITE PO)   Oral   Take 1 tablet by mouth daily.          BP 148/66  Pulse 79  Temp(Src) 98.9 F (37.2 C) (Oral)  Resp 15  SpO2 95% Physical Exam 79 year old female, resting comfortably and in no acute distress. Vital signs are significant for hypertension with blood pressure 140/66 . Oxygen saturation is 95%, which is normal. Head is normocephalic and atraumatic. PERRLA, EOMI. Oropharynx is clear. Neck is nontender and supple without adenopathy or JVD. there no carotid bruits.  Back is nontender and there is no CVA tenderness. Lungs are clear without rales, wheezes, or rhonchi. Chest is nontender. Heart has regular rate and rhythm without murmur. Abdomen is soft, flat, nontender without masses or hepatosplenomegaly and peristalsis is normoactive. Extremities: Left leg is in a knee immobilizer which is not removed. Dressings are present on both forearms which are not removed. There is no cyanosis or edema. Significant ecchymosis is noted in the distal right forearm. Skin is warm and dry without rash. Neurologic: Mental status is  normal, cranial nerves are intact, there are no motor or sensory deficits.  ED Course  Procedures (including critical care time) Labs Review Results for orders placed during the hospital encounter of 01/12/13  CBC WITH DIFFERENTIAL      Result Value Range   WBC 10.4  4.0 - 10.5 K/uL   RBC 4.04  3.87 - 5.11 MIL/uL   Hemoglobin 12.7  12.0 - 15.0 g/dL   HCT 16.1 (*) 09.6 - 04.5 %   MCV 88.6  78.0 - 100.0 fL   MCH 31.4  26.0 - 34.0 pg   MCHC 35.5  30.0 - 36.0 g/dL   RDW 40.9  81.1 - 91.4 %   Platelets 166  150 - 400 K/uL   Neutrophils Relative % 80 (*) 43 - 77 %   Neutro Abs 8.3 (*) 1.7 - 7.7 K/uL   Lymphocytes Relative 14  12 - 46 %   Lymphs Abs 1.4  0.7 - 4.0 K/uL   Monocytes Relative 7  3 - 12 %   Monocytes Absolute 0.7  0.1 - 1.0 K/uL   Eosinophils Relative 0  0 - 5 %   Eosinophils Absolute 0.0  0.0 - 0.7 K/uL   Basophils Relative 0  0 - 1 %   Basophils Absolute 0.0  0.0 - 0.1 K/uL  COMPREHENSIVE METABOLIC PANEL      Result Value Range   Sodium 140  135 - 145 mEq/L   Potassium 4.5  3.5 - 5.1 mEq/L   Chloride 103  96 - 112 mEq/L   CO2 26  19 - 32 mEq/L   Glucose, Bld 115 (*) 70 - 99 mg/dL   BUN 21  6 - 23 mg/dL   Creatinine, Ser 7.82  0.50 - 1.10 mg/dL   Calcium 8.6  8.4 - 95.6 mg/dL   Total Protein 5.8 (*) 6.0 - 8.3 g/dL   Albumin 3.6  3.5 - 5.2 g/dL   AST 29  0 - 37 U/L   ALT 19  0 - 35 U/L   Alkaline Phosphatase 63  39 - 117 U/L   Total Bilirubin 0.4  0.3 - 1.2 mg/dL   GFR calc non Af Amer 78 (*) >90 mL/min   GFR calc Af Amer >90  >90 mL/min  TROPONIN I      Result Value Range   Troponin I <0.30  <0.30 ng/mL   Dg  Tibia/fibula Left  01/12/2013   *RADIOLOGY REPORT*  Clinical Data: Left knee pain status post fall  LEFT TIBIA AND FIBULA - 2 VIEW  Comparison: Concurrently obtained radiographs of the left knee  Findings: Irregularity of the cortical trabecular pattern in the lateral tibial plateau better demonstrated on concurrently obtained radiographs of the knee.  No  evidence of acute fracture in the mid or distal tibia or fibula.  Scattered atherosclerotic calcifications are noted.  No ankle joint effusion.  Visualized bones and joints of the foot are unremarkable.  IMPRESSION: Probable impacted lateral tibial plateau fracture.  No evidence of fracture or malalignment in the mid or distal lower leg.  Clinically significant discrepancy from primary report, if provided: None   Original Report Authenticated By: Malachy Moan, M.D.   Ct Head Wo Contrast  01/13/2013   CLINICAL DATA:  Near syncope  EXAM: CT HEAD WITHOUT CONTRAST  TECHNIQUE: Contiguous axial images were obtained from the base of the skull through the vertex without intravenous contrast.  COMPARISON:  01/07/2013  FINDINGS: Skull:No acute osseous abnormality. No lytic or blastic lesion.  Orbits: Bilateral cataract resection.  Brain: No evidence of acute abnormality, such as acute infarction, hemorrhage, hydrocephalus, or mass lesion/mass effect. Unchanged pattern of bilateral cerebral white matter low-attenuation, confluent around the lateral ventricles. Global brain atrophy, commensurate with age. Intracranial atherosclerosis.  IMPRESSION: 1. No evidence of acute intracranial disease. 2. Senescent brain changes, age appropriate.   Electronically Signed   By: Tiburcio Pea   On: 01/13/2013 02:30   Ct Head Wo Contrast  01/07/2013   CLINICAL DATA:  Headache. TIA.  EXAM: CT HEAD WITHOUT CONTRAST  TECHNIQUE: Contiguous axial images were obtained from the base of the skull through the vertex without intravenous contrast.  COMPARISON:  CT 08/29/2011  FINDINGS: Generalized atrophy. Mild chronic microvascular ischemic change in the white matter. Negative for hydrocephalus.  Negative for acute infarct, hemorrhage, or mass lesion.  Calvarium is intact.  IMPRESSION: Atrophy and chronic microvascular ischemic change. No acute abnormality and no change from the prior CT.   Electronically Signed   By: Marlan Palau M.D.    On: 01/07/2013 11:40   US Carotid Duplex Bilateral  01/07/2013   *RADIOLOGY REPORT*  Clinical Data: Transient blurred vision, possible transient ischemic attack  BILATERAL CAROTID DUPLEX ULTRASOUND  Technique: Wallace Cullens scale imaging, color Doppler and duplex ultrasound were performed of bilateral carotid and vertebral arteries in the neck.  Comparison:  Head CT 08/29/2011; prior duplex carotid ultrasound 01/16/2011  Criteria:  Quantification of carotid stenosis is based on velocity parameters that correlate the residual internal carotid diameter with NASCET-based stenosis levels, using the diameter of the distal internal carotid lumen as the denominator for stenosis measurement.  The following velocity measurements were obtained:                   PEAK SYSTOLIC/END DIASTOLIC RIGHT ICA:                        76/13cm/sec CCA:                        92/11cm/sec SYSTOLIC ICA/CCA RATIO:     0.8 DIASTOLIC ICA/CCA RATIO:    1.2 ECA:                        67cm/sec  LEFT ICA:  56/14cm/sec CCA:                        72/11cm/sec SYSTOLIC ICA/CCA RATIO:     0.8 DIASTOLIC ICA/CCA RATIO:    1.3 ECA:                        70cm/sec  Findings:  RIGHT CAROTID ARTERY: Trace heterogeneous atherosclerotic plaque in the carotid bulb extending into the proximal internal and external carotid arteries.  No significant stenosis by gray scale, color Doppler, pulsed Doppler or spectral waveform analysis.  RIGHT VERTEBRAL ARTERY:  Patent with normal antegrade flow.  LEFT CAROTID ARTERY: Trace calcified atherosclerotic plaque in the carotid bifurcations without evidence of significant stenosis by gray scale, color Doppler, pulsed Doppler or spectral waveform analysis.  LEFT VERTEBRAL ARTERY:  Patent with normal antegrade flow.  IMPRESSION:  Trace atherosclerotic plaque results in less than 50% stenoses of the bilateral internal carotid arteries.  No significant interval progression compared to 01/16/2011.  Signed,  Sterling Big, MD [facility]   Original Report Authenticated By: Malachy Moan, M.D.   Dg Chest Port 1 View  01/13/2013   CLINICAL DATA:  Syncope.  EXAM: PORTABLE CHEST - 1 VIEW  COMPARISON:  None.  FINDINGS: Heart size and mediastinal contours are within normal limits. Both lungs are clear. Visualized skeletal structures are unremarkable.  IMPRESSION: No active disease.   Electronically Signed   By: Drusilla Kanner M.D.   On: 01/13/2013 02:13   Dg Knee Complete 4 Views Left  01/12/2013   *RADIOLOGY REPORT*  Clinical Data: Fall, knee pain  LEFT KNEE - COMPLETE 4+ VIEW  Comparison: Prior knee radiographs 08/31/2011  Findings: Large knee joint effusion.  Trabecular irregularity in the lateral tibial plateau concerning for impacted plateau fracture.  Additionally, there is a subtle lucency.  The bones are mildly osteopenic.  IMPRESSION: Agree with preliminary report: probable impacted lateral tibial plateau fracture with associated large suprapatellar joint effusion.  Atherosclerotic calcifications in the superficial femoral artery.  Clinically significant discrepancy from primary report, if provided: None   Original Report Authenticated By: Malachy Moan, M.D.    Imaging Review Dg Tibia/fibula Left  01/12/2013   *RADIOLOGY REPORT*  Clinical Data: Left knee pain status post fall  LEFT TIBIA AND FIBULA - 2 VIEW  Comparison: Concurrently obtained radiographs of the left knee  Findings: Irregularity of the cortical trabecular pattern in the lateral tibial plateau better demonstrated on concurrently obtained radiographs of the knee.  No evidence of acute fracture in the mid or distal tibia or fibula.  Scattered atherosclerotic calcifications are noted.  No ankle joint effusion.  Visualized bones and joints of the foot are unremarkable.  IMPRESSION: Probable impacted lateral tibial plateau fracture.  No evidence of fracture or malalignment in the  mid or distal lower leg.  Clinically significant discrepancy from primary report, if provided: None   Original Report Authenticated By: Malachy Moan, M.D.   Ct Head Wo Contrast  01/13/2013   CLINICAL DATA:  Near syncope  EXAM: CT HEAD WITHOUT CONTRAST  TECHNIQUE: Contiguous axial images were obtained from the base of the skull through the vertex without intravenous contrast.  COMPARISON:  01/07/2013  FINDINGS: Skull:No acute osseous abnormality. No lytic or blastic lesion.  Orbits: Bilateral cataract resection.  Brain: No evidence of acute abnormality, such as acute infarction, hemorrhage, hydrocephalus, or mass lesion/mass effect. Unchanged pattern of bilateral cerebral  white matter low-attenuation, confluent around the lateral ventricles. Global brain atrophy, commensurate with age. Intracranial atherosclerosis.  IMPRESSION: 1. No evidence of acute intracranial disease. 2. Senescent brain changes, age appropriate.   Electronically Signed   By: Tiburcio Pea   On: 01/13/2013 02:30   Dg Chest Port 1 View  01/13/2013   CLINICAL DATA:  Syncope.  EXAM: PORTABLE CHEST - 1 VIEW  COMPARISON:  None.  FINDINGS: Heart size and mediastinal contours are within normal limits. Both lungs are clear. Visualized skeletal structures are unremarkable.  IMPRESSION: No active disease.   Electronically Signed   By: Drusilla Kanner M.D.   On: 01/13/2013 02:13   Dg Knee Complete 4 Views Left  01/12/2013   *RADIOLOGY REPORT*  Clinical Data: Fall, knee pain  LEFT KNEE - COMPLETE 4+ VIEW  Comparison: Prior knee radiographs 08/31/2011  Findings: Large knee joint effusion.  Trabecular irregularity in the lateral tibial plateau concerning for impacted plateau fracture.  Additionally, there is a subtle lucency.  The bones are mildly osteopenic.  IMPRESSION: Agree with preliminary report: probable impacted lateral tibial plateau fracture with associated large suprapatellar joint effusion.  Atherosclerotic calcifications in the  superficial femoral artery.  Clinically significant discrepancy from primary report, if provided: None   Original Report Authenticated By: Malachy Moan, M.D.    Date: 01/13/2013  Rate: 83  Rhythm: normal sinus rhythm  QRS Axis: normal  Intervals: normal  ST/T Wave abnormalities: normal  Conduction Disutrbances:none  Narrative Interpretation: Normal ECG. When compared with ECG of Sep 08 2011, no significant changes are seen.  Old EKG Reviewed: unchanged   MDM   1. Frequent loose stools   2. GERD (gastroesophageal reflux disease)   3. TIA (transient ischemic attack)   4. Near syncope    Near syncopal episode which probably is vasovagal related to pain in her leg. However, her original fall could have been related to another TIA. Old records are reviewed and she was seen in urgent care for her TIA and was evaluated with CT and carotid ultrasound but she did not have MRI of the brain, MRA of the head, or echocardiogram. I feel she will need to be admitted to complete her TIA workup and also to make appropriate arrangements for her living situation. She has orthopedic followup is scheduled for September 25 with Dr. Luiz Blare.  ED workup is unremarkable. Case is discussed with Dr. Izola Price of [facility] who agrees to admit the patient to complete her TIA workup and evaluate for other possible causes of near syncope and falling.  Dione Booze, MD 01/13/13 [phone]  Patient informs me that laceration to her right arm was affixed but not the one on her left arm. Dressing is removed and she has a large skin tear measuring approximately 6 cm. She is concerned that it will not heal well. Therefore, decision was made to try and stabilize it with Dermabond.  LACERATION REPAIR Performed by: RUEAV,[email or web address] Authorized by: XBJYN,WGNFA Consent: Verbal consent obtained. Risks and benefits: risks, benefits and alternatives were discussed Consent given by: patient Patient identity confirmed: provided  demographic data Prepped and Draped in normal sterile fashion Wound explored  Laceration Location: left forearm  Laceration Length: 6.0 cm  No Foreign Bodies seen or palpated  Anesthesia: local infiltration  Local anesthetic:none  Amount of cleaning: standard  Skin closure: loose  Technique: Tissue adhesive  Patient tolerance: Patient tolerated the procedure well with no immediate complications.   Dione Booze, MD 01/13/13 [phone]

## 2013-01-13 NOTE — ED Notes (Signed)
Pt resting with eyes closed, respirations regular and unlabored.

## 2013-01-13 NOTE — Progress Notes (Signed)
[facility] PROGRESS NOTE  NAVIL KOLE ZOX:[id number] DOB: Sep 05, 1922 DOA: 01/12/2013 PCP: Kristian Covey, MD  Assessment/Plan: Principal Problem:   Falls Active Problems:   GERD (gastroesophageal reflux disease)   TIA (transient ischemic attack)   Tibial plateau fracture    1. Falls: Patient presented with recurrent falls, of unclear etiology. Differentials include age-related gait instability, possible TIAs, deconditioning, and dizziness. Patient has no focal neurologic deficit, and Head CT scan is devoid of acute findings. TIA work up is in progress, including telemetric monitoring, MRI brain, 2D Echocardiogram, TSH, lipid panel and HBA1C. On low dose ASA. Have added meclizine. PT/OT evaluation. 12-Lead EKG shows SR, and no acute ischemic changes.  2. Query TIA (transient ischemic attack): See above discussion.  3. Tibial plateau fracture: This has complicated fall, described in #1. Patient is currently unable to weight-bear. Managing with analgesics. Will consult Dr Luiz Blare et al.   4. GERD (gastroesophageal reflux disease): Asymptomatic on PPI.    Code Status: Full Code.  Family Communication:  Disposition Plan: To be determined.    Brief narrative: 79 year old female with history of OA, SCC RLE, s/p removal, s/p left breast lumpectomy for benign lesion decades ago, GERD, otherwise, relatively healthy and independent who lives at home alone, presenting to [facility] ED with frequent falls at home, the last occuring earlier in afternoon of 01/12/13. She explains she has had similar episode in the past about one week ago, was seen at the urgent care, told she probably had a TIA, and she was sent home. In afternoon of 01/12/13, she felt somewhat dizzy, lost her balance and fell. She is worried that she can no longer continue to care for herself at home. She denies chest pain or shortness of breath, no abdominal or urinary concerns, no specific focal neurological symptoms. X-Ray of left  Tibia/Fibular, revealed a left tibial plateau fracture. Admitted for further evaluation and management. She is scheduled to see Dr Luiz Blare, orthopedic surgeon, on 01/14/13.    Consultants:  N/A.   Procedures:  X-Ray Left Tibia/Fibula.  X-Ray Left Knee.  CXR.  Head CT Scan.    Antibiotics:  N/A.   HPI/Subjective:No new issues. Not in acute pain.   Objective: Vital signs in last 24 hours: Temp:  [98.2 F (36.8 C)-98.9 F (37.2 C)] 98.4 F (36.9 C) (09/24 0548) Pulse Rate:  [75-89] 82 (09/24 0548) Resp:  [13-23] 18 (09/24 0548) BP: (131-149)/(59-76) 131/67 mmHg (09/24 0548) SpO2:  [94 %-100 %] 94 % (09/24 0548) Weight:  [48.807 kg (107 lb 9.6 oz)] 48.807 kg (107 lb 9.6 oz) (09/24 0400) Weight change:  Last BM Date: 01/13/13  Intake/Output from previous day:       Physical Exam: General: Comfortable, alert, communicative, fully oriented, not short of breath at rest.  HEENT:  No clinical pallor, no jaundice, no conjunctival injection or discharge. Hydration status is satisfactory.  NECK:  Supple, JVP not seen, no carotid bruits, no palpable lymphadenopathy, no palpable goiter. CHEST:  Clinically clear to auscultation, no wheezes, no crackles. HEART:  Sounds 1 and 2 heard, normal, regular, no murmurs. ABDOMEN:  Flat, soft, non-tender, no palpable organomegaly, no palpable masses, normal bowel sounds. GENITALIA:  Not examined. UPPER EXTREMITIES: Bruising is noted on right arm, and right forearm is under dressings.  LOWER EXTREMITIES:  No pitting edema, palpable peripheral pulses. Dressings noted on medial aspect of left lower leg, as well as bruising. MUSCULOSKELETAL SYSTEM:  Has a moderated left knee effusion, and decreased ROM left knee, otherwise, generalized  osteoarthritic changes. CENTRAL NERVOUS SYSTEM:  No focal neurologic deficit on gross examination.  Lab Results:  Recent Labs  01/13/13 0155  WBC 10.4  HGB 12.7  HCT 35.8*  PLT 166    Recent Labs   01/13/13 0155  NA 140  K 4.5  CL 103  CO2 26  GLUCOSE 115*  BUN 21  CREATININE 0.62  CALCIUM 8.6   No results found for this or any previous visit (from the past 240 hour(s)).   Studies/Results: Dg Tibia/fibula Left  01/12/2013   *RADIOLOGY REPORT*  Clinical Data: Left knee pain status post fall  LEFT TIBIA AND FIBULA - 2 VIEW  Comparison: Concurrently obtained radiographs of the left knee  Findings: Irregularity of the cortical trabecular pattern in the lateral tibial plateau better demonstrated on concurrently obtained radiographs of the knee.  No evidence of acute fracture in the mid or distal tibia or fibula.  Scattered atherosclerotic calcifications are noted.  No ankle joint effusion.  Visualized bones and joints of the foot are unremarkable.  IMPRESSION: Probable impacted lateral tibial plateau fracture.  No evidence of fracture or malalignment in the mid or distal lower leg.  Clinically significant discrepancy from primary report, if provided: None   Original Report Authenticated By: Malachy Moan, M.D.   Ct Head Wo Contrast  01/13/2013   CLINICAL DATA:  Near syncope  EXAM: CT HEAD WITHOUT CONTRAST  TECHNIQUE: Contiguous axial images were obtained from the base of the skull through the vertex without intravenous contrast.  COMPARISON:  01/07/2013  FINDINGS: Skull:No acute osseous abnormality. No lytic or blastic lesion.  Orbits: Bilateral cataract resection.  Brain: No evidence of acute abnormality, such as acute infarction, hemorrhage, hydrocephalus, or mass lesion/mass effect. Unchanged pattern of bilateral cerebral white matter low-attenuation, confluent around the lateral ventricles. Global brain atrophy, commensurate with age. Intracranial atherosclerosis.  IMPRESSION: 1. No evidence of acute intracranial disease. 2. Senescent brain changes, age appropriate.   Electronically Signed   By: Tiburcio Pea   On: 01/13/2013 02:30   Dg Chest Port 1 View  01/13/2013   CLINICAL DATA:   Syncope.  EXAM: PORTABLE CHEST - 1 VIEW  COMPARISON:  None.  FINDINGS: Heart size and mediastinal contours are within normal limits. Both lungs are clear. Visualized skeletal structures are unremarkable.  IMPRESSION: No active disease.   Electronically Signed   By: Drusilla Kanner M.D.   On: 01/13/2013 02:13   Dg Knee Complete 4 Views Left  01/12/2013   *RADIOLOGY REPORT*  Clinical Data: Fall, knee pain  LEFT KNEE - COMPLETE 4+ VIEW  Comparison: Prior knee radiographs 08/31/2011  Findings: Large knee joint effusion.  Trabecular irregularity in the lateral tibial plateau concerning for impacted plateau fracture.  Additionally, there is a subtle lucency.  The bones are mildly osteopenic.  IMPRESSION: Agree with preliminary report: probable impacted lateral tibial plateau fracture with associated large suprapatellar joint effusion.  Atherosclerotic calcifications in the superficial femoral artery.  Clinically significant discrepancy from primary report, if provided: None   Original Report Authenticated By: Malachy Moan, M.D.    Medications: Scheduled Meds: . aspirin EC  81 mg Oral Daily  . enoxaparin (LOVENOX) injection  40 mg Subcutaneous Q24H  . ibuprofen  400 mg Oral Once  . pantoprazole  40 mg Oral Daily   Continuous Infusions: . sodium chloride 50 mL/hr at 01/13/13 0534   PRN Meds:.acetaminophen    LOS: 1 day   Zenda Herskowitz,CHRISTOPHER  [facility] Pager [phone]. If 8PM-8AM, please contact night-coverage  at [email or web address], password [facility] 01/13/2013, 7:34 AM  LOS: 1 day

## 2013-01-13 NOTE — Clinical Social Work Note (Signed)
CSW attempted to speak with pt at bedside. Pt was participating in therapy at the time. Pt is being recommended for SNF placement upon medical discharge. CSW to continue to follow and assist with discharge planning needs.  Darlyn Chamber, MSW, LCSWA Clinical Social Work [phone]

## 2013-01-13 NOTE — ED Notes (Signed)
Patient transported to CT 

## 2013-01-13 NOTE — Evaluation (Signed)
Occupational Therapy Evaluation Patient Details Name: [name] MRN: [id number] DOB: 1922/05/10 Today's Date: 01/13/2013 Time: 0865-7846 OT Time Calculation (min): 25 min  OT Assessment / Plan / Recommendation History of present illness 79yo female s/p fall at home with Left tibia fx. Pt admitted for completion of TIA work up and evaluation of frequent falls at home. Telemetry bed requested due to patient living at home. Pt has a church member as POA MRI ordered and pending results   Clinical Impression   PT admitted with LT LE TIBia Fx s/p fall with unknown resource of recent falls. Pt currently with functional limitiations due to the deficits listed below (see OT problem list).  Pt will benefit from skilled OT to increase their independence and safety with adls and balance to allow discharge SNF.     OT Assessment  Patient needs continued OT Services    Follow Up Recommendations  SNF;Supervision/Assistance - 24 hour    Barriers to Discharge      Equipment Recommendations  3 in 1 bedside comode;Wheelchair (measurements OT);Wheelchair cushion (measurements OT);Other (comment) (RW)    Recommendations for Other Services    Frequency  Min 2X/week    Precautions / Restrictions Precautions Precautions: Fall Required Braces or Orthoses: Knee Immobilizer - Left Knee Immobilizer - Left: On when out of bed or walking Restrictions Weight Bearing Restrictions: Yes LLE Weight Bearing: Touchdown weight bearing Other Position/Activity Restrictions: Dr Valentino Hue - TWB on LT LE   Pertinent Vitals/Pain Pulling on LT LE with TDWB with RW No pain reported    ADL  Eating/Feeding: Modified independent Where Assessed - Eating/Feeding: Chair Grooming: Wash/dry face;Teeth care;Modified independent Where Assessed - Grooming: Supported sitting Lower Body Dressing: Maximal assistance Where Assessed - Lower Body Dressing: Supported sit to Pharmacist, hospital: Moderate assistance (difficulty  backing up with RW and sequence with RW) Toilet Transfer Method: Sit to stand Toilet Transfer Equipment: Raised toilet seat with arms (or 3-in-1 over toilet) Equipment Used: Gait belt;Rolling walker;Knee Immobilizer Transfers/Ambulation Related to ADLs: Pt ambulating with RW and educated on TDWB on LT LE. pt verbalized 100% correct NWB taught by PT. Pt now holding LT LE in the air and incr fall risk due to unsteady with RW. pt holding LT LE in the air off the floor for turning with RW causing Rt posterior lob. Pt educated on keeping LT LE toes touching to decr LOB. Pt needs continued practice with new sequence. Pt progressing well. Pt HOH and needs redirection at times due to not hearing therapist. Pt very concerned with d/c ing back to normal routine in 2 months.  ADL Comments: Pt educated that bone requires 6 weeks to form ang 8 weeks to harden so recovery will take 2 months. Pt very concerned with prolonged stay at SNF. pt states "i just want to get back to normal" Pt verbalized feeling upset about missing church event and not playing bridge with friends on Monday. Pt educated on dressing Lt LE first for LB and dressing Rt UE first for UB. Pt able to recall information using teach back. Pt will need AE for LB dressing at this time. Next sesion to educate patient on AE use    OT Diagnosis: Generalized weakness;Acute pain  OT Problem List: Decreased strength;Decreased activity tolerance;Impaired balance (sitting and/or standing);Decreased safety awareness;Decreased knowledge of use of DME or AE;Decreased knowledge of precautions;Pain OT Treatment Interventions: Self-care/ADL training;Therapeutic exercise;DME and/or AE instruction;Therapeutic activities;Patient/family education;Balance training   OT Goals(Current goals can be found in the care  plan section) Acute Rehab OT Goals Patient Stated Goal: to go to a short term SNF OT Goal Formulation: With patient Time For Goal Achievement:  01/27/13 Potential to Achieve Goals: Good ADL Goals Pt Will Perform Lower Body Dressing: with min guard assist;with adaptive equipment;sit to/from stand Pt Will Transfer to Toilet: with supervision;bedside commode  Visit Information  Last OT Received On: 01/13/13 Assistance Needed: +1 History of Present Illness: 79yo female s/p fall at home with Left tibia fx. Pt admitted for completion of TIA work up and evaluation of frequent falls at home. Telemetry bed requested due to patient living at home. Pt has a church member as POA MRI ordered and pending results       Prior Functioning     Home Living Family/patient expects to be discharged to:: Skilled nursing facility Living Arrangements: Alone Available Help at Discharge: Family;Available PRN/intermittently Type of Home: House Home Access: Stairs to enter [employer] of Steps: 3 Entrance Stairs-Rails: Can reach both Home Layout: One level Home Equipment: Cane - single point Additional Comments: 2 SPCs but pt reports she did not use them, and pt reports a friend is bringing her walker, unclear if RW, rollator or standard walker. Prior Function Level of Independence: Independent Communication Communication: No difficulties Dominant Hand: Right         Vision/Perception Vision - History Baseline Vision: No visual deficits Patient Visual Report: No change from baseline   Cognition  Cognition Arousal/Alertness: Awake/alert Behavior During Therapy: WFL for tasks assessed/performed Overall Cognitive Status: Within Functional Limits for tasks assessed    Extremity/Trunk Assessment Upper Extremity Assessment Upper Extremity Assessment: Overall WFL for tasks assessed Lower Extremity Assessment Lower Extremity Assessment: Defer to PT evaluation Cervical / Trunk Assessment Cervical / Trunk Assessment: Normal     Mobility Bed Mobility Bed Mobility: Not assessed Transfers Sit to Stand: 4: Min guard;With upper  extremity assist;From bed Stand to Sit: 4: Min guard;With upper extremity assist;To chair/3-in-1 Details for Transfer Assistance: cues for hand placement and safety with RW. Pt needs cues for keeping LT LE TDWB in decr LOB to right posteriorly     Exercise     Balance     End of Session OT - End of Session Activity Tolerance: Patient tolerated treatment well Patient left: in chair;with call bell/phone within reach Nurse Communication: Mobility status;Precautions  GO     Harolyn Rutherford 01/13/2013, 2:32 PM Pager: [phone]

## 2013-01-13 NOTE — Evaluation (Signed)
Physical Therapy Evaluation Patient Details Name: [name] MRN: [id number] DOB: [birth year] Today's Date: 01/13/2013 Time: 4098-1191 PT Time Calculation (min): 28 min  PT Assessment / Plan / Recommendation History of Present Illness  79 year old female relatively healthy and independent who lives at home alone, presents to [facility] ED with main concern of frequent falls at home and the last occuring earlier this afternoon prior to this admission. She explains she has had similar episode in the past about one week ago and was seen at the urgent care and told she did not have a stroke and she was sent home. In addition, she was told she had left fracture of the leg (tibial fx) and was scheduled an appointment with Dr. Luiz Blare Sept 25th, 2014. This afternoon, she explains she felt somewhat dizzy and lost her balance and fell. She is worried that she can no longer continue to care for herself at home. She denies chest pain or shortness of breath, no abdominal or urinary concerns, no specific focal neurological symptoms.  [facility] asked to admit for completion of TIA work up and evaluation of frequent falls at home. Telemetry bed requested.   Clinical Impression  Pt admitted for falling and L tibia plateau fracture. Pt currently with functional limitations due to the deficits listed below (see PT Problem List). Pt able to amb. 10' with RW and min A today. Pt reports she is concerned with going home as she lives at home and does not feel that she is able to care for herself. PT discussed pt going SNF to improve mobility and safety, pt seemed agreeable.  Pt will benefit from skilled PT to increase their independence and safety with mobility to allow discharge.    PT Assessment  Patient needs continued PT services    Follow Up Recommendations  SNF    Does the patient have the potential to tolerate intense rehabilitation      Barriers to Discharge Decreased caregiver support      Equipment  Recommendations  Rolling walker with 5" wheels    Recommendations for Other Services     Frequency Min 3X/week    Precautions / Restrictions Precautions Precautions: Fall Required Braces or Orthoses: Knee Immobilizer - Left Knee Immobilizer - Left: On when out of bed or walking Restrictions Weight Bearing Restrictions: Yes LLE Weight Bearing: Touchdown weight bearing Other Position/Activity Restrictions: Pt reports Urgent Care MD told her to wear knee immobilizer on LLE when OOB and amb. Pt unclear if MD told her if LLE NWB, will follow-up with MD to clarify.  After session ortho PA stated TDWB.   Pertinent Vitals/Pain Pt reports 9/10 LLE pain at rest, with no increase during mobility. Pt positioned to comfort in chair at end of session. Pt assessed for orthostatic BP as pt can not recall mechanism of fall. BP in supine: 139/75  BP in sitting: 138/70   BP in standing: 144/72  . All with no c/o of dizziness.      Mobility  Bed Mobility Bed Mobility: Supine to Sit;Sitting - Scoot to Edge of Bed Supine to Sit: 5: Supervision;With rails Sitting - Scoot to Edge of Bed: 5: Supervision Details for Bed Mobility Assistance: Supervision for safety and VC's to keep LLE straight with knee immobilzer donned in long sitting.  Transfers Transfers: Sit to Stand Sit to Stand: 4: Min assist;From bed;With upper extremity assist Stand to Sit: 4: Min guard;With upper extremity assist;To chair/3-in-1 Details for Transfer Assistance: Min assist for sit to stand for  safety and to keep RW in place, min guard during stand to sit to chair for safety. VC's to keep L knee extended and for hand placement. Ambulation/Gait Ambulation/Gait Assistance: 4: Min assist Ambulation Distance (Feet): 10 Feet Assistive device: Rolling walker Ambulation/Gait Assistance Details: Min assist to keep RW in place. VC's for gait sequence with RW, as pt required to hop on RLE and push down through UEs to not bear wt on LLE. Pt  reports LLE pain 9/10 at rest with no increase in pain during amb. Gait velocity: Decreased General Gait Details: Did well with NWB during amb. Stairs: No Modified Rankin (Stroke Patients Only) Pre-Morbid Rankin Score: No symptoms Modified Rankin: Moderately severe disability    Exercises     PT Diagnosis: Difficulty walking;Acute pain  PT Problem List: Decreased strength;Decreased activity tolerance;Decreased balance;Decreased mobility;Decreased knowledge of use of DME;Decreased knowledge of precautions;Pain PT Treatment Interventions: DME instruction;Gait training;Functional mobility training;Therapeutic activities;Therapeutic exercise;Balance training;Neuromuscular re-education;Patient/family education     PT Goals(Current goals can be found in the care plan section) Acute Rehab PT Goals Patient Stated Goal: to go to a short term SNF PT Goal Formulation: With patient Time For Goal Achievement: 01/27/13 Potential to Achieve Goals: Good  Visit Information  Last PT Received On: 01/13/13 Assistance Needed: +1 History of Present Illness: 79 year old female relatively healthy and independent who lives at home alone, presents to [facility] ED with main concern of frequent falls at home and the last occuring earlier this afternoon prior to this admission. She explains she has had similar episode in the past about one week ago and was seen at the urgent care and told she did not have a stroke and she was sent home. In addition, she was told she had left fracture of the leg (tibial fx) and was scheduled an appointment with Dr. Luiz Blare Sept 25th, 2014. This afternoon, she explains she felt somewhat dizzy and lost her balance and fell. She is worried that she can no longer continue to care for herself at home. She denies chest pain or shortness of breath, no abdominal or urinary concerns, no specific focal neurological symptoms.  TRH asked to admit for completion of TIA work up and evaluation of frequent  falls at home. Telemetry bed requested.        Prior Functioning  Home Living Family/patient expects to be discharged to:: Private residence Living Arrangements: Alone Available Help at Discharge: Family;Available PRN/intermittently Type of Home: House Home Access: Stairs to enter [employer] of Steps: 3 Entrance Stairs-Rails: Can reach both Home Layout: One level Home Equipment: Cane - single point Additional Comments: 2 SPCs but pt reports she did not use them, and pt reports a friend is bringing her walker, unclear if RW, rollator or standard walker. Prior Function Level of Independence: Independent Communication Communication: No difficulties    Cognition  Cognition Arousal/Alertness: Awake/alert Behavior During Therapy: WFL for tasks assessed/performed Overall Cognitive Status: Within Functional Limits for tasks assessed Memory: Decreased recall of precautions    Extremity/Trunk Assessment Lower Extremity Assessment Lower Extremity Assessment: LLE deficits/detail (RLE WFL for tasks assessed.) LLE Deficits / Details: Unable to test strength due to L tibia plateau fracture. No c/o of numbness/tingling. LLE: Unable to fully assess due to pain   Balance    End of Session PT - End of Session Equipment Utilized During Treatment: Gait belt;Left knee immobilizer Activity Tolerance: Patient limited by pain;Patient limited by fatigue Nurse Communication: Mobility status;Weight bearing status  GP     Erick Alley  Larita Fife 01/13/2013, 12:49 PM

## 2013-01-14 DIAGNOSIS — W19XXXA Unspecified fall, initial encounter: Secondary | ICD-10-CM

## 2013-01-14 DIAGNOSIS — S82109A Unspecified fracture of upper end of unspecified tibia, initial encounter for closed fracture: Principal | ICD-10-CM

## 2013-01-14 DIAGNOSIS — G459 Transient cerebral ischemic attack, unspecified: Secondary | ICD-10-CM

## 2013-01-14 LAB — CBC
Hemoglobin: 12 g/dL (ref 12.0–15.0)
MCH: 30.4 pg (ref 26.0–34.0)
MCHC: 33.8 g/dL (ref 30.0–36.0)
MCV: 89.9 fL (ref 78.0–100.0)
RBC: 3.95 MIL/uL (ref 3.87–5.11)
RDW: 14.3 % (ref 11.5–15.5)

## 2013-01-14 LAB — BASIC METABOLIC PANEL
BUN: 17 mg/dL (ref 6–23)
CO2: 29 mEq/L (ref 19–32)
Calcium: 8.7 mg/dL (ref 8.4–10.5)
Chloride: 103 mEq/L (ref 96–112)
Creatinine, Ser: 0.73 mg/dL (ref 0.50–1.10)
GFR calc non Af Amer: 74 mL/min — ABNORMAL LOW (ref >90)
Glucose, Bld: 87 mg/dL (ref 70–99)
Sodium: 138 mEq/L (ref 135–145)

## 2013-01-14 LAB — GLUCOSE, CAPILLARY
Glucose-Capillary: 85 mg/dL (ref 70–99)
Glucose-Capillary: 80 mg/dL (ref 70–99)

## 2013-01-14 MED ORDER — CALCIUM CARBONATE-VITAMIN D 500-200 MG-UNIT PO TABS
1.0000 | ORAL_TABLET | Freq: Every day | ORAL | Status: DC
  Administered 2013-01-14 – 2013-01-18 (×5): 1 via ORAL
  Filled 2013-01-14 (×5): qty 1

## 2013-01-14 MED ORDER — IBUPROFEN 400 MG PO TABS
400.0000 mg | ORAL_TABLET | Freq: Three times a day (TID) | ORAL | Status: DC | PRN
  Administered 2013-01-16 – 2013-01-18 (×7): 400 mg via ORAL
  Filled 2013-01-14 (×9): qty 1

## 2013-01-14 MED ORDER — OCUVITE PO TABS
1.0000 | ORAL_TABLET | Freq: Every day | ORAL | Status: DC
  Administered 2013-01-14 – 2013-01-18 (×3): 1 via ORAL
  Filled 2013-01-14 (×5): qty 1

## 2013-01-14 MED ORDER — VITAMIN C 250 MG PO TABS
125.0000 mg | ORAL_TABLET | Freq: Every day | ORAL | Status: DC
  Administered 2013-01-15 – 2013-01-18 (×4): 125 mg via ORAL
  Filled 2013-01-14 (×5): qty 1

## 2013-01-14 MED ORDER — ADULT MULTIVITAMIN W/MINERALS CH
1.0000 | ORAL_TABLET | Freq: Every day | ORAL | Status: DC
  Administered 2013-01-14 – 2013-01-18 (×5): 1 via ORAL
  Filled 2013-01-14 (×5): qty 1

## 2013-01-14 NOTE — Progress Notes (Signed)
I have reviewed this note and agree with all findings. Kati Rickelle Sylvestre, PT, DPT Pager: [phone]   

## 2013-01-14 NOTE — Clinical Social Work Placement (Addendum)
Clinical Social Work Department CLINICAL SOCIAL WORK PLACEMENT NOTE 01/14/2013  Patient:  [name], [name]  Account Number:  [id number] Admit date:  01/12/2013  Clinical Social Worker:  Irving Burton [place], LCSWA  Date/time:  01/14/2013 03:27 PM  Clinical Social Work is seeking post-discharge placement for this patient at the following level of care:   SKILLED NURSING   (*CSW will update this form in Epic as items are completed)   01/14/2013  Patient/family provided with [facility] Department of Clinical Social Work's list of facilities offering this level of care within the geographic area requested by the patient (or if unable, by the patient's family).  01/14/2013  Patient/family informed of their freedom to choose among providers that offer the needed level of care, that participate in Medicare, Medicaid or managed care program needed by the patient, have an available bed and are willing to accept the patient.  01/14/2013  Patient/family informed of [facility]' ownership interest in [facility], as well as of the fact that they are under no obligation to receive care at this facility.  PASARR submitted to EDS on 01/15/13 PASARR number received from [employer] on 01/15/13 - [id number] [id number]  FL2 transmitted to all facilities in geographic area requested by pt/family on  01/14/2013 FL2 transmitted to all facilities within larger geographic area on   Patient informed that his/her managed care company has contracts with or will negotiate with  certain facilities, including the following:     Patient/family informed of bed offers received:   Patient chooses bed at [facility] Physician recommends and patient chooses bed at    Patient to be transferred to [facility] on 01/18/13  Patient to be transferred to facility by ambulance  The following physician request were entered in Epic:   Additional Comments:  Darlyn Chamber, MSW, LCSWA Clinical Social  Work [phone]

## 2013-01-14 NOTE — Progress Notes (Signed)
[facility] PROGRESS NOTE  [name] UJW:[id number] DOB: [birth year] DOA: 01/12/2013 PCP: Kristian Covey, MD  Assessment/Plan: Principal Problem:   Falls Active Problems:   GERD (gastroesophageal reflux disease)   TIA (transient ischemic attack)   Tibial plateau fracture    1. Falls: Patient presented with recurrent falls, of unclear etiology. Differentials included age-related gait instability, possible TIAs, deconditioning, and dizziness. She had no focal neurologic deficit, and Head CT scan was devoid of acute findings. 12-Lead EKG shows SR, and no acute ischemic changes. Lipid profile showed TC 147, TG 48, HDL 107. LDL 30. TSH is normal at 1.844. No arrhythmias were documented on telemetric monitoring telemetric monitoring, brain MRI showed no acute intracranial abnormality and MRA was normal for age. 2D Echocardiogram showed normal LV cavity size, normal wall thickness and EF of 55% to 60%. Subcostal and apical views suggest RA mass, not well delineated. No PFO. Have consulted [place] cardiology, for TEE. Meanwhile, on low dose ASA, as well as Meclizine. PT/OT evaluation.  2. Query TIA (transient ischemic attack): See above discussion.  3. Tibial plateau fracture: This has complicated fall, described in #1. Patient is currently unable to weight-bear. Managing with analgesics. Dr Luiz Blare provided orthopedic consultation, and recommended conservative management with touch-down weight-bearing and immobilization.   4. GERD (gastroesophageal reflux disease): Asymptomatic on PPI.    Code Status: Full Code.  Family Communication:  Disposition Plan: To be determined.    Brief narrative: 79 year old female with history of OA, SCC RLE, s/p removal, s/p left breast lumpectomy for benign lesion decades ago, GERD, otherwise, relatively healthy and independent who lives at home alone, presenting to [facility] ED with frequent falls at home, the last occuring earlier in afternoon of 01/12/13.  She explains she has had similar episode in the past about one week ago, was seen at the urgent care, told she probably had a TIA, and she was sent home. In afternoon of 01/12/13, she felt somewhat dizzy, lost her balance and fell. She is worried that she can no longer continue to care for herself at home. She denies chest pain or shortness of breath, no abdominal or urinary concerns, no specific focal neurological symptoms. X-Ray of left Tibia/Fibular, revealed a left tibial plateau fracture. Admitted for further evaluation and management. She is scheduled to see Dr Luiz Blare, orthopedic surgeon, on 01/14/13.    Consultants:  N/A.   Procedures:  X-Ray Left Tibia/Fibula.  X-Ray Left Knee.  CXR.  Head CT Scan.    Antibiotics:  N/A.   HPI/Subjective: No new issues.   Objective: Vital signs in last 24 hours: Temp:  [97.7 F (36.5 C)-98.5 F (36.9 C)] 98.2 F (36.8 C) (09/25 0940) Pulse Rate:  [81-106] 85 (09/25 0940) Resp:  [18] 18 (09/25 0940) BP: (116-142)/(48-90) 116/48 mmHg (09/25 0940) SpO2:  [93 %-97 %] 93 % (09/25 0940) Weight change:  Last BM Date: 01/13/13  Intake/Output from previous day:       Physical Exam: General: Comfortable, alert, communicative, fully oriented, not short of breath at rest.  HEENT:  No clinical pallor, no jaundice, no conjunctival injection or discharge. Hydration status is satisfactory.  NECK:  Supple, JVP not seen, no carotid bruits, no palpable lymphadenopathy, no palpable goiter. CHEST:  Clinically clear to auscultation, no wheezes, no crackles. HEART:  Sounds 1 and 2 heard, normal, regular, no murmurs. ABDOMEN:  Flat, soft, non-tender, no palpable organomegaly, no palpable masses, normal bowel sounds. GENITALIA:  Not examined. UPPER EXTREMITIES: Bruising is noted on right arm,  and right forearm is under dressings.  LOWER EXTREMITIES:  No pitting edema, palpable peripheral pulses. Dressings noted on medial aspect of left lower leg, as  well as bruising. Left knee in in immobilizer.  MUSCULOSKELETAL SYSTEM:  Has a moderated left knee effusion, and decreased ROM left knee, otherwise, generalized osteoarthritic changes. CENTRAL NERVOUS SYSTEM:  No focal neurologic deficit on gross examination.  Lab Results:  Recent Labs  01/13/13 1130 01/14/13 0630  WBC 9.0 7.6  HGB 12.3 12.0  HCT 36.1 35.5*  PLT 171 157    Recent Labs  01/13/13 1130 01/14/13 0630  NA 135 138  K 4.4 4.2  CL 100 103  CO2 25 29  GLUCOSE 100* 87  BUN 18 17  CREATININE 0.62 0.73  CALCIUM 8.6 8.7   No results found for this or any previous visit (from the past 240 hour(s)).   Studies/Results: Dg Tibia/fibula Left  01/12/2013   *RADIOLOGY REPORT*  Clinical Data: Left knee pain status post fall  LEFT TIBIA AND FIBULA - 2 VIEW  Comparison: Concurrently obtained radiographs of the left knee  Findings: Irregularity of the cortical trabecular pattern in the lateral tibial plateau better demonstrated on concurrently obtained radiographs of the knee.  No evidence of acute fracture in the mid or distal tibia or fibula.  Scattered atherosclerotic calcifications are noted.  No ankle joint effusion.  Visualized bones and joints of the foot are unremarkable.  IMPRESSION: Probable impacted lateral tibial plateau fracture.  No evidence of fracture or malalignment in the mid or distal lower leg.  Clinically significant discrepancy from primary report, if provided: None   Original Report Authenticated By: Malachy Moan, M.D.   Ct Head Wo Contrast  01/13/2013   CLINICAL DATA:  Near syncope  EXAM: CT HEAD WITHOUT CONTRAST  TECHNIQUE: Contiguous axial images were obtained from the base of the skull through the vertex without intravenous contrast.  COMPARISON:  01/07/2013  FINDINGS: Skull:No acute osseous abnormality. No lytic or blastic lesion.  Orbits: Bilateral cataract resection.  Brain: No evidence of acute abnormality, such as acute infarction, hemorrhage,  hydrocephalus, or mass lesion/mass effect. Unchanged pattern of bilateral cerebral white matter low-attenuation, confluent around the lateral ventricles. Global brain atrophy, commensurate with age. Intracranial atherosclerosis.  IMPRESSION: 1. No evidence of acute intracranial disease. 2. Senescent brain changes, age appropriate.   Electronically Signed   By: Tiburcio Pea   On: 01/13/2013 02:30   Mri Brain Without Contrast  01/13/2013   CLINICAL DATA:  TIA. Frequent falls recently. Dizziness.  EXAM: MRI HEAD WITHOUT CONTRAST  MRA HEAD WITHOUT CONTRAST  TECHNIQUE: Multiplanar, multiecho pulse sequences of the brain and surrounding structures were obtained without intravenous contrast. Angiographic images of the head were obtained using MRA technique without contrast.  COMPARISON:  Did CT head without contrast 01/13/2013.  FINDINGS: MRI HEAD FINDINGS  Midline intracranial structures are within normal limits for age. Moderate degenerative changes are evident at multiple levels in the upper cervical spine, including C1-2. Diffusion-weighted images demonstrate no evidence for acute or subacute infarction. Mild generalized atrophy and periventricular white matter changes are evident bilaterally. No hemorrhage or mass lesion is present. With Flow is present in the major intracranial arteries. The patient is status post bilateral lens extractions. The paranasal sinuses and mastoid air cells are clear.  MRA HEAD FINDINGS  The internal carotid arteries are within normal limits from the high cervical segments through the ICA termini bilaterally. The A1 and M1 segments are normal. The ACA and MCA  branch vessels are within normal limits for age. No significant proximal stenosis, aneurysm, or branch vessel occlusion is present.  The left vertebral artery is slightly dominant to the right. The left PICA origin is visualized and within normal limits. The right AICA is dominant. The basilar artery is within normal limits.  Both posterior cerebral arteries originate from the basilar tip. The PCA branch vessels are within normal limits bilaterally.  IMPRESSION: MRI HEAD IMPRESSION  1. Mild generalized atrophy and white matter disease is likely within normal limits for age. 2. No acute intracranial abnormality.  MRA HEAD IMPRESSION  1. Normal MRA of the brain for age. 2. No evidence for significant proximal stenosis, aneurysm, or branch vessel occlusion.   Electronically Signed   By: Gennette Pac   On: 01/13/2013 17:04   Dg Chest Port 1 View  01/13/2013   CLINICAL DATA:  Syncope.  EXAM: PORTABLE CHEST - 1 VIEW  COMPARISON:  None.  FINDINGS: Heart size and mediastinal contours are within normal limits. Both lungs are clear. Visualized skeletal structures are unremarkable.  IMPRESSION: No active disease.   Electronically Signed   By: Drusilla Kanner M.D.   On: 01/13/2013 02:13   Dg Knee Complete 4 Views Left  01/12/2013   *RADIOLOGY REPORT*  Clinical Data: Fall, knee pain  LEFT KNEE - COMPLETE 4+ VIEW  Comparison: Prior knee radiographs 08/31/2011  Findings: Large knee joint effusion.  Trabecular irregularity in the lateral tibial plateau concerning for impacted plateau fracture.  Additionally, there is a subtle lucency.  The bones are mildly osteopenic.  IMPRESSION: Agree with preliminary report: probable impacted lateral tibial plateau fracture with associated large suprapatellar joint effusion.  Atherosclerotic calcifications in the superficial femoral artery.  Clinically significant discrepancy from primary report, if provided: None   Original Report Authenticated By: Malachy Moan, M.D.   Mr Mra Head/brain Wo Cm  01/13/2013   CLINICAL DATA:  TIA. Frequent falls recently. Dizziness.  EXAM: MRI HEAD WITHOUT CONTRAST  MRA HEAD WITHOUT CONTRAST  TECHNIQUE: Multiplanar, multiecho pulse sequences of the brain and surrounding structures were obtained without intravenous contrast. Angiographic images of the head were obtained  using MRA technique without contrast.  COMPARISON:  Did CT head without contrast 01/13/2013.  FINDINGS: MRI HEAD FINDINGS  Midline intracranial structures are within normal limits for age. Moderate degenerative changes are evident at multiple levels in the upper cervical spine, including C1-2. Diffusion-weighted images demonstrate no evidence for acute or subacute infarction. Mild generalized atrophy and periventricular white matter changes are evident bilaterally. No hemorrhage or mass lesion is present. With Flow is present in the major intracranial arteries. The patient is status post bilateral lens extractions. The paranasal sinuses and mastoid air cells are clear.  MRA HEAD FINDINGS  The internal carotid arteries are within normal limits from the high cervical segments through the ICA termini bilaterally. The A1 and M1 segments are normal. The ACA and MCA branch vessels are within normal limits for age. No significant proximal stenosis, aneurysm, or branch vessel occlusion is present.  The left vertebral artery is slightly dominant to the right. The left PICA origin is visualized and within normal limits. The right AICA is dominant. The basilar artery is within normal limits. Both posterior cerebral arteries originate from the basilar tip. The PCA branch vessels are within normal limits bilaterally.  IMPRESSION: MRI HEAD IMPRESSION  1. Mild generalized atrophy and white matter disease is likely within normal limits for age. 2. No acute intracranial abnormality.  MRA HEAD  IMPRESSION  1. Normal MRA of the brain for age. 2. No evidence for significant proximal stenosis, aneurysm, or branch vessel occlusion.   Electronically Signed   By: Gennette Pac   On: 01/13/2013 17:04    Medications: Scheduled Meds: . aspirin EC  325 mg Oral Daily  . beta carotene w/minerals  1 tablet Oral Daily  . calcium-vitamin D  1 tablet Oral Daily  . enoxaparin (LOVENOX) injection  40 mg Subcutaneous Q24H  . loperamide  2 mg  Oral TID  . meclizine  12.5 mg Oral TID  . multivitamin with minerals  1 tablet Oral Daily  . pantoprazole  40 mg Oral Daily  . vitamin C  125 mg Oral Daily   Continuous Infusions:   PRN Meds:.acetaminophen    LOS: 2 days   Ticara Waner,CHRISTOPHER  [facility] Pager [phone]. If 8PM-8AM, please contact night-coverage at [email or web address], password [facility] 01/14/2013, 1:56 PM  LOS: 2 days

## 2013-01-14 NOTE — Clinical Social Work Psychosocial (Signed)
Clinical Social Work Department BRIEF PSYCHOSOCIAL ASSESSMENT 01/14/2013  Patient:  [name], [name]     Account Number:  [id number]     Admit date:  01/12/2013  Clinical Social Worker:  Sherre Lain  Date/Time:  01/14/2013 02:23 PM  Referred by:  Physician  Date Referred:  01/14/2013 Referred for  SNF Placement   Other Referral:   none.   Interview type:  Patient Other interview type:   none.    PSYCHOSOCIAL DATA Living Status:  ALONE Admitted from facility:   Level of care:   Primary support name:  Raynelle Fanning Primary support relationship to patient:  FRIEND Degree of support available:   Strong. Raynelle Fanning is pt's HPOA. [phone], [phone]).    CURRENT CONCERNS Current Concerns  Post-Acute Placement   Other Concerns:   none.    SOCIAL WORK ASSESSMENT / PLAN CSW met with pt at bedside. Pt stated that she would like for CSW to speak with her HPOA regarding discharge planning needs. Pt stated that she is concerned she won't be able to be "social" once she goes home with a walker. CSW provided support to pt. Pt stated that she prefers to be placed at [facility]. CSW to continue to assist with discharge planning needs.   Assessment/plan status:  Psychosocial Support/Ongoing Assessment of Needs Other assessment/ plan:   none.   Information/referral to community resources:   [facility] placement.    PATIENTS/FAMILYS RESPONSE TO PLAN OF CARE: Pt was understanding and agreeable to CSW plan of care.    Darlyn Chamber, MSW, LCSWA Clinical Social Work [phone]

## 2013-01-14 NOTE — Progress Notes (Signed)
Physical Therapy Treatment Patient Details Name: [name] MRN: [id number] DOB: 31-Oct-1922 Today's Date: 01/14/2013 Time: 1478-2956 PT Time Calculation (min): 38 min  PT Assessment / Plan / Recommendation  History of Present Illness 79yo female s/p fall at home with Left tibia fx. Pt admitted for completion of TIA work up and evaluation of frequent falls at home. Telemetry bed requested due to patient living at home. Pt has a church member as Chartered certified accountant. MRI/MRA negative for acute findings.   PT Comments   Pt demonstrating progress as she was able to ambulate 20' with RW and min A-min guard, while adhering to LLE TDWB status. Pt performed LE strengthening exercises in supine with AAROM during SLR to decr. LLE pain. Pt states she'd like to go home but feels that she needs to go to rehab first in order to get stronger and improve mobility. Pt would continue to benefit from PT in order to improve safety and improve functional mobility.  Follow Up Recommendations  SNF     Does the patient have the potential to tolerate intense rehabilitation     Barriers to Discharge        Equipment Recommendations  Rolling walker with 5" wheels    Recommendations for Other Services    Frequency Min 3X/week   Progress towards PT Goals Progress towards PT goals: Progressing toward goals  Plan Current plan remains appropriate    Precautions / Restrictions Precautions Precautions: Fall Required Braces or Orthoses: Knee Immobilizer - Left Knee Immobilizer - Left: On when out of bed or walking Restrictions Weight Bearing Restrictions: Yes LLE Weight Bearing: Touchdown weight bearing   Pertinent Vitals/Pain Pt reports 0/10 pain at rest, with increase to 6/10 during amb. PT encouraged pt to adhere to TDWB on LLE during amb, to decr. LLE pain.  Pt repositioned to comfort end of session and reported pain was 0/10.    Mobility  Bed Mobility Bed Mobility: Supine to Sit;Sit to Supine;Sitting - Scoot to Edge  of Bed Supine to Sit: 5: Supervision;With rails Sitting - Scoot to Edge of Bed: 5: Supervision Sit to Supine: 5: Supervision;With rail Details for Bed Mobility Assistance: Supervision for safety during bed mobility x2 trials. Pt c/o of LLE feeling "heavy" and constricted, PT decreased tightness of knee immobilizer and pt reports LLE feels better. PT educated pt that knee immobilizer should feel snug but not cause LLE to feel constricted/throbbing. Transfers Transfers: Sit to Stand;Stand to Sit Sit to Stand: 4: Min guard;With upper extremity assist;From bed Stand to Sit: 4: Min guard;With upper extremity assist;To chair/3-in-1;To bed;With armrests Details for Transfer Assistance: Min guard for safety and to make sure pt adheres to TBWB status on LLE.  VC's for hand placement and to keep LLE extended. Performed sit<>stand multiple times in order to adjust knee immobilizer and for instructions. Ambulation/Gait Ambulation/Gait Assistance: 4: Min assist Ambulation Distance (Feet): 20 Feet Assistive device: Rolling walker Ambulation/Gait Assistance Details: Min assist to move RW while pt practiced TDWB on LLE, progressed to min guard for safety as pt became more comfortable amb. with RW. Pt required several standing rest breaks due to fatigue and incr. in LLE pain (6/10) during amb, pt emphasized the importance of adhering to TDWB status in order to decr. pain. Gait velocity: Decreased. General Gait Details: Pt did well with TDWB. PT educated pt to stay within RW but not to let LE's pass the front wheels of RW to maintain stability. Stairs: No    Exercises General Exercises -  Lower Extremity Ankle Circles/Pumps: AROM;20 reps;Both;Supine Gluteal Sets: AROM;Both;20 reps;Supine Heel Slides: AROM;Right;20 reps;Supine Hip ABduction/ADduction: AROM;Both;20 reps;Supine Straight Leg Raises: AROM;AAROM;Both;20 reps;Supine (AAROM LLE )   PT Diagnosis:    PT Problem List:   PT Treatment Interventions:      PT Goals (current goals can now be found in the care plan section) Acute Rehab PT Goals Patient Stated Goal: I want to go home but I need rehab first. PT Goal Formulation: With patient Time For Goal Achievement: 01/27/13 Potential to Achieve Goals: Good  Visit Information  Last PT Received On: 01/14/13 Assistance Needed: +1 History of Present Illness: 79yo female s/p fall at home with Left tibia fx. Pt admitted for completion of TIA work up and evaluation of frequent falls at home. Telemetry bed requested due to patient living at home. Pt has a church member as Chartered certified accountant. MRI/MRA negative for acute findings.    Subjective Data  Patient Stated Goal: I want to go home but I need rehab first.   Cognition  Cognition Arousal/Alertness: Awake/alert Behavior During Therapy: WFL for tasks assessed/performed Overall Cognitive Status: Within Functional Limits for tasks assessed    Balance     End of Session PT - End of Session Equipment Utilized During Treatment: Gait belt;Left knee immobilizer Activity Tolerance: Patient limited by pain;Patient limited by fatigue Patient left: in chair;with call bell/phone within reach   GP     Sol Blazing 01/14/2013, 12:54 PM

## 2013-01-14 NOTE — Progress Notes (Signed)
Subjective:     Patient reports pain as 2 on 0-10 scale.   She wants to know how the knee immobilizer should fit on her leg.  She is interested in [facility].  Objective: Vital signs in last 24 hours: Temp:  [97.7 F (36.5 C)-98.5 F (36.9 C)] 98.2 F (36.8 C) (09/25 0940) Pulse Rate:  [81-106] 85 (09/25 0940) Resp:  [18] 18 (09/25 0940) BP: (116-142)/(48-90) 116/48 mmHg (09/25 0940) SpO2:  [93 %-97 %] 93 % (09/25 0940)  Intake/Output from previous day:   Intake/Output this shift:     Recent Labs  01/13/13 0155 01/13/13 1130 01/14/13 0630  HGB 12.7 12.3 12.0    Recent Labs  01/13/13 1130 01/14/13 0630  WBC 9.0 7.6  RBC 4.09 3.95  HCT 36.1 35.5*  PLT 171 157    Recent Labs  01/13/13 1130 01/14/13 0630  NA 135 138  K 4.4 4.2  CL 100 103  CO2 25 29  BUN 18 17  CREATININE 0.62 0.73  GLUCOSE 100* 87  CALCIUM 8.6 8.7   No results found for this basename: LABPT, INR,  in the last 72 hours  Left leg exam: Has significant ecchymosis in the lower leg.  NV intact.  Knee immobilizer is a little high on her leg and it is adjusted today.  Assessment/Plan:    nondisplaced lateral tibial plateau fracture, left knee. Plan: Continue PT touchdown weightbearing on the left with knee immobilizer. Her knee immobilizer was adjusted today.  She needs to watch her skin carefully for signs of skin breakdown from the knee immobilizer. She will be touchdown weight-bearing x2 months.  She will need followup with Dr. Luiz Blare in the office in 2 weeks.  Repeat x-rays will be done at that time. The patient apparently is looking at a bed at [facility] and can be discharged at any time from an orthopedic viewpoint. We will sign off for now.  Please call if needed.  Office # [phone].   Leba Tibbitts G 01/14/2013, 12:35 PM

## 2013-01-15 DIAGNOSIS — S51809A Unspecified open wound of unspecified forearm, initial encounter: Secondary | ICD-10-CM

## 2013-01-15 LAB — BASIC METABOLIC PANEL
BUN: 17 mg/dL (ref 6–23)
CO2: 24 mEq/L (ref 19–32)
Calcium: 8.8 mg/dL (ref 8.4–10.5)
Chloride: 102 mEq/L (ref 96–112)
Creatinine, Ser: 0.67 mg/dL (ref 0.50–1.10)
Glucose, Bld: 91 mg/dL (ref 70–99)
Sodium: 136 mEq/L (ref 135–145)

## 2013-01-15 LAB — CBC
HCT: 36.2 % (ref 36.0–46.0)
Hemoglobin: 12.4 g/dL (ref 12.0–15.0)
MCH: 30.6 pg (ref 26.0–34.0)
MCV: 89.4 fL (ref 78.0–100.0)
Platelets: 185 10*3/uL (ref 150–400)
RBC: 4.05 MIL/uL (ref 3.87–5.11)
WBC: 8.6 10*3/uL (ref 4.0–10.5)

## 2013-01-15 NOTE — Progress Notes (Signed)
Chaplain's Note:  Met with patient and HCPOA Duayne Cal . I have known pt. For many years. Patient seems to be coping well with this unexpected event. She is a very independent person though she has recognized the need for some assistance in recent years. Pt and HCPOA talked about a bed being held at [facility], however the physician indicated that a endo. Might occur first of week to address a mass identified during an echocardiogram. They are concerned about when this will occur as it impacts the bed that is available at [facility]. I will pass this concern to the nurse for follow up.   Kathlyn Sacramento  Director [employer] [phone]

## 2013-01-15 NOTE — Progress Notes (Signed)
Physical Therapy Treatment Patient Details Name: [name] MRN: [id number] DOB: 08/23/1922 Today's Date: 01/15/2013 Time: 8295-6213 PT Time Calculation (min): 43 min  PT Assessment / Plan / Recommendation  History of Present Illness 79yo female admitted to [facility] on 01/12/13 s/p fall at home with Left tibial plateau fx. Pt admitted for completion of TIA work up and evaluation of frequent falls at home. MRI/MRA negative for acute stroke findings.  Ortho consulted with non-surgical management of fracture.  Pt is to be TDWB L leg and KI on at all times (per ortho consult note).      PT Comments   Pt is doing well with her mobility.  She is mildly impulsive and quick to move.  She is at very high risk of falling trying to maintain her balance and coordinate using her RW and maintaining TDWB L leg.  She continues to be appropriate and agreeable to SNF for rehab before returning home alone.    Follow Up Recommendations  SNF     Does the patient have the potential to tolerate intense rehabilitation    Yes  Barriers to Discharge   None      Equipment Recommendations  Rolling walker with 5" wheels    Recommendations for Other Services   None  Frequency Min 3X/week   Progress towards PT Goals Progress towards PT goals: Progressing toward goals  Plan Current plan remains appropriate    Precautions / Restrictions Precautions Precautions: Fall Precaution Comments: h/o falls  Required Braces or Orthoses: Knee Immobilizer - Left Knee Immobilizer - Left: On at all times ((per ortho consult note, but no order)) Restrictions LLE Weight Bearing: Touchdown weight bearing   Pertinent Vitals/Pain See vitals flow sheet.     Mobility  Bed Mobility Supine to Sit: 5: Supervision;With rails;HOB elevated Sitting - Scoot to Edge of Bed: 5: Supervision;With rail Details for Bed Mobility Assistance: supervision for safety as pt moves quickly and is clutching to railing for support.   Transfers Transfers: Sit to Stand;Stand to [employer] Transfers Sit to Stand: 4: Min assist;With upper extremity assist;With armrests;From bed;From chair/3-in-1 Stand to Sit: 4: Min assist;With upper extremity assist;With armrests;To chair/3-in-1 Details for Transfer Assistance: min assist to steady pt for balance. She moves quickly and with jerky coordination.  Pt needed max verbal cues for safety and hand placement.   Ambulation/Gait Ambulation/Gait Assistance: 4: Min assist Ambulation Distance (Feet): 55 Feet Assistive device: Rolling walker Ambulation/Gait Assistance Details: min assist to steady pt for balance, verbal cues for leg sequencing and for safety with RW use.   Gait Pattern: Step-to pattern;Antalgic (hop-to) Gait velocity: actually, faster than is safe for her.  Verbal cues to slow down.  General Gait Details: Lowered pt's walker due to her arms are longer than her torso.   Modified Rankin (Stroke Patients Only) Pre-Morbid Rankin Score: No symptoms Modified Rankin: Moderately severe disability    Exercises General Exercises - Lower Extremity Ankle Circles/Pumps: AROM;Both;20 reps;Supine Gluteal Sets: AROM;Both;10 reps;Supine Hip ABduction/ADduction: AROM;Both;10 reps;Supine Straight Leg Raises: AROM;Both;10 reps;Supine     PT Goals (current goals can now be found in the care plan section) Acute Rehab PT Goals Patient Stated Goal: SNF to get better so she can go home.    Visit Information  Last PT Received On: 01/15/13 Assistance Needed: +1 History of Present Illness: 78yo female admitted to [facility] on 01/12/13 s/p fall at home with Left tibial plateau fx. Pt admitted for completion of TIA work up and evaluation of frequent  falls at home. MRI/MRA negative for acute stroke findings.  Ortho consulted with non-surgical management of fracture.  Pt is to be TDWB L leg and KI on at all times (per ortho consult note).       Subjective Data  Subjective: Pt reports that she  would not be able to manage with all of this at home, so she is going to go to rehab.   Patient Stated Goal: SNF to get better so she can go home.     Cognition  Cognition Arousal/Alertness: Awake/alert Behavior During Therapy: WFL for tasks assessed/performed Overall Cognitive Status: Within Functional Limits for tasks assessed    Balance  Balance Balance Assessed: Yes Static Sitting Balance Static Sitting - Balance Support: No upper extremity supported;Feet supported Static Sitting - Level of Assistance: 5: Stand by assistance Static Standing Balance Static Standing - Balance Support: Right upper extremity supported;Left upper extremity supported;Bilateral upper extremity supported Static Standing - Level of Assistance: 4: Min assist Dynamic Standing Balance Dynamic Standing - Balance Support: Right upper extremity supported;Left upper extremity supported;Bilateral upper extremity supported Dynamic Standing - Level of Assistance: 4: Min assist Dynamic Standing - Comments: min assist and verbal cues for best way to stabilize while standing at sink to wash face, hands, brush teeth. Cues for safety, strategies to make the activites safer (i.e. sit down on BSC with lid down).    End of Session PT - End of Session Equipment Utilized During Treatment: Left knee immobilizer Activity Tolerance: Patient limited by fatigue;Patient limited by pain Patient left: in chair;with call bell/phone within reach     Bufalo B. Amit Meloy, PT, DPT [phone]   01/15/2013, 2:22 PM

## 2013-01-15 NOTE — Progress Notes (Signed)
[facility] PROGRESS NOTE  [name] QMV:[id number] DOB: 1922-06-25 DOA: 01/12/2013 PCP: Kristian Covey, MD  Assessment/Plan: Principal Problem:   Falls Active Problems:   GERD (gastroesophageal reflux disease)   TIA (transient ischemic attack)   Tibial plateau fracture    1. Falls: Patient presented with recurrent falls, of unclear etiology. Differentials included age-related gait instability, possible TIAs, deconditioning, and dizziness. She had no focal neurologic deficit, and Head CT scan was devoid of acute findings. 12-Lead EKG shows SR, and no acute ischemic changes. Lipid profile showed TC 147, TG 48, HDL 107. LDL 30. TSH is normal at 1.844. No arrhythmias were documented on telemetric monitoring telemetric monitoring, brain MRI showed no acute intracranial abnormality and MRA was normal for age. 2D Echocardiogram showed normal LV cavity size, normal wall thickness and EF of 55% to 60%. Subcostal and apical views suggest RA mass, not well delineated. No PFO. Have consulted [place] [facility]ology, for TEE, and this is likely to be done on 01/18/13. Meanwhile, on low dose ASA, as well as Meclizine. PT/OT has evaluated, and SNF is recommended.  2. Query TIA (transient ischemic attack): See above discussion.  3. Tibial plateau fracture: This has complicated fall, described in #1. Patient is currently unable to weight-bear. Managing with analgesics. Dr Luiz Blare provided orthopedic consultation, and recommended conservative management with touch-down weight-bearing and immobilization.   4. Wounds: Patient has lacerations/abrasions of RUE and LLE, due to fall. Have consulted [facility].  5. GERD (gastroesophageal reflux disease): Asymptomatic on PPI.     Code Status: Full Code.  Family Communication:  Disposition Plan: To be determined.    Brief narrative: 79 year old female with history of OA, SCC RLE, s/p removal, s/p left breast lumpectomy for benign lesion decades ago, GERD,  otherwise, relatively healthy and independent who lives at home alone, presenting to [facility] ED with frequent falls at home, the last occuring earlier in afternoon of 01/12/13. She explains she has had similar episode in the past about one week ago, was seen at the urgent care, told she probably had a TIA, and she was sent home. In afternoon of 01/12/13, she felt somewhat dizzy, lost her balance and fell. She is worried that she can no longer continue to care for herself at home. She denies chest pain or shortness of breath, no abdominal or urinary concerns, no specific focal neurological symptoms. X-Ray of left Tibia/Fibular, revealed a left tibial plateau fracture. Admitted for further evaluation and management. She is scheduled to see Dr Luiz Blare, orthopedic surgeon, on 01/14/13.    Consultants:  N/A.   Procedures:  X-Ray Left Tibia/Fibula.  X-Ray Left Knee.  CXR.  Head CT Scan.    Antibiotics:  N/A.   HPI/Subjective: Comfortable, no acute pain.   Objective: Vital signs in last 24 hours: Temp:  [97.5 F (36.4 C)-98.2 F (36.8 C)] 97.7 F (36.5 C) (09/26 0954) Pulse Rate:  [79-86] 79 (09/26 0954) Resp:  [18-20] 20 (09/26 0954) BP: (113-138)/(55-89) 129/67 mmHg (09/26 0954) SpO2:  [94 %-98 %] 98 % (09/26 0954) Weight change:  Last BM Date: 01/14/13  Intake/Output from previous day: 09/25 0701 - 09/26 0700 In: 240 [P.O.:240] Out: 400 [Urine:400]     Physical Exam: General: Comfortable, alert, communicative, fully oriented, not short of breath at rest.  HEENT:  No clinical pallor, no jaundice, no conjunctival injection or discharge. Hydration status is satisfactory.  NECK:  Supple, JVP not seen, no carotid bruits, no palpable lymphadenopathy, no palpable goiter. CHEST:  Clinically clear to auscultation,  no wheezes, no crackles. HEART:  Sounds 1 and 2 heard, normal, regular, no murmurs. ABDOMEN:  Flat, soft, non-tender, no palpable organomegaly, no palpable masses, normal bowel  sounds. GENITALIA:  Not examined. UPPER EXTREMITIES: Bruising is noted on right arm, and right forearm is under dressings.  LOWER EXTREMITIES:  No pitting edema, palpable peripheral pulses. Dressings on medial aspect of left lower leg, as well as bruising. Left knee in in immobilizer.  MUSCULOSKELETAL SYSTEM:  Has a moderated left knee effusion, and decreased ROM left knee, otherwise, generalized osteoarthritic changes. CENTRAL NERVOUS SYSTEM:  No focal neurologic deficit on gross examination.  Lab Results:  Recent Labs  01/14/13 0630 01/15/13 0605  WBC 7.6 8.6  HGB 12.0 12.4  HCT 35.5* 36.2  PLT 157 185    Recent Labs  01/14/13 0630 01/15/13 0605  NA 138 136  K 4.2 4.3  CL 103 102  CO2 29 24  GLUCOSE 87 91  BUN 17 17  CREATININE 0.73 0.67  CALCIUM 8.7 8.8   No results found for this or any previous visit (from the past 240 hour(s)).   Studies/Results: Mri Brain Without Contrast  01/13/2013   CLINICAL DATA:  TIA. Frequent falls recently. Dizziness.  EXAM: MRI HEAD WITHOUT CONTRAST  MRA HEAD WITHOUT CONTRAST  TECHNIQUE: Multiplanar, multiecho pulse sequences of the brain and surrounding structures were obtained without intravenous contrast. Angiographic images of the head were obtained using MRA technique without contrast.  COMPARISON:  Did CT head without contrast 01/13/2013.  FINDINGS: MRI HEAD FINDINGS  Midline intracranial structures are within normal limits for age. Moderate degenerative changes are evident at multiple levels in the upper cervical spine, including C1-2. Diffusion-weighted images demonstrate no evidence for acute or subacute infarction. Mild generalized atrophy and periventricular white matter changes are evident bilaterally. No hemorrhage or mass lesion is present. With Flow is present in the major intracranial arteries. The patient is status post bilateral lens extractions. The paranasal sinuses and mastoid air cells are clear.  MRA HEAD FINDINGS  The  internal carotid arteries are within normal limits from the high cervical segments through the ICA termini bilaterally. The A1 and M1 segments are normal. The ACA and MCA branch vessels are within normal limits for age. No significant proximal stenosis, aneurysm, or branch vessel occlusion is present.  The left vertebral artery is slightly dominant to the right. The left PICA origin is visualized and within normal limits. The right AICA is dominant. The basilar artery is within normal limits. Both posterior cerebral arteries originate from the basilar tip. The PCA branch vessels are within normal limits bilaterally.  IMPRESSION: MRI HEAD IMPRESSION  1. Mild generalized atrophy and white matter disease is likely within normal limits for age. 2. No acute intracranial abnormality.  MRA HEAD IMPRESSION  1. Normal MRA of the brain for age. 2. No evidence for significant proximal stenosis, aneurysm, or branch vessel occlusion.   Electronically Signed   By: Gennette Pac   On: 01/13/2013 17:04   Mr Maxine Glenn Head/brain Wo Cm  01/13/2013   CLINICAL DATA:  TIA. Frequent falls recently. Dizziness.  EXAM: MRI HEAD WITHOUT CONTRAST  MRA HEAD WITHOUT CONTRAST  TECHNIQUE: Multiplanar, multiecho pulse sequences of the brain and surrounding structures were obtained without intravenous contrast. Angiographic images of the head were obtained using MRA technique without contrast.  COMPARISON:  Did CT head without contrast 01/13/2013.  FINDINGS: MRI HEAD FINDINGS  Midline intracranial structures are within normal limits for age. Moderate degenerative changes are  evident at multiple levels in the upper cervical spine, including C1-2. Diffusion-weighted images demonstrate no evidence for acute or subacute infarction. Mild generalized atrophy and periventricular white matter changes are evident bilaterally. No hemorrhage or mass lesion is present. With Flow is present in the major intracranial arteries. The patient is status post bilateral  lens extractions. The paranasal sinuses and mastoid air cells are clear.  MRA HEAD FINDINGS  The internal carotid arteries are within normal limits from the high cervical segments through the ICA termini bilaterally. The A1 and M1 segments are normal. The ACA and MCA branch vessels are within normal limits for age. No significant proximal stenosis, aneurysm, or branch vessel occlusion is present.  The left vertebral artery is slightly dominant to the right. The left PICA origin is visualized and within normal limits. The right AICA is dominant. The basilar artery is within normal limits. Both posterior cerebral arteries originate from the basilar tip. The PCA branch vessels are within normal limits bilaterally.  IMPRESSION: MRI HEAD IMPRESSION  1. Mild generalized atrophy and white matter disease is likely within normal limits for age. 2. No acute intracranial abnormality.  MRA HEAD IMPRESSION  1. Normal MRA of the brain for age. 2. No evidence for significant proximal stenosis, aneurysm, or branch vessel occlusion.   Electronically Signed   By: Gennette Pac   On: 01/13/2013 17:04    Medications: Scheduled Meds: . aspirin EC  325 mg Oral Daily  . beta carotene w/minerals  1 tablet Oral Daily  . calcium-vitamin D  1 tablet Oral Daily  . enoxaparin (LOVENOX) injection  40 mg Subcutaneous Q24H  . loperamide  2 mg Oral TID  . meclizine  12.5 mg Oral TID  . multivitamin with minerals  1 tablet Oral Daily  . pantoprazole  40 mg Oral Daily  . vitamin C  125 mg Oral Daily   Continuous Infusions:   PRN Meds:.acetaminophen, ibuprofen    LOS: 3 days   Laurens Matheny,CHRISTOPHER  [facility] Pager [phone]. If 8PM-8AM, please contact night-coverage at [email or web address], password [facility] 01/15/2013, 1:23 PM  LOS: 3 days

## 2013-01-15 NOTE — Consult Note (Signed)
WOC consult Note Reason for Consult: Multiple skin tears to bilateral forearms and left dorsal calf from fall, present on admission.  Wound type: Trauma Measurement: Left forearm:  3 cm x 1 cm x 0.1 Right forearm 10 cm x 2 cm x 0.1 cm Left calf 8 cm x 2 cm x 0.1 cm Wound bed: All areas are skin tears with edges approximated and Steri strips and/or dermal glue affixed.  All wounds bleed easily from wound edge.  Drainage (amount, consistency, odor) moderate serosanguinous drainage on all wounds.  Periwound: Bruising circumferentially to all wounds. All extremities are bruised and skin is paper thin.  Dressing procedure/placement/frequency: Dressings removed by softening with NS and wound beds cleansed.  Apply Xeroform gauze to wound bed and topped with 4x4 gauze for absorption.  Wrapped all wounds with kerlix and secured with paper tape.  No adhesive to be used on the patient's skin.  Change daily. And may change 4x4 and gauze if strike through is noted.  Will not follow at this time.  Please re-consult if needed.  Maple Hudson RN BSN [facility] Pager [phone]

## 2013-01-16 NOTE — Progress Notes (Signed)
[facility] PROGRESS NOTE  [name] ZOX:[id number] DOB: Dec 24, 1922 DOA: 01/12/2013 PCP: Kristian Covey, MD  Assessment/Plan: Principal Problem:   Falls Active Problems:   GERD (gastroesophageal reflux disease)   TIA (transient ischemic attack)   Tibial plateau fracture    1. Falls: Patient presented with recurrent falls, of unclear etiology. Differentials included age-related gait instability, possible TIAs, deconditioning, and dizziness. She had no focal neurologic deficit, and Head CT scan was devoid of acute findings. 12-Lead EKG shows SR, and no acute ischemic changes. Lipid profile showed TC 147, TG 48, HDL 107. LDL 30. TSH is normal at 1.844. No arrhythmias were documented on telemetric monitoring telemetric monitoring, brain MRI showed no acute intracranial abnormality and MRA was normal for age. 2D Echocardiogram showed normal LV cavity size, normal wall thickness and EF of 55% to 60%. No PFO (See #2 below, for further details). Meanwhile, on low dose ASA, as well as Meclizine. PT/OT has evaluated, and SNF is recommended. No recurrence.  2. Intracardiac mass: In addition to findings described in #1 above, 2D Echocardiogram subcostal and apical views suggest RA mass, not well delineated. Have consulted [place] cardiology, for TEE, and this is likely to be done on 01/18/13. 3. Query TIA (transient ischemic attack): See above discussion.  4. Tibial plateau fracture: This has complicated fall, described in #1. Patient is currently unable to weight-bear. Managing with analgesics. Dr Luiz Blare provided orthopedic consultation, and recommended conservative management with touch-down weight-bearing and immobilization.   5. Wounds: Patient has lacerations/abrasions of RUE and LLE, due to fall. Have consulted [facility], and we are managing as recommended.   6. GERD (gastroesophageal reflux disease): Asymptomatic on PPI.     Code Status: Full Code.  Family Communication:  Disposition  Plan: To be determined.    Brief narrative: 79 year old female with history of OA, SCC RLE, s/p removal, s/p left breast lumpectomy for benign lesion decades ago, GERD, otherwise, relatively healthy and independent who lives at home alone, presenting to [facility] ED with frequent falls at home, the last occuring earlier in afternoon of 01/12/13. She explains she has had similar episode in the past about one week ago, was seen at the urgent care, told she probably had a TIA, and she was sent home. In afternoon of 01/12/13, she felt somewhat dizzy, lost her balance and fell. She is worried that she can no longer continue to care for herself at home. She denies chest pain or shortness of breath, no abdominal or urinary concerns, no specific focal neurological symptoms. X-Ray of left Tibia/Fibular, revealed a left tibial plateau fracture. Admitted for further evaluation and management. She is scheduled to see Dr Luiz Blare, orthopedic surgeon, on 01/14/13.    Consultants:  N/A.   Procedures:  X-Ray Left Tibia/Fibula.  X-Ray Left Knee.  CXR.  Head CT Scan.    Antibiotics:  N/A.   HPI/Subjective: No new issues.   Objective: Vital signs in last 24 hours: Temp:  [97.7 F (36.5 C)-98.6 F (37 C)] 98.1 F (36.7 C) (09/27 0928) Pulse Rate:  [75-95] 90 (09/27 0928) Resp:  [18-20] 20 (09/27 0928) BP: (111-159)/(51-78) 132/66 mmHg (09/27 0928) SpO2:  [95 %-100 %] 100 % (09/27 0928) Weight change:  Last BM Date: 01/14/13  Intake/Output from previous day:       Physical Exam: General: Comfortable, alert, communicative, fully oriented, not short of breath at rest.  HEENT:  No clinical pallor, no jaundice, no conjunctival injection or discharge. Hydration status is satisfactory.  NECK:  Supple,  JVP not seen, no carotid bruits, no palpable lymphadenopathy, no palpable goiter. CHEST:  Clinically clear to auscultation, no wheezes, no crackles. HEART:  Sounds 1 and 2 heard, normal, regular, no  murmurs. ABDOMEN:  Flat, soft, non-tender, no palpable organomegaly, no palpable masses, normal bowel sounds. GENITALIA:  Not examined. UPPER EXTREMITIES: Bruising is noted on right arm, and right forearm is under dressings.  LOWER EXTREMITIES:  No pitting edema, palpable peripheral pulses. Dressings on medial aspect of left lower leg, as well as bruising. Left knee in in immobilizer.  MUSCULOSKELETAL SYSTEM:  Has a moderated left knee effusion, and decreased ROM left knee, otherwise, generalized osteoarthritic changes. CENTRAL NERVOUS SYSTEM:  No focal neurologic deficit on gross examination.  Lab Results:  Recent Labs  01/14/13 0630 01/15/13 0605  WBC 7.6 8.6  HGB 12.0 12.4  HCT 35.5* 36.2  PLT 157 185    Recent Labs  01/14/13 0630 01/15/13 0605  NA 138 136  K 4.2 4.3  CL 103 102  CO2 29 24  GLUCOSE 87 91  BUN 17 17  CREATININE 0.73 0.67  CALCIUM 8.7 8.8   No results found for this or any previous visit (from the past 240 hour(s)).   Studies/Results: No results found.  Medications: Scheduled Meds: . aspirin EC  325 mg Oral Daily  . beta carotene w/minerals  1 tablet Oral Daily  . calcium-vitamin D  1 tablet Oral Daily  . enoxaparin (LOVENOX) injection  40 mg Subcutaneous Q24H  . loperamide  2 mg Oral TID  . meclizine  12.5 mg Oral TID  . multivitamin with minerals  1 tablet Oral Daily  . pantoprazole  40 mg Oral Daily  . vitamin C  125 mg Oral Daily   Continuous Infusions:   PRN Meds:.acetaminophen, ibuprofen    LOS: 4 days   Aleene Swanner,CHRISTOPHER  [facility] Pager [phone]. If 8PM-8AM, please contact night-coverage at [email or web address], password [facility] 01/16/2013, 12:06 PM  LOS: 4 days

## 2013-01-17 LAB — BASIC METABOLIC PANEL
BUN: 21 mg/dL (ref 6–23)
Calcium: 8.7 mg/dL (ref 8.4–10.5)
Chloride: 102 mEq/L (ref 96–112)
Creatinine, Ser: 0.74 mg/dL (ref 0.50–1.10)
GFR calc Af Amer: 85 mL/min — ABNORMAL LOW (ref >90)
GFR calc non Af Amer: 74 mL/min — ABNORMAL LOW (ref >90)
Sodium: 138 mEq/L (ref 135–145)

## 2013-01-17 LAB — CBC
HCT: 33.9 % — ABNORMAL LOW (ref 36.0–46.0)
MCH: 30.4 pg (ref 26.0–34.0)
MCHC: 34.2 g/dL (ref 30.0–36.0)
MCV: 89 fL (ref 78.0–100.0)
Platelets: 191 10*3/uL (ref 150–400)
RDW: 14 % (ref 11.5–15.5)

## 2013-01-17 NOTE — Progress Notes (Signed)
[facility] PROGRESS NOTE  [name] ZOX:[id number] DOB: [birth year] DOA: 01/12/2013 PCP: Kristian Covey, MD  Assessment/Plan: Principal Problem:   Falls Active Problems:   GERD (gastroesophageal reflux disease)   TIA (transient ischemic attack)   Tibial plateau fracture    1. Falls: Patient presented with recurrent falls, of unclear etiology. Differentials included age-related gait instability, possible TIAs, deconditioning, and dizziness. She had no focal neurologic deficit, and Head CT scan was devoid of acute findings. 12-Lead EKG shows SR, and no acute ischemic changes. Lipid profile showed TC 147, TG 48, HDL 107. LDL 30. TSH is normal at 1.844. No arrhythmias were documented on telemetric monitoring telemetric monitoring, brain MRI showed no acute intracranial abnormality and MRA was normal for age. 2D Echocardiogram showed normal LV cavity size, normal wall thickness and EF of 55% to 60%. No PFO (See #2 below, for further details). Meanwhile, on low dose ASA, as well as Meclizine. PT/OT has evaluated, and SNF is recommended. No recurrence. Hve discontinued telemetry today.  2. Intracardiac mass: In addition to findings described in #1 above, 2D Echocardiogram subcostal and apical views suggest RA mass, not well delineated. Have consulted [place] [facility], for TEE, and this is likely to be done on 01/18/13. 3. Query TIA (transient ischemic attack): See above discussion.  4. Tibial plateau fracture: This has complicated fall, described in #1. Patient is currently unable to weight-bear. Managing with analgesics. Dr Luiz Blare provided orthopedic consultation, and recommended conservative management with touch-down weight-bearing and immobilization.   5. Wounds: Patient has lacerations/abrasions of RUE and LLE, due to fall. Have consulted [facility], and we are managing as recommended.   6. GERD (gastroesophageal reflux disease): Asymptomatic on PPI.     Code Status: Full Code.   Family Communication:  Disposition Plan: To be determined.    Brief narrative: 79 year old female with history of OA, SCC RLE, s/p removal, s/p left breast lumpectomy for benign lesion decades ago, GERD, otherwise, relatively healthy and independent who lives at home alone, presenting to [facility] ED with frequent falls at home, the last occuring earlier in afternoon of 01/12/13. She explains she has had similar episode in the past about one week ago, was seen at the urgent care, told she probably had a TIA, and she was sent home. In afternoon of 01/12/13, she felt somewhat dizzy, lost her balance and fell. She is worried that she can no longer continue to care for herself at home. She denies chest pain or shortness of breath, no abdominal or urinary concerns, no specific focal neurological symptoms. X-Ray of left Tibia/Fibular, revealed a left tibial plateau fracture. Admitted for further evaluation and management. She is scheduled to see Dr Luiz Blare, orthopedic surgeon, on 01/14/13.    Consultants:  N/A.   Procedures:  X-Ray Left Tibia/Fibula.  X-Ray Left Knee.  CXR.  Head CT Scan.    Antibiotics:  N/A.   HPI/Subjective: A little anxious, otherwise, no new issues.   Objective: Vital signs in last 24 hours: Temp:  [97.2 F (36.2 C)-98.2 F (36.8 C)] 97.2 F (36.2 C) (09/28 0700) Pulse Rate:  [76-90] 89 (09/28 0700) Resp:  [16-20] 18 (09/28 0700) BP: (118-142)/(59-63) 120/62 mmHg (09/28 0700) SpO2:  [95 %-98 %] 98 % (09/28 0700) Weight change:  Last BM Date: 01/16/13  Intake/Output from previous day:       Physical Exam: General: Comfortable, alert, communicative, fully oriented, not short of breath at rest.  HEENT:  No clinical pallor, no jaundice, no conjunctival injection or discharge.  Hydration status is satisfactory.  NECK:  Supple, JVP not seen, no carotid bruits, no palpable lymphadenopathy, no palpable goiter. CHEST:  Clinically clear to auscultation, no wheezes, no  crackles. HEART:  Sounds 1 and 2 heard, normal, regular, no murmurs. ABDOMEN:  Flat, soft, non-tender, no palpable organomegaly, no palpable masses, normal bowel sounds. GENITALIA:  Not examined. UPPER EXTREMITIES: Both forearms are under dressings.  LOWER EXTREMITIES:  No pitting edema, palpable peripheral pulses. Dressings on left lower leg, as well as bruising. Left knee in in immobilizer.  MUSCULOSKELETAL SYSTEM:  Has a moderated left knee effusion, and decreased ROM left knee, otherwise, generalized osteoarthritic changes. CENTRAL NERVOUS SYSTEM:  No focal neurologic deficit on gross examination.  Lab Results:  Recent Labs  01/15/13 0605 01/17/13 0509  WBC 8.6 6.3  HGB 12.4 11.6*  HCT 36.2 33.9*  PLT 185 191    Recent Labs  01/15/13 0605 01/17/13 0509  NA 136 138  K 4.3 4.3  CL 102 102  CO2 24 28  GLUCOSE 91 93  BUN 17 21  CREATININE 0.67 0.74  CALCIUM 8.8 8.7   No results found for this or any previous visit (from the past 240 hour(s)).   Studies/Results: No results found.  Medications: Scheduled Meds: . aspirin EC  325 mg Oral Daily  . beta carotene w/minerals  1 tablet Oral Daily  . calcium-vitamin D  1 tablet Oral Daily  . enoxaparin (LOVENOX) injection  40 mg Subcutaneous Q24H  . loperamide  2 mg Oral TID  . meclizine  12.5 mg Oral TID  . multivitamin with minerals  1 tablet Oral Daily  . pantoprazole  40 mg Oral Daily  . vitamin C  125 mg Oral Daily   Continuous Infusions:   PRN Meds:.acetaminophen, ibuprofen    LOS: 5 days   Lisa-Marie Rueger,CHRISTOPHER  [facility] Pager [phone]. If 8PM-8AM, please contact night-coverage at [email or web address], password [email or web address] 01/17/2013, 11:23 AM  LOS: 5 days

## 2013-01-18 ENCOUNTER — Encounter (HOSPITAL_COMMUNITY)
Admission: EM | Disposition: A | Discharge status: Skilled Nursing Facility | Payer: Self-pay | Source: Home / Self Care | Attending: Internal Medicine

## 2013-01-18 ENCOUNTER — Encounter (HOSPITAL_COMMUNITY): Payer: Self-pay | Admitting: *Deleted

## 2013-01-18 DIAGNOSIS — M79609 Pain in unspecified limb: Secondary | ICD-10-CM | POA: Diagnosis not present

## 2013-01-18 DIAGNOSIS — M6281 Muscle weakness (generalized): Secondary | ICD-10-CM | POA: Diagnosis not present

## 2013-01-18 DIAGNOSIS — Z4789 Encounter for other orthopedic aftercare: Secondary | ICD-10-CM | POA: Diagnosis not present

## 2013-01-18 DIAGNOSIS — M25519 Pain in unspecified shoulder: Secondary | ICD-10-CM | POA: Diagnosis not present

## 2013-01-18 DIAGNOSIS — G459 Transient cerebral ischemic attack, unspecified: Secondary | ICD-10-CM | POA: Diagnosis not present

## 2013-01-18 DIAGNOSIS — M25569 Pain in unspecified knee: Secondary | ICD-10-CM | POA: Diagnosis not present

## 2013-01-18 DIAGNOSIS — IMO0002 Reserved for concepts with insufficient information to code with codable children: Secondary | ICD-10-CM | POA: Diagnosis not present

## 2013-01-18 DIAGNOSIS — W19XXXA Unspecified fall, initial encounter: Secondary | ICD-10-CM | POA: Diagnosis not present

## 2013-01-18 DIAGNOSIS — S82109A Unspecified fracture of upper end of unspecified tibia, initial encounter for closed fracture: Secondary | ICD-10-CM | POA: Diagnosis not present

## 2013-01-18 DIAGNOSIS — G4700 Insomnia, unspecified: Secondary | ICD-10-CM | POA: Diagnosis not present

## 2013-01-18 DIAGNOSIS — R1312 Dysphagia, oropharyngeal phase: Secondary | ICD-10-CM | POA: Diagnosis not present

## 2013-01-18 DIAGNOSIS — Z9181 History of falling: Secondary | ICD-10-CM | POA: Diagnosis not present

## 2013-01-18 DIAGNOSIS — R262 Difficulty in walking, not elsewhere classified: Secondary | ICD-10-CM | POA: Diagnosis not present

## 2013-01-18 DIAGNOSIS — S8990XA Unspecified injury of unspecified lower leg, initial encounter: Secondary | ICD-10-CM | POA: Diagnosis not present

## 2013-01-18 DIAGNOSIS — R269 Unspecified abnormalities of gait and mobility: Secondary | ICD-10-CM | POA: Diagnosis not present

## 2013-01-18 DIAGNOSIS — W19XXXS Unspecified fall, sequela: Secondary | ICD-10-CM | POA: Diagnosis not present

## 2013-01-18 DIAGNOSIS — K219 Gastro-esophageal reflux disease without esophagitis: Secondary | ICD-10-CM | POA: Diagnosis not present

## 2013-01-18 HISTORY — PX: TEE WITHOUT CARDIOVERSION: SHX5443

## 2013-01-18 SURGERY — ECHOCARDIOGRAM, TRANSESOPHAGEAL
Anesthesia: Moderate Sedation

## 2013-01-18 MED ORDER — MIDAZOLAM HCL 10 MG/2ML IJ SOLN
INTRAMUSCULAR | Status: DC | PRN
  Administered 2013-01-18 (×2): 1 mg via INTRAVENOUS

## 2013-01-18 MED ORDER — FENTANYL CITRATE 0.05 MG/ML IJ SOLN
INTRAMUSCULAR | Status: DC | PRN
  Administered 2013-01-18 (×2): 25 ug via INTRAVENOUS

## 2013-01-18 MED ORDER — SODIUM CHLORIDE 0.9 % IV SOLN
INTRAVENOUS | Status: DC
  Administered 2013-01-18: 500 mL via INTRAVENOUS

## 2013-01-18 MED ORDER — MIDAZOLAM HCL 5 MG/ML IJ SOLN
INTRAMUSCULAR | Status: AC
  Filled 2013-01-18: qty 2

## 2013-01-18 MED ORDER — SODIUM CHLORIDE 0.9 % IV SOLN: INTRAVENOUS | Status: DC

## 2013-01-18 MED ORDER — IBUPROFEN 400 MG PO TABS: 400.0000 mg | ORAL_TABLET | Freq: Three times a day (TID) | ORAL | Status: DC | PRN

## 2013-01-18 MED ORDER — FENTANYL CITRATE 0.05 MG/ML IJ SOLN
INTRAMUSCULAR | Status: AC
  Filled 2013-01-18: qty 2

## 2013-01-18 MED ORDER — ENOXAPARIN SODIUM 40 MG/0.4ML ~~LOC~~ SOLN: 40.0000 mg | SUBCUTANEOUS | Status: DC

## 2013-01-18 MED ORDER — BUTAMBEN-TETRACAINE-BENZOCAINE 2-2-14 % EX AERO
INHALATION_SPRAY | CUTANEOUS | Status: DC | PRN
  Administered 2013-01-18: 2 via TOPICAL

## 2013-01-18 MED ORDER — ACETAMINOPHEN 325 MG PO TABS: 650.0000 mg | ORAL_TABLET | ORAL | Status: DC | PRN

## 2013-01-18 NOTE — Progress Notes (Signed)
Physical Therapy Treatment Patient Details Name: [name] MRN: [id number] DOB: [birth year] Today's Date: 01/18/2013 Time: 7829-5621 PT Time Calculation (min): 12 min  PT Assessment / Plan / Recommendation  History of Present Illness 79yo female admitted to [facility] on 01/12/13 s/p fall at home with Left tibial plateau fx. Pt admitted for completion of TIA work up and evaluation of frequent falls at home. MRI/MRA negative for acute stroke findings.  Ortho consulted with non-surgical management of fracture.  Pt is to be TDWB L leg and KI on at all times (per ortho consult note).      PT Comments   Pt just back from TEE, wanting to get up to chair to eat lunch, so limited session completed.  PT to check back later for gait and exercises as time allows.    Follow Up Recommendations  SNF     Does the patient have the potential to tolerate intense rehabilitation    Yes  Barriers to Discharge   None      Equipment Recommendations  None recommended by PT    Recommendations for Other Services   None  Frequency Min 3X/week   Progress towards PT Goals Progress towards PT goals: Progressing toward goals  Plan Current plan remains appropriate    Precautions / Restrictions Precautions Precautions: Fall Required Braces or Orthoses: Knee Immobilizer - Left Knee Immobilizer - Left: On at all times Restrictions LLE Weight Bearing: Touchdown weight bearing   Pertinent Vitals/Pain See vitals flow sheet.    Mobility  Bed Mobility Supine to Sit: 6: Modified independent (Device/Increase time);With rails;HOB elevated Sitting - Scoot to Edge of Bed: 6: Modified independent (Device/Increase time);With rail Sit to Supine: 6: Modified independent (Device/Increase time);With rail;HOB elevated Details for Bed Mobility Assistance: pt able to progress to EOB without (A) Transfers Sit to Stand: 4: Min guard Stand to Sit: 4: Min guard Stand Pivot Transfers: 4: Min guard Details for Transfer Assistance:  min guard assist for balance especially when backing up.  Verbal cues for safe use of RW, hand placement and to SLOW down speed of movement.   Ambulation/Gait Ambulation/Gait Assistance: Not tested (comment) (pt ready to eat lunch.  )      PT Goals (current goals can now be found in the care plan section) Acute Rehab PT Goals Patient Stated Goal: SNF to get better so she can go home.    Visit Information  Last PT Received On: 01/18/13 Assistance Needed: +1 Reason Eval/Treat Not Completed: Patient at procedure or test/unavailable (TEE) History of Present Illness: 79yo female admitted to [facility] on 01/12/13 s/p fall at home with Left tibial plateau fx. Pt admitted for completion of TIA work up and evaluation of frequent falls at home. MRI/MRA negative for acute stroke findings.  Ortho consulted with non-surgical management of fracture.  Pt is to be TDWB L leg and KI on at all times (per ortho consult note).       Subjective Data  Subjective: Pt just got back from TEE.  Wants to eat lunch in the chair.   Patient Stated Goal: SNF to get better so she can go home.     Cognition  Cognition Arousal/Alertness: Awake/alert Behavior During Therapy: WFL for tasks assessed/performed Memory: Decreased recall of precautions (decreased carryover of education)    Balance  Static Sitting Balance Static Sitting - Balance Support: No upper extremity supported;Feet supported Static Sitting - Level of Assistance: 7: Independent Static Standing Balance Static Standing - Balance Support: Bilateral upper extremity  supported Static Standing - Level of Assistance: 4: Min assist Dynamic Standing Balance Dynamic Standing - Balance Support: Bilateral upper extremity supported Dynamic Standing - Level of Assistance: 4: Min assist  End of Session PT - End of Session Activity Tolerance: Patient tolerated treatment well Patient left: in chair;with call bell/phone within reach;with chair alarm set     Juergen Hardenbrook B.  Caroline Longie, PT, DPT [phone]   01/18/2013, 2:33 PM

## 2013-01-18 NOTE — Progress Notes (Signed)
  Echocardiogram Echocardiogram Transesophageal has been performed.  [name] 01/18/2013, 2:20 PM

## 2013-01-18 NOTE — Progress Notes (Signed)
Patient transported via PTA to [facility].  [facility] would not transport her walker so patient left it behind and a friend of her came to get the walker and is taking it to the patient at the facility.  Lance Bosch, RN

## 2013-01-18 NOTE — Progress Notes (Signed)
PT Cancellation Note  Patient Details Name: MARYLON VERNO MRN: [id number] DOB: 06/22/1922   Cancelled Treatment:    Reason Eval/Treat Not Completed: Patient at procedure or test/unavailable (TEE)   Lurena Joiner B. Frutoso Dimare, PT, DPT [phone]   01/18/2013, 1:27 PM

## 2013-01-18 NOTE — Progress Notes (Signed)
Patient education and consent signed for TEE later today.  Lance Bosch, RN

## 2013-01-18 NOTE — CV Procedure (Signed)
See full report in [employer] Probable chiari network in RA (normal variant). No RA mass.  [name]

## 2013-01-18 NOTE — Discharge Summary (Signed)
Physician Discharge Summary  [name] AVW:[id number] DOB: [birth year] DOA: 01/12/2013  PCP: Kristian Covey, MD  Admit date: 01/12/2013 Discharge date: 01/18/2013  Time spent: 40 minutes  Recommendations for Outpatient Follow-up:  1. Follow up with primary MD.  2. Follow up with Dr Jodi Geralds, orthopedic surgeon.   Discharge Diagnoses:  Principal Problem:   Falls Active Problems:   GERD (gastroesophageal reflux disease)   TIA (transient ischemic attack)   Tibial plateau fracture   Discharge Condition: Satisfactory.   Diet recommendation: Heart-Healthy.   Filed Weights   01/13/13 0400  Weight: 48.807 kg (107 lb 9.6 oz)    History of present illness:  79 year old female with history of OA, SCC RLE, s/p removal, s/p left breast lumpectomy for benign lesion decades ago, GERD, otherwise, relatively healthy and independent who lives at home alone, presenting to [facility] ED with frequent falls at home, the last occuring earlier in afternoon of 01/12/13. She explains she has had similar episode in the past about one week ago, was seen at the urgent care, told she probably had a TIA, and she was sent home. In afternoon of 01/12/13, she felt somewhat dizzy, lost her balance and fell. She is worried that she can no longer continue to care for herself at home. She denies chest pain or shortness of breath, no abdominal or urinary concerns, no specific focal neurological symptoms. X-Ray of left Tibia/Fibular, revealed a left tibial plateau fracture. Admitted for further evaluation and management. She is scheduled to see Dr Luiz Blare, orthopedic surgeon, on 01/14/13.    Hospital Course:  1. Falls: Patient presented with recurrent falls, of unclear etiology. Differentials included age-related gait instability, possible TIAs, deconditioning, and dizziness. She had no focal neurologic deficit, and Head CT scan was devoid of acute findings. 12-Lead EKG showed SR, and no acute ischemic changes. Lipid  profile showed TC 147, TG 48, HDL 107. LDL 30. TSH is normal at 1.844. No arrhythmias were documented on telemetric monitoring telemetric monitoring, brain MRI showed no acute intracranial abnormality and MRA was normal for age. 2D Echocardiogram showed normal LV cavity size, normal wall thickness and EF of 55% to 60%. No PFO (See #2 below, for further details). Meanwhile, on low dose ASA, as well as Meclizine. PT/OT has evaluated, and SNF is recommended. No recurrence of symptoms, during hospitalization.  2. Query intracardiac mass: In addition to findings described in #1 above, 2D Echocardiogram subcostal and apical views suggested RA mass, not well delineated. Dr Olga Millers, cardiologist was consulted for TEE, which was done on 01/18/13 and showed probable chiari network in RA (normal variant). No RA mass. Patient has been reassured accordingly.  3. Query TIA (transient ischemic attack): See above discussion.  4. Tibial plateau fracture: This has complicated fall, described in #1. Dr Luiz Blare provided orthopedic consultation, and recommended conservative management with touch-down weight-bearing and immobilization. Managed with analgesics. Stable. Patient will follow with Dr Luiz Blare, on discharge.  5. Wounds: Patient susrtained lacerations/abrasions of bilateral UE and LLE, due to fall. WOC was consulted, and patient managed as recommended.  6. GERD (gastroesophageal reflux disease): Asymptomatic on PPI.    Procedures:  See Below.  2D Echocardiogram.  TEE.   Consultations:  Dr Jodi Geralds, orthopedic surgeon.  Cardiology.   Discharge Exam: Filed Vitals:   01/18/13 1342  BP: 152/56  Pulse: 98  Temp: 97.2 F (36.2 C)  Resp:     General: Comfortable, alert, communicative, fully oriented, not short of breath at rest.  HEENT: No  clinical pallor, no jaundice, no conjunctival injection or discharge. Hydration status is satisfactory.  NECK: Supple, JVP not seen, no carotid bruits, no  palpable lymphadenopathy, no palpable goiter.  CHEST: Clinically clear to auscultation, no wheezes, no crackles.  HEART: Sounds 1 and 2 heard, normal, regular, no murmurs.  ABDOMEN: Flat, soft, non-tender, no palpable organomegaly, no palpable masses, normal bowel sounds.  GENITALIA: Not examined.  UPPER EXTREMITIES: Both forearms are under dressings.  LOWER EXTREMITIES: No pitting edema, palpable peripheral pulses. Dressings on left lower leg, as well as bruising. Left knee in in immobilizer.  MUSCULOSKELETAL SYSTEM: Has a moderated left knee effusion, and decreased ROM left knee, otherwise, generalized osteoarthritic changes.  CENTRAL NERVOUS SYSTEM: No focal neurologic deficit on gross examination.  Discharge Instructions      Discharge Orders   Future Appointments Provider Department Dept Phone   01/05/2014 2:00 PM Kristian Covey, MD [facility] [phone]   Future Orders Complete By Expires   Diet - low sodium heart healthy  As directed    Increase activity slowly  As directed        Medication List         acetaminophen 325 MG tablet  Commonly known as:  TYLENOL  Take 2 tablets (650 mg total) by mouth every 4 (four) hours as needed.     aspirin EC 81 MG tablet  Take 81 mg by mouth daily.     CALCIUM 600 + D PO  Take 1 tablet by mouth daily.     enoxaparin 40 MG/0.4ML injection  Commonly known as:  LOVENOX  Inject 0.4 mLs (40 mg total) into the skin daily.     esomeprazole 40 MG capsule  Commonly known as:  NEXIUM  Take 1 capsule (40 mg total) by mouth daily before breakfast.     ibuprofen 400 MG tablet  Commonly known as:  ADVIL,MOTRIN  Take 1 tablet (400 mg total) by mouth 3 (three) times daily with meals as needed.     IMODIUM PO  Take 1 each by mouth 3 (three) times daily. Per Dr Lauralyn Primes     multivitamin tablet  Take 1 tablet by mouth daily.     OCUVITE PO  Take 1 tablet by mouth daily.     VITAMIN B 12 PO  Take 1 tablet by  mouth daily.     vitamin C 100 MG tablet  Take 100 mg by mouth daily.       Allergies  Allergen Reactions  . Penicillin G Itching  . Septra [Bactrim]     Stomach concerns.    Follow-up Information   Follow up with GRAVES,JOHN L, MD. Schedule an appointment as soon as possible for a visit in 2 weeks.   Specialty:  Orthopedic Surgery   Contact information:   [address] [place] [address] [phone]       Schedule an appointment as soon as possible for a visit with Kristian Covey, MD.   Specialty:  Family Medicine   Contact information:   [address] [place] [address] [phone]        The results of significant diagnostics from this hospitalization (including imaging, microbiology, ancillary and laboratory) are listed below for reference.    Significant Diagnostic Studies: Dg Tibia/fibula Left  01/12/2013   *RADIOLOGY REPORT*  Clinical Data: Left knee pain status post fall  LEFT TIBIA AND FIBULA - 2 VIEW  Comparison: Concurrently obtained radiographs of the left knee  Findings: Irregularity  of the cortical trabecular pattern in the lateral tibial plateau better demonstrated on concurrently obtained radiographs of the knee.  No evidence of acute fracture in the mid or distal tibia or fibula.  Scattered atherosclerotic calcifications are noted.  No ankle joint effusion.  Visualized bones and joints of the foot are unremarkable.  IMPRESSION: Probable impacted lateral tibial plateau fracture.  No evidence of fracture or malalignment in the mid or distal lower leg.  Clinically significant discrepancy from primary report, if provided: None   Original Report Authenticated By: Malachy Moan, M.D.   Ct Head Wo Contrast  01/13/2013   CLINICAL DATA:  Near syncope  EXAM: CT HEAD WITHOUT CONTRAST  TECHNIQUE: Contiguous axial images were obtained from the base of the skull through the vertex without intravenous contrast.  COMPARISON:  01/07/2013  FINDINGS:  Skull:No acute osseous abnormality. No lytic or blastic lesion.  Orbits: Bilateral cataract resection.  Brain: No evidence of acute abnormality, such as acute infarction, hemorrhage, hydrocephalus, or mass lesion/mass effect. Unchanged pattern of bilateral cerebral white matter low-attenuation, confluent around the lateral ventricles. Global brain atrophy, commensurate with age. Intracranial atherosclerosis.  IMPRESSION: 1. No evidence of acute intracranial disease. 2. Senescent brain changes, age appropriate.   Electronically Signed   By: Tiburcio Pea   On: 01/13/2013 02:30   Ct Head Wo Contrast  01/07/2013   CLINICAL DATA:  Headache. TIA.  EXAM: CT HEAD WITHOUT CONTRAST  TECHNIQUE: Contiguous axial images were obtained from the base of the skull through the vertex without intravenous contrast.  COMPARISON:  CT 08/29/2011  FINDINGS: Generalized atrophy. Mild chronic microvascular ischemic change in the white matter. Negative for hydrocephalus.  Negative for acute infarct, hemorrhage, or mass lesion.  Calvarium is intact.  IMPRESSION: Atrophy and chronic microvascular ischemic change. No acute abnormality and no change from the prior CT.   Electronically Signed   By: Marlan Palau M.D.   On: 01/07/2013 11:40   Mri Brain Without Contrast  01/13/2013   CLINICAL DATA:  TIA. Frequent falls recently. Dizziness.  EXAM: MRI HEAD WITHOUT CONTRAST  MRA HEAD WITHOUT CONTRAST  TECHNIQUE: Multiplanar, multiecho pulse sequences of the brain and surrounding structures were obtained without intravenous contrast. Angiographic images of the head were obtained using MRA technique without contrast.  COMPARISON:  Did CT head without contrast 01/13/2013.  FINDINGS: MRI HEAD FINDINGS  Midline intracranial structures are within normal limits for age. Moderate degenerative changes are evident at multiple levels in the upper cervical spine, including C1-2. Diffusion-weighted images demonstrate no evidence for acute or subacute  infarction. Mild generalized atrophy and periventricular white matter changes are evident bilaterally. No hemorrhage or mass lesion is present. With Flow is present in the major intracranial arteries. The patient is status post bilateral lens extractions. The paranasal sinuses and mastoid air cells are clear.  MRA HEAD FINDINGS  The internal carotid arteries are within normal limits from the high cervical segments through the ICA termini bilaterally. The A1 and M1 segments are normal. The ACA and MCA branch vessels are within normal limits for age. No significant proximal stenosis, aneurysm, or branch vessel occlusion is present.  The left vertebral artery is slightly dominant to the right. The left PICA origin is visualized and within normal limits. The right AICA is dominant. The basilar artery is within normal limits. Both posterior cerebral arteries originate from the basilar tip. The PCA branch vessels are within normal limits bilaterally.  IMPRESSION: MRI HEAD IMPRESSION  1. Mild generalized atrophy and white  matter disease is likely within normal limits for age. 2. No acute intracranial abnormality.  MRA HEAD IMPRESSION  1. Normal MRA of the brain for age. 2. No evidence for significant proximal stenosis, aneurysm, or branch vessel occlusion.   Electronically Signed   By: Gennette Pac   On: 01/13/2013 17:04   US Carotid Duplex Bilateral  01/07/2013   *RADIOLOGY REPORT*  Clinical Data: Transient blurred vision, possible transient ischemic attack  BILATERAL CAROTID DUPLEX ULTRASOUND  Technique: Wallace Cullens scale imaging, color Doppler and duplex ultrasound were performed of bilateral carotid and vertebral arteries in the neck.  Comparison:  Head CT 08/29/2011; prior duplex carotid ultrasound 01/16/2011  Criteria:  Quantification of carotid stenosis is based on velocity parameters that correlate the residual internal carotid diameter with NASCET-based stenosis levels, using the diameter of the distal internal  carotid lumen as the denominator for stenosis measurement.  The following velocity measurements were obtained:                   PEAK SYSTOLIC/END DIASTOLIC RIGHT ICA:                        76/13cm/sec CCA:                        92/11cm/sec SYSTOLIC ICA/CCA RATIO:     0.8 DIASTOLIC ICA/CCA RATIO:    1.2 ECA:                        67cm/sec  LEFT ICA:                        56/14cm/sec CCA:                        72/11cm/sec SYSTOLIC ICA/CCA RATIO:     0.8 DIASTOLIC ICA/CCA RATIO:    1.3 ECA:                        70cm/sec  Findings:  RIGHT CAROTID ARTERY: Trace heterogeneous atherosclerotic plaque in the carotid bulb extending into the proximal internal and external carotid arteries.  No significant stenosis by gray scale, color Doppler, pulsed Doppler or spectral waveform analysis.  RIGHT VERTEBRAL ARTERY:  Patent with normal antegrade flow.  LEFT CAROTID ARTERY: Trace calcified atherosclerotic plaque in the carotid bifurcations without evidence of significant stenosis by gray scale, color Doppler, pulsed Doppler or spectral waveform analysis.  LEFT VERTEBRAL ARTERY:  Patent with normal antegrade flow.  IMPRESSION:  Trace atherosclerotic plaque results in less than 50% stenoses of the bilateral internal carotid arteries.  No significant interval progression compared to 01/16/2011.  Signed,  Sterling Big, MD [facility]   Original Report Authenticated By: Malachy Moan, M.D.   Dg Chest Port 1 View  01/13/2013   CLINICAL DATA:  Syncope.  EXAM: PORTABLE CHEST - 1 VIEW  COMPARISON:  None.  FINDINGS: Heart size and mediastinal contours are within normal limits. Both lungs are clear. Visualized skeletal structures are unremarkable.  IMPRESSION: No active disease.   Electronically Signed   By: Drusilla Kanner M.D.   On: 01/13/2013 02:13   Dg Knee Complete 4 Views Left  01/12/2013   *RADIOLOGY REPORT*  Clinical Data: Fall, knee pain  LEFT KNEE -  COMPLETE 4+ VIEW  Comparison: Prior knee radiographs 08/31/2011  Findings: Large knee joint effusion.  Trabecular irregularity in the lateral tibial plateau concerning for impacted plateau fracture.  Additionally, there is a subtle lucency.  The bones are mildly osteopenic.  IMPRESSION: Agree with preliminary report: probable impacted lateral tibial plateau fracture with associated large suprapatellar joint effusion.  Atherosclerotic calcifications in the superficial femoral artery.  Clinically significant discrepancy from primary report, if provided: None   Original Report Authenticated By: Malachy Moan, M.D.   Mr Mra Head/brain Wo Cm  01/13/2013   CLINICAL DATA:  TIA. Frequent falls recently. Dizziness.  EXAM: MRI HEAD WITHOUT CONTRAST  MRA HEAD WITHOUT CONTRAST  TECHNIQUE: Multiplanar, multiecho pulse sequences of the brain and surrounding structures were obtained without intravenous contrast. Angiographic images of the head were obtained using MRA technique without contrast.  COMPARISON:  Did CT head without contrast 01/13/2013.  FINDINGS: MRI HEAD FINDINGS  Midline intracranial structures are within normal limits for age. Moderate degenerative changes are evident at multiple levels in the upper cervical spine, including C1-2. Diffusion-weighted images demonstrate no evidence for acute or subacute infarction. Mild generalized atrophy and periventricular white matter changes are evident bilaterally. No hemorrhage or mass lesion is present. With Flow is present in the major intracranial arteries. The patient is status post bilateral lens extractions. The paranasal sinuses and mastoid air cells are clear.  MRA HEAD FINDINGS  The internal carotid arteries are within normal limits from the high cervical segments through the ICA termini bilaterally. The A1 and M1 segments are normal. The ACA and MCA branch vessels are within normal limits for age. No significant proximal stenosis, aneurysm, or branch vessel  occlusion is present.  The left vertebral artery is slightly dominant to the right. The left PICA origin is visualized and within normal limits. The right AICA is dominant. The basilar artery is within normal limits. Both posterior cerebral arteries originate from the basilar tip. The PCA branch vessels are within normal limits bilaterally.  IMPRESSION: MRI HEAD IMPRESSION  1. Mild generalized atrophy and white matter disease is likely within normal limits for age. 2. No acute intracranial abnormality.  MRA HEAD IMPRESSION  1. Normal MRA of the brain for age. 2. No evidence for significant proximal stenosis, aneurysm, or branch vessel occlusion.   Electronically Signed   By: Gennette Pac   On: 01/13/2013 17:04    Microbiology: No results found for this or any previous visit (from the past 240 hour(s)).   Labs: Basic Metabolic Panel:  Recent Labs Lab 01/13/13 0155 01/13/13 1130 01/14/13 0630 01/15/13 0605 01/17/13 0509  NA 140 135 138 136 138  K 4.5 4.4 4.2 4.3 4.3  CL 103 100 103 102 102  CO2 26 25 29 24 28   GLUCOSE 115* 100* 87 91 93  BUN 21 18 17 17 21   CREATININE 0.62 0.62 0.73 0.67 0.74  CALCIUM 8.6 8.6 8.7 8.8 8.7   Liver Function Tests:  Recent Labs Lab 01/13/13 0155  AST 29  ALT 19  ALKPHOS 63  BILITOT 0.4  PROT 5.8*  ALBUMIN 3.6   No results found for this basename: LIPASE, AMYLASE,  in the last 168 hours No results found for this basename: AMMONIA,  in the last 168 hours CBC:  Recent Labs Lab 01/13/13 0155 01/13/13 1130 01/14/13 0630 01/15/13 0605 01/17/13 0509  WBC 10.4 9.0 7.6 8.6 6.3  NEUTROABS 8.3*  --   --   --   --   HGB 12.7 12.3 12.0 12.4 11.6*  HCT 35.8* 36.1 35.5*  36.2 33.9*  MCV 88.6 88.3 89.9 89.4 89.0  PLT 166 171 157 185 191   Cardiac Enzymes:  Recent Labs Lab 01/13/13 0155  TROPONINI <0.30   BNP: BNP (last 3 results) No results found for this basename: PROBNP,  in the last 8760 hours CBG:  Recent Labs Lab 01/13/13 1305  01/13/13 1644 01/13/13 2108 01/14/13 0718 01/14/13 1132  GLUCAP 131* 96 93 85 80       Signed:  Erum Cercone,CHRISTOPHER  [facility] 01/18/2013, 3:35 PM

## 2013-01-18 NOTE — Progress Notes (Signed)
Occupational Therapy Treatment Patient Details Name: [name] MRN: [id number] DOB: 08-Jul-1922 Today's Date: 01/18/2013 Time: 4098-1191 OT Time Calculation (min): 19 min  OT Assessment / Plan / Recommendation  History of present illness 79yo female admitted to [facility] on 01/12/13 s/p fall at home with Left tibial plateau fx. Pt admitted for completion of TIA work up and evaluation of frequent falls at home. MRI/MRA negative for acute stroke findings.  Ortho consulted with non-surgical management of fracture.  Pt is to be TDWB L leg and KI on at all times (per ortho consult note).      OT comments  Pt progressing this session however with poor recall of previous OT/PT session education. Pt now with personal RW in room and educated on safety / sequence with RW. Pt benefits from repetition of same education and becomes frustrated with new information added. Pt states "I am learning new stuff just as I am leaving." Pt educated again on length of recovery and gradual incr changes with therapy to help progress toward return to independence. OT attempting to explain that education will be on going and not all provided all at once.   Follow Up Recommendations  SNF;Supervision/Assistance - 24 hour    Barriers to Discharge       Equipment Recommendations  3 in 1 bedside comode;Wheelchair (measurements OT);Wheelchair cushion (measurements OT);Other (comment)    Recommendations for Other Services    Frequency Min 2X/week   Progress towards OT Goals Progress towards OT goals: Progressing toward goals  Plan Discharge plan remains appropriate    Precautions / Restrictions Precautions Precautions: Fall Required Braces or Orthoses: Knee Immobilizer - Left Knee Immobilizer - Left: On at all times Restrictions Weight Bearing Restrictions: Yes LLE Weight Bearing: Touchdown weight bearing   Pertinent Vitals/Pain Nausea Pain in Left LE s/p ambulation Pt concerned with injury to Lt LE due to mobility  and assured that the discomfort is due to progressing not because she did anything wrong. Pt states "okay that already makes me feel better"    ADL  Grooming: Wash/dry face;Modified independent Where Assessed - Grooming: Unsupported sitting Toilet Transfer: Min Pension scheme manager Method: Sit to Barista: Raised toilet seat with arms (or 3-in-1 over toilet) Equipment Used: Gait belt;Rolling walker;Knee Immobilizer Transfers/Ambulation Related to ADLs: Pt wanting to ambulate in room and outside of room due to lack of mobility this weekend. Pt reports some nausea prior to mobility. Pt educated on walker left right sequence for safety. Pt moving very fast in RW and needs cues to decr speed. pt states "I am calling Dr Luiz Blare to get an appointment in two weeks once I am at [facility]. I am a going person and this way of doing things just won't do for 8 weeks." Pt very impulsive in movement during transfer and needing cues for safety. pt very fearful of injury to LT LE due to weight bearing but requesting that Dr Luiz Blare incr weight bearing. Pt with decr speed and supervision able to complete RW mobility with decr (A).  ADL Comments: Pt needing to blow nose frequently during session. pt reports nausea and not eating this AM due to pending TEE. Pt completed bed mobility MOD I with HOB 50 degrees. Pt required reeducation on sequence and safety. pt initially placing BIL LE in front of RW wheels and then trying to move RW. pt with much improvement by end of session. Pt educated that new bone takes 6 weeks to form and 8 weeks  to harden so this weight bearing RW use is neccessary. Pt very discouraged by the length of time.     OT Diagnosis:    OT Problem List:   OT Treatment Interventions:     OT Goals(current goals can now be found in the care plan section) Acute Rehab OT Goals Patient Stated Goal: SNF to get better so she can go home.   OT Goal Formulation: With patient Time For  Goal Achievement: 01/27/13 Potential to Achieve Goals: Good ADL Goals Pt Will Perform Lower Body Dressing: with min guard assist;with adaptive equipment;sit to/from stand Pt Will Transfer to Toilet: with supervision;bedside commode  Visit Information  Last OT Received On: 01/18/13 Assistance Needed: +1 History of Present Illness: 80yo female admitted to [facility] on 01/12/13 s/p fall at home with Left tibial plateau fx. Pt admitted for completion of TIA work up and evaluation of frequent falls at home. MRI/MRA negative for acute stroke findings.  Ortho consulted with non-surgical management of fracture.  Pt is to be TDWB L leg and KI on at all times (per ortho consult note).       Subjective Data      Prior Functioning       Cognition  Cognition Arousal/Alertness: Awake/alert Behavior During Therapy: WFL for tasks assessed/performed Overall Cognitive Status: Within Functional Limits for tasks assessed Memory: Decreased short-term memory (poor recall of RW use and education from previous sessions)    Mobility  Bed Mobility Bed Mobility: Supine to Sit;Sitting - Scoot to Edge of Bed Supine to Sit: 6: Modified independent (Device/Increase time);HOB elevated Sitting - Scoot to Edge of Bed: 6: Modified independent (Device/Increase time) Details for Bed Mobility Assistance: pt able to progress to EOB without (A) Transfers Transfers: Sit to Stand;Stand to Sit Sit to Stand: 4: Min guard;With upper extremity assist;From bed Stand to Sit: 4: Min guard;With upper extremity assist;To bed Details for Transfer Assistance: cue for safety with RW    Exercises      Balance     End of Session    Pt reports wanting to follow the rules and keep LT lE healing without further injury but at end of session plans to make an appointment with Dr Luiz Blare to incr weight bearing. Pt reports the decr in her speed and ability to get up and walk "just will not do." Pt needs constant frequent reinforcement of  education.     Boone Master B 01/18/2013, 10:57 AM Pager: [phone]

## 2013-01-18 NOTE — Progress Notes (Signed)
Physical Therapy Treatment Patient Details Name: [name] MRN: [id number] DOB: [birth year] Today's Date: 01/18/2013 Time: [phone] (~10 mins spent helping pt pack her belongings, no charge) PT Time Calculation (min): 44 min  PT Assessment / Plan / Recommendation  History of Present Illness 79yo female admitted to [facility] on 01/12/13 s/p fall at home with Left tibial plateau fx. Pt admitted for completion of TIA work up and evaluation of frequent falls at home. MRI/MRA negative for acute stroke findings.  Ortho consulted with non-surgical management of fracture.  Pt is to be TDWB L leg and KI on at all times (per ortho consult note).      PT Comments   I came back a second time today because the first session was interrupted by lunch.  Pt was able to ambulate further today with RW, but as she fatigued in her upper extremities she had a difficult time maintaining TDWB L leg.  She continues to need cues for safety and I believe she will not be safe for independent or mod I gait until she can be WBAT on her left leg.  SNF for rehab continues to be appropriate and she is due to d/c today.    Follow Up Recommendations  SNF     Does the patient have the potential to tolerate intense rehabilitation    Yes  Barriers to Discharge   None      Equipment Recommendations  None recommended by PT    Recommendations for Other Services   None  Frequency Min 3X/week   Progress towards PT Goals Progress towards PT goals: Progressing toward goals  Plan Current plan remains appropriate    Precautions / Restrictions Precautions Precautions: Fall Precaution Comments: h/o falls  Required Braces or Orthoses: Knee Immobilizer - Left Knee Immobilizer - Left: On at all times Restrictions Weight Bearing Restrictions: Yes LLE Weight Bearing: Touchdown weight bearing   Pertinent Vitals/Pain See vitals flow sheet.     Mobility  Bed Mobility Bed Mobility: Supine to Sit;Sitting - Scoot to Edge of Bed;Sit  to Supine Supine to Sit: 6: Modified independent (Device/Increase time) Sitting - Scoot to Edge of Bed: 6: Modified independent (Device/Increase time) Sit to Supine: 6: Modified independent (Device/Increase time) Details for Bed Mobility Assistance: pt able to progress to EOB without (A) Transfers Sit to Stand: 4: Min guard Stand to Sit: 4: Min guard Stand Pivot Transfers: 4: Min guard Details for Transfer Assistance: min guard assist for balance especially when backing up.  Verbal cues for safe use of RW, hand placement and to SLOW down speed of movement.   Ambulation/Gait Ambulation/Gait Assistance: 4: Min assist Ambulation Distance (Feet): 65 Feet Assistive device: Rolling walker Ambulation/Gait Assistance Details: min assist for balance and safety during mobility as pt is quick to move and often off balance due to TDWB L Leg.   Gait Pattern: Step-to pattern;Trunk flexed (hop-to) Gait velocity: pt encouraged to slow down as she moves before she is balanced.   Modified Rankin (Stroke Patients Only) Pre-Morbid Rankin Score: No symptoms Modified Rankin: Moderately severe disability    Exercises General Exercises - Upper Extremity Shoulder Flexion: AROM;Both;10 reps;Seated Elbow Flexion: AROM;Both;10 reps;Seated (right elbow more gently due to edema and pain at end ROM) General Exercises - Lower Extremity Ankle Circles/Pumps: AROM;Both;20 reps;Supine Gluteal Sets: AROM;Both;10 reps;Supine Heel Slides: AROM;Right;10 reps;Supine Hip ABduction/ADduction: AROM;Both;10 reps;Supine Straight Leg Raises: AROM;Both;10 reps;Supine     PT Goals (current goals can now be found in the care plan section) Acute  Rehab PT Goals Patient Stated Goal: SNF to get better so she can go home.    Visit Information  Last PT Received On: 01/18/13 Assistance Needed: +1 Reason Eval/Treat Not Completed: Patient at procedure or test/unavailable (TEE) History of Present Illness: 79yo female admitted to [facility]  on 01/12/13 s/p fall at home with Left tibial plateau fx. Pt admitted for completion of TIA work up and evaluation of frequent falls at home. MRI/MRA negative for acute stroke findings.  Ortho consulted with non-surgical management of fracture.  Pt is to be TDWB L leg and KI on at all times (per ortho consult note).       Subjective Data  Subjective: Pt is now ready for gait and TE with PT.  She is excited to get stronger, but frustrated about how long it will take for her bone to heal.   Patient Stated Goal: SNF to get better so she can go home.     Cognition  Cognition Arousal/Alertness: Awake/alert Behavior During Therapy: WFL for tasks assessed/performed Memory: Decreased recall of precautions    Balance  Static Sitting Balance Static Sitting - Balance Support: No upper extremity supported;Feet supported Static Sitting - Level of Assistance: 7: Independent Static Standing Balance Static Standing - Balance Support: Bilateral upper extremity supported Static Standing - Level of Assistance: 4: Min assist Dynamic Standing Balance Dynamic Standing - Balance Support: Bilateral upper extremity supported Dynamic Standing - Level of Assistance: 4: Min assist Dynamic Standing - Comments: stood at sink discussing how to best/safest stabilize for balance while preforming grooming tasks.  Min assist needed to steady pt for balance.  I still believe that it would be safer for her to preform her self care tasks sitting at the sink, but it is good balance practice to preform them standing with assistance.    End of Session PT - End of Session Equipment Utilized During Treatment: Left knee immobilizer Activity Tolerance: Patient tolerated treatment well Patient left: in bed;with call bell/phone within reach   Sumner B. Dakin [name], PT, DPT [phone]   01/18/2013, 4:33 PM

## 2013-01-18 NOTE — Interval H&P Note (Signed)
History and Physical Interval Note:  01/18/2013 12:17 PM  [name]  has presented today for surgery, with the diagnosis of stroke  The various methods of treatment have been discussed with the patient and family. After consideration of risks, benefits and other options for treatment, the patient has consented to  Procedure(s): TRANSESOPHAGEAL ECHOCARDIOGRAM (TEE) (N/A) as a surgical intervention .  The patient's history has been reviewed, patient examined, no change in status, stable for surgery.  I have reviewed the patient's chart and labs.  Questions were answered to the patient's satisfaction.     Olga Millers

## 2013-01-18 NOTE — H&P (View-Only) (Signed)
[facility] PROGRESS NOTE  [name] MRN:[id number] DOB: 10/04/1922 DOA: 01/12/2013 PCP: BURCHETTE,BRUCE W, MD  Assessment/Plan: Principal Problem:   Falls Active Problems:   GERD (gastroesophageal reflux disease)   TIA (transient ischemic attack)   Tibial plateau fracture    1. Falls: Patient presented with recurrent falls, of unclear etiology. Differentials included age-related gait instability, possible TIAs, deconditioning, and dizziness. She had no focal neurologic deficit, and Head CT scan was devoid of acute findings. 12-Lead EKG shows SR, and no acute ischemic changes. Lipid profile showed TC 147, TG 48, HDL 107. LDL 30. TSH is normal at 1.844. No arrhythmias were documented on telemetric monitoring telemetric monitoring, brain MRI showed no acute intracranial abnormality and MRA was normal for age. 2D Echocardiogram showed normal LV cavity size, normal wall thickness and EF of 55% to 60%. No PFO (See #2 below, for further details). Meanwhile, on low dose ASA, as well as Meclizine. PT/OT has evaluated, and SNF is recommended. No recurrence. Hve discontinued telemetry today.  2. Intracardiac mass: In addition to findings described in #1 above, 2D Echocardiogram subcostal and apical views suggest RA mass, not well delineated. Have consulted [place] [facility]ology, for TEE, and this is likely to be done on 01/18/13. 3. Query TIA (transient ischemic attack): See above discussion.  4. Tibial plateau fracture: This has complicated fall, described in #1. Patient is currently unable to weight-bear. Managing with analgesics. Dr Graves provided orthopedic consultation, and recommended conservative management with touch-down weight-bearing and immobilization.   5. Wounds: Patient has lacerations/abrasions of RUE and LLE, due to fall. Have consulted WOC, and we are managing as recommended.   6. GERD (gastroesophageal reflux disease): Asymptomatic on PPI.     Code Status: Full Code.   Family Communication:  Disposition Plan: To be determined.    Brief narrative: 79-year-old female with history of OA, SCC RLE, s/p removal, s/p left breast lumpectomy for benign lesion decades ago, GERD, otherwise, relatively healthy and independent who lives at home alone, presenting to [facility] ED with frequent falls at home, the last occuring earlier in afternoon of 01/12/13. She explains she has had similar episode in the past about one week ago, was seen at the urgent care, told she probably had a TIA, and she was sent home. In afternoon of 01/12/13, she felt somewhat dizzy, lost her balance and fell. She is worried that she can no longer continue to care for herself at home. She denies chest pain or shortness of breath, no abdominal or urinary concerns, no specific focal neurological symptoms. X-Ray of left Tibia/Fibular, revealed a left tibial plateau fracture. Admitted for further evaluation and management. She is scheduled to see Dr Graves, orthopedic surgeon, on 01/14/13.    Consultants:  N/A.   Procedures:  X-Ray Left Tibia/Fibula.  X-Ray Left Knee.  CXR.  Head CT Scan.    Antibiotics:  N/A.   HPI/Subjective: A little anxious, otherwise, no new issues.   Objective: Vital signs in last 24 hours: Temp:  [97.2 F (36.2 C)-98.2 F (36.8 C)] 97.2 F (36.2 C) (09/28 0700) Pulse Rate:  [76-90] 89 (09/28 0700) Resp:  [16-20] 18 (09/28 0700) BP: (118-142)/(59-63) 120/62 mmHg (09/28 0700) SpO2:  [95 %-98 %] 98 % (09/28 0700) Weight change:  Last BM Date: 01/16/13  Intake/Output from previous day:       Physical Exam: General: Comfortable, alert, communicative, fully oriented, not short of breath at rest.  HEENT:  No clinical pallor, no jaundice, no conjunctival injection or discharge.   Hydration status is satisfactory.  NECK:  Supple, JVP not seen, no carotid bruits, no palpable lymphadenopathy, no palpable goiter. CHEST:  Clinically clear to auscultation, no wheezes, no  crackles. HEART:  Sounds 1 and 2 heard, normal, regular, no murmurs. ABDOMEN:  Flat, soft, non-tender, no palpable organomegaly, no palpable masses, normal bowel sounds. GENITALIA:  Not examined. UPPER EXTREMITIES: Both forearms are under dressings.  LOWER EXTREMITIES:  No pitting edema, palpable peripheral pulses. Dressings on left lower leg, as well as bruising. Left knee in in immobilizer.  MUSCULOSKELETAL SYSTEM:  Has a moderated left knee effusion, and decreased ROM left knee, otherwise, generalized osteoarthritic changes. CENTRAL NERVOUS SYSTEM:  No focal neurologic deficit on gross examination.  Lab Results:  Recent Labs  01/15/13 0605 01/17/13 0509  WBC 8.6 6.3  HGB 12.4 11.6*  HCT 36.2 33.9*  PLT 185 191    Recent Labs  01/15/13 0605 01/17/13 0509  NA 136 138  K 4.3 4.3  CL 102 102  CO2 24 28  GLUCOSE 91 93  BUN 17 21  CREATININE 0.67 0.74  CALCIUM 8.8 8.7   No results found for this or any previous visit (from the past 240 hour(s)).   Studies/Results: No results found.  Medications: Scheduled Meds: . aspirin EC  325 mg Oral Daily  . beta carotene w/minerals  1 tablet Oral Daily  . calcium-vitamin D  1 tablet Oral Daily  . enoxaparin (LOVENOX) injection  40 mg Subcutaneous Q24H  . loperamide  2 mg Oral TID  . meclizine  12.5 mg Oral TID  . multivitamin with minerals  1 tablet Oral Daily  . pantoprazole  40 mg Oral Daily  . vitamin C  125 mg Oral Daily   Continuous Infusions:   PRN Meds:.acetaminophen, ibuprofen    LOS: 5 days   [name],CHRISTOPHER  [facility] Pager [phone]. If 8PM-8AM, please contact night-coverage at [email or web address], password [email or web address] 01/17/2013, 11:23 AM  LOS: 5 days     

## 2013-01-19 ENCOUNTER — Encounter (HOSPITAL_COMMUNITY): Payer: Self-pay | Admitting: Cardiology

## 2013-01-20 ENCOUNTER — Encounter: Payer: Self-pay | Admitting: Nurse Practitioner

## 2013-01-20 ENCOUNTER — Non-Acute Institutional Stay (SKILLED_NURSING_FACILITY): Payer: Medicare Other | Admitting: Nurse Practitioner

## 2013-01-20 DIAGNOSIS — L03113 Cellulitis of right upper limb: Secondary | ICD-10-CM

## 2013-01-20 DIAGNOSIS — IMO0002 Reserved for concepts with insufficient information to code with codable children: Secondary | ICD-10-CM | POA: Diagnosis not present

## 2013-01-20 DIAGNOSIS — S40811A Abrasion of right upper arm, initial encounter: Secondary | ICD-10-CM | POA: Insufficient documentation

## 2013-01-20 DIAGNOSIS — W19XXXS Unspecified fall, sequela: Secondary | ICD-10-CM | POA: Diagnosis not present

## 2013-01-20 DIAGNOSIS — K219 Gastro-esophageal reflux disease without esophagitis: Secondary | ICD-10-CM

## 2013-01-20 DIAGNOSIS — S40811S Abrasion of right upper arm, sequela: Secondary | ICD-10-CM

## 2013-01-20 DIAGNOSIS — R197 Diarrhea, unspecified: Secondary | ICD-10-CM

## 2013-01-20 DIAGNOSIS — G459 Transient cerebral ischemic attack, unspecified: Secondary | ICD-10-CM

## 2013-01-20 DIAGNOSIS — S98139A Complete traumatic amputation of one unspecified lesser toe, initial encounter: Secondary | ICD-10-CM

## 2013-01-20 DIAGNOSIS — Z89421 Acquired absence of other right toe(s): Secondary | ICD-10-CM | POA: Insufficient documentation

## 2013-01-20 NOTE — Assessment & Plan Note (Signed)
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

## 2013-01-20 NOTE — Assessment & Plan Note (Signed)
Stable on PPI 

## 2013-01-20 NOTE — Assessment & Plan Note (Signed)
Presently taking ASA 81mg . Lovenox  40mg  sq while in hospital was discontinue upon hospital discharge-verified with in patient Pharmacy.

## 2013-01-20 NOTE — Assessment & Plan Note (Signed)
Swelling, warmth, tenderness, open areas x2--will start Doxycycline 100mg  bid for 7 days--observe the patient.

## 2013-01-20 NOTE — Assessment & Plan Note (Signed)
Dr Luiz Blare provided orthopedic consultation, and recommended conservative management with touch-down weight-bearing and immobilization. Managed with analgesics. Stable. Patient will follow with Dr Luiz Blare

## 2013-01-20 NOTE — Assessment & Plan Note (Signed)
Patient presented to ED with recurrent falls, of unclear etiology. Differentials included age-related gait instability, possible TIAs, deconditioning, and dizziness. She had no focal neurologic deficit, and Head CT scan was devoid of acute findings. 12-Lead EKG showed SR, and no acute ischemic changes. Lipid profile showed TC 147, TG 48, HDL 107. LDL 30. TSH is normal at 1.844. No arrhythmias were documented on telemetric monitoring telemetric monitoring, brain MRI showed no acute intracranial abnormality and MRA was normal for age. 2D Echocardiogram showed normal LV cavity size, normal wall thickness and EF of 55% to 60%. No PFO (See #2 below, for further details). Meanwhile, on low dose ASA, as well as Meclizine. PT/OT at [facility] is to return to [place] @ [facility]

## 2013-01-20 NOTE — Assessment & Plan Note (Signed)
Patient susrtained lacerations/abrasions of bilateral UE and LLE, due to fall. WOC was consulted, and patient managed as recommended.

## 2013-01-20 NOTE — Assessment & Plan Note (Signed)
Saw Dr. Sherri Rad in the past, Imodium worked for her

## 2013-01-20 NOTE — Progress Notes (Signed)
Patient ID: [name], female   DOB: 1923/02/21, 79 y.o.   MRN: [id number] Code Status: Full Code  Allergies  Allergen Reactions  . Penicillin G Itching  . Septra [Bactrim]     Stomach concerns.     Chief Complaint  Patient presents with  . Medical Managment of Chronic Issues  . Hospitalization Follow-up   HPI: Patient is a 79 y.o. female seen in the SNF at [facility] today for evaluation of  S/p hospitalization 01/12/13-01/18/13 for s/p fall, near syncope, ? TIA, Tibial plateau fx,  and other chronic medical conditions. Follow up with Dr Jodi Geralds, orthopedic surgeon.   79 year old female with history of OA, SCC RLE, s/p removal, s/p left breast lumpectomy for benign lesion decades ago, GERD, otherwise, relatively healthy and independent who lives at home alone, presenting to [facility] ED with frequent falls at home, the last occuring earlier in afternoon of 01/12/13. She explains she has had similar episode in the past about one week ago, was seen at the urgent care, told she probably had a TIA, and she was sent home. In afternoon of 01/12/13, she felt somewhat dizzy, lost her balance and fell. She is worried that she can no longer continue to care for herself at home.  2D Echocardiogram subcostal and apical views suggested RA mass, not well delineated. Dr Olga Millers, cardiologist was consulted for TEE, which was done on 01/18/13 and showed probable chiari network in RA (normal variant). No RA mass. Patient has been reassured accordingly.   Problem List Items Addressed This Visit   Abrasion of arm, right      Patient susrtained lacerations/abrasions of bilateral UE and LLE, due to fall. WOC was consulted, and patient managed as recommended.      Cellulitis of right arm     Swelling, warmth, tenderness, open areas x2--will start Doxycycline 100mg  bid for 7 days--observe the patient.     Falls - Primary     Patient presented to ED with recurrent falls, of unclear etiology.  Differentials included age-related gait instability, possible TIAs, deconditioning, and dizziness. She had no focal neurologic deficit, and Head CT scan was devoid of acute findings. 12-Lead EKG showed SR, and no acute ischemic changes. Lipid profile showed TC 147, TG 48, HDL 107. LDL 30. TSH is normal at 1.844. No arrhythmias were documented on telemetric monitoring telemetric monitoring, brain MRI showed no acute intracranial abnormality and MRA was normal for age. 2D Echocardiogram showed normal LV cavity size, normal wall thickness and EF of 55% to 60%. No PFO (See #2 below, for further details). Meanwhile, on low dose ASA, as well as Meclizine. PT/OT at [facility] is to return to [place] @ [facility]     Frequent loose stools     Saw Dr. Sherri Rad in the past, Imodium worked for her    GERD (gastroesophageal reflux disease)     Stable on PPI    History of amputation of lesser toe of right foot     S/p the  right 5th toe amputation about a year ago due to painful deformity from previous foot surgery 30 years ago.     TIA (transient ischemic attack)     Presently taking ASA 81mg . Lovenox  40mg  sq while in hospital was discontinue upon hospital discharge-verified with in patient Pharmacy.        Review of Systems:  Review of Systems  Constitutional: Negative for fever, chills, weight loss, malaise/fatigue and diaphoresis.  HENT: Negative for neck pain.  Eyes: Negative for blurred vision, double vision, photophobia, discharge and redness.  Respiratory: Negative for cough, hemoptysis, sputum production, shortness of breath and wheezing.   Cardiovascular: Positive for leg swelling. Negative for chest pain, palpitations, orthopnea, claudication and PND.       Mild LLE  Gastrointestinal: Positive for diarrhea. Negative for heartburn, nausea, vomiting, abdominal pain, constipation, blood in stool and melena.       Chronic diarrhea.   Genitourinary: Positive for frequency. Negative for dysuria, urgency,  hematuria and flank pain.  Musculoskeletal: Positive for joint pain and falls. Negative for myalgias and back pain.       Left knee  Skin: Negative for itching and rash.       Extensive abrasion, bruise RUE and LLE. RUE warm, tender, swelling, and open areas x2.   Neurological: Negative for dizziness, tingling, tremors, sensory change, speech change, focal weakness, seizures, loss of consciousness and weakness.  Endo/Heme/Allergies: Negative for environmental allergies and polydipsia. Does not bruise/bleed easily.  Psychiatric/Behavioral: Negative for depression, suicidal ideas and memory loss. The patient is nervous/anxious. The patient does not have insomnia.      Past Medical History  Diagnosis Date  . Arthritis   . Cancer     squamous cell  . Incontinence of urine    Past Surgical History  Procedure Laterality Date  . Breast surgery  1981    breast  . Tonsillectomy    . Abdominal hysterectomy    . Tee without cardioversion N/A 01/18/2013    Procedure: TRANSESOPHAGEAL ECHOCARDIOGRAM (TEE);  Surgeon: Lewayne Bunting, MD;  Location: [facility] ENDOSCOPY;  Service: Cardiovascular;  Laterality: N/A;   Social History:   reports that she quit smoking about 35 years ago. Her smoking use included Cigarettes. She has a 15 pack-year smoking history. She does not have any smokeless tobacco history on file. Her alcohol and drug histories are not on file.  Family History  Problem Relation Age of Onset  . Arthritis Mother   . Cancer Sister     breast    Medications: Patient's Medications  New Prescriptions   No medications on file  Previous Medications   ACETAMINOPHEN (TYLENOL) 325 MG TABLET    Take 2 tablets (650 mg total) by mouth every 4 (four) hours as needed.   ASCORBIC ACID (VITAMIN C) 100 MG TABLET    Take 100 mg by mouth daily.   ASPIRIN EC 81 MG TABLET    Take 81 mg by mouth daily.   CALCIUM CARBONATE-VITAMIN D (CALCIUM 600 + D PO)    Take 1 tablet by mouth daily.   CYANOCOBALAMIN  (VITAMIN B 12 PO)    Take 1 tablet by mouth daily.   ESOMEPRAZOLE (NEXIUM) 40 MG CAPSULE    Take 1 capsule (40 mg total) by mouth daily before breakfast.   IBUPROFEN (ADVIL,MOTRIN) 400 MG TABLET    Take 1 tablet (400 mg total) by mouth 3 (three) times daily with meals as needed.   LOPERAMIDE HCL (IMODIUM PO)    Take 1 each by mouth 3 (three) times daily. Per Dr Lauralyn Primes   MULTIPLE VITAMIN (MULTIVITAMIN) TABLET    Take 1 tablet by mouth daily.   MULTIPLE VITAMINS-MINERALS (OCUVITE PO)    Take 1 tablet by mouth daily.  Modified Medications   No medications on file  Discontinued Medications   ENOXAPARIN (LOVENOX) 40 MG/0.4ML INJECTION    Inject 0.4 mLs (40 mg total) into the skin daily.     Physical Exam: Physical Exam  Constitutional: She is oriented to person, place, and time. She appears well-developed and well-nourished. No distress.  HENT:  Head: Normocephalic and atraumatic.  Right Ear: External ear normal.  Left Ear: External ear normal.  Nose: Nose normal.  Mouth/Throat: Oropharynx is clear and moist. No oropharyngeal exudate.  Eyes: Conjunctivae and EOM are normal. Pupils are equal, round, and reactive to light. Right eye exhibits no discharge. Left eye exhibits no discharge. No scleral icterus.  Neck: Normal range of motion. Neck supple. No JVD present. No tracheal deviation present. No thyromegaly present.  Cardiovascular: Normal rate, regular rhythm, normal heart sounds and intact distal pulses.   No murmur heard. Pulmonary/Chest: Effort normal and breath sounds normal. No stridor. No respiratory distress. She has no wheezes. She has no rales.  Abdominal: Soft. Bowel sounds are normal. She exhibits no distension. There is no tenderness. There is no rebound and no guarding.  Musculoskeletal: She exhibits edema and tenderness.  The left knee pain. Knee immobilizer the left knee  Lymphadenopathy:    She has no cervical adenopathy.  Neurological: She is alert and oriented to person,  place, and time. She displays normal reflexes. No cranial nerve deficit. She exhibits normal muscle tone. Coordination normal.  Skin: No rash noted. She is not diaphoretic. There is erythema. No pallor.  Right right upper arm warmth, tenderness, swelling, open areas x2 R forearm with dry and fresh blood seen. Brownish pigmented BLE  Psychiatric: She has a normal mood and affect. Her behavior is normal. Judgment and thought content normal.  Talks constantly and changes her subjects frequently.     Filed Vitals:   01/20/13 1140  BP: 138/74  Pulse: 68  Temp: 97.5 F (36.4 C)  TempSrc: Tympanic  Resp: 22      Labs reviewed: Basic Metabolic Panel:  Recent Labs  16/10/96 0330  01/14/13 0630 01/15/13 0605 01/17/13 0509  NA  --   < > 138 136 138  K  --   < > 4.2 4.3 4.3  CL  --   < > 103 102 102  CO2  --   < > 29 24 28   GLUCOSE  --   < > 87 91 93  BUN  --   < > 17 17 21   CREATININE  --   < > 0.73 0.67 0.74  CALCIUM  --   < > 8.7 8.8 8.7  TSH 1.844  --   --   --   --   < > = values in this interval not displayed. Liver Function Tests:  Recent Labs  01/13/13 0155  AST 29  ALT 19  ALKPHOS 63  BILITOT 0.4  PROT 5.8*  ALBUMIN 3.6   CBC:  Recent Labs  02/25/12 1254 01/13/13 0155  01/14/13 0630 01/15/13 0605 01/17/13 0509  WBC 7.5 10.4  < > 7.6 8.6 6.3  NEUTROABS 4.7 8.3*  --   --   --   --   HGB 12.6 12.7  < > 12.0 12.4 11.6*  HCT 38.8 35.8*  < > 35.5* 36.2 33.9*  MCV 93.1 88.6  < > 89.9 89.4 89.0  PLT 235.0 166  < > 157 185 191  < > = values in this interval not displayed. Lipid Panel:  Recent Labs  01/13/13 1130  CHOL 147  HDL 107  LDLCALC 30  TRIG 48  CHOLHDL 1.4    Past Procedures:  01/07/2013 CLINICAL DATA: Headache. TIA. EXAM: CT HEAD WITHOUT CONTRAST IMPRESSION: Atrophy and chronic  microvascular ischemic change. No acute abnormality and no change from the prior  01/07/2013 *RADIOLOGY REPORT* Clinical Data: Transient blurred vision, possible  transient ischemic attack BILATERAL CAROTID DUPLEX ULTRASOUND IMPRESSION: Trace atherosclerotic plaque results in less than 50% stenoses of the bilateral internal carotid arteries. No significant interval progression compared to 01/16/2011.  01/12/2013 *RADIOLOGY REPORT* Clinical Data: Left knee pain status post fall LEFT TIBIA AND FIBULA -  IMPRESSION: Probable impacted lateral tibial plateau fracture. No evidence of fracture or malalignment in the mid or distal lower leg.    01/12/2013 *RADIOLOGY REPORT* Clinical Data: Fall, knee pain LEFT KNEE - COMPLETE 4+ VIEW IMPRESSION: Agree with preliminary report: probable impacted lateral tibial plateau fracture with associated large suprapatellar joint effusion. Atherosclerotic calcifications in the superficial femoral artery.   01/13/2013 CLINICAL DATA: TIA. Frequent falls recently. Dizziness. EXAM: MRI HEAD WITHOUT CONTRAST MRA HEAD WITHOUT CONTRAST MRI HEAD IMPRESSION 1. Mild generalized atrophy and white matter disease is likely within normal limits for age. 2. No acute intracranial abnormality. MRA HEAD IMPRESSION 1. Normal MRA of the brain for age. 2. No evidence for significant proximal stenosis, aneurysm, or branch vessel occlusion.  01/13/2013 CLINICAL DATA: Syncope. EXAM: PORTABLE CHEST - IMPRESSION: No active disease.   01/13/2013 CLINICAL DATA: Near syncope EXAM: CT HEAD WITHOUT CONTRAST IMPRESSION: 1. No evidence of acute intracranial disease. 2. Senescent brain changes, age appropriate.   Assessment/Plan Falls Patient presented to ED with recurrent falls, of unclear etiology. Differentials included age-related gait instability, possible TIAs, deconditioning, and dizziness. She had no focal neurologic deficit, and Head CT scan was devoid of acute findings. 12-Lead EKG showed SR, and no acute ischemic changes. Lipid profile showed TC 147, TG 48, HDL 107. LDL 30. TSH is normal at 1.844. No arrhythmias were documented on telemetric monitoring  telemetric monitoring, brain MRI showed no acute intracranial abnormality and MRA was normal for age. 2D Echocardiogram showed normal LV cavity size, normal wall thickness and EF of 55% to 60%. No PFO (See #2 below, for further details). Meanwhile, on low dose ASA, as well as Meclizine. PT[facility]OT at [facility] is to return to [place] @ [facility]   Tibial plateau fracture Dr Luiz Blare provided orthopedic consultation, and recommended conservative management with touch-down weight-bearing and immobilization. Managed with analgesics. Stable. Patient will follow with Dr Luiz Blare   GERD (gastroesophageal reflux disease) Stable on PPI  Abrasion of arm, right  Patient susrtained lacerations/abrasions of bilateral UE and LLE, due to fall. WOC was consulted, and patient managed as recommended.    TIA (transient ischemic attack) Presently taking ASA 81mg . Lovenox  40mg  sq while in hospital was discontinue upon hospital discharge-verified with in patient Pharmacy.   Cellulitis of right arm Swelling, warmth, tenderness, open areas x2--will start Doxycycline 100mg  bid for 7 days--observe the patient.   History of amputation of lesser toe of right foot S/p the  right 5th toe amputation about a year ago due to painful deformity from previous foot surgery 30 years ago.   Frequent loose stools Saw Dr. Sherri Rad in the past, Imodium worked for her    Family/ Staff Communication: observe the patient  Goals of Care: SNF  Labs/tests ordered: none

## 2013-01-28 DIAGNOSIS — M25569 Pain in unspecified knee: Secondary | ICD-10-CM | POA: Diagnosis not present

## 2013-02-05 DIAGNOSIS — M25519 Pain in unspecified shoulder: Secondary | ICD-10-CM | POA: Diagnosis not present

## 2013-02-08 ENCOUNTER — Encounter: Payer: Self-pay | Admitting: Nurse Practitioner

## 2013-02-08 ENCOUNTER — Non-Acute Institutional Stay (SKILLED_NURSING_FACILITY): Payer: Medicare Other | Admitting: Nurse Practitioner

## 2013-02-08 DIAGNOSIS — S8290XS Unspecified fracture of unspecified lower leg, sequela: Secondary | ICD-10-CM

## 2013-02-08 DIAGNOSIS — S82142S Displaced bicondylar fracture of left tibia, sequela: Secondary | ICD-10-CM

## 2013-02-08 DIAGNOSIS — IMO0002 Reserved for concepts with insufficient information to code with codable children: Secondary | ICD-10-CM

## 2013-02-08 DIAGNOSIS — L03113 Cellulitis of right upper limb: Secondary | ICD-10-CM

## 2013-02-08 DIAGNOSIS — G4700 Insomnia, unspecified: Secondary | ICD-10-CM | POA: Insufficient documentation

## 2013-02-08 DIAGNOSIS — M25519 Pain in unspecified shoulder: Secondary | ICD-10-CM | POA: Diagnosis not present

## 2013-02-08 DIAGNOSIS — K219 Gastro-esophageal reflux disease without esophagitis: Secondary | ICD-10-CM | POA: Diagnosis not present

## 2013-02-08 DIAGNOSIS — M25512 Pain in left shoulder: Secondary | ICD-10-CM

## 2013-02-08 NOTE — Progress Notes (Signed)
Patient ID: [name], female   DOB: 01/05/1923, 79 y.o.   MRN: [id number]  Code Status: Full Code  Allergies  Allergen Reactions  . Penicillin G Itching  . Septra [Bactrim]     Stomach concerns.     Chief Complaint  Patient presents with  . Acute Visit    left shoulder pain, insomnia   HPI: Patient is a 79 y.o. female seen in the SNF at [facility] today for evaluation of  Left shoulder pain, insomnia, S/p hospitalization 01/12/13-01/18/13 for s/p fall, near syncope, ? TIA, Tibial plateau fx,  and other chronic medical conditions.   Problem List Items Addressed This Visit   Cellulitis of right arm     Swelling, warmth, tenderness, open areas x2--much improved after treated with  Doxycycline 100mg  bid for 7 days      GERD (gastroesophageal reflux disease)     Stable on PPI      Insomnia     Able to fall asleep since Iburprofen 400mg  nightly started, but she stated she has trouble maintaining asleep--will try Mirtazapine 7.5mg  nightly.     Left shoulder pain - Primary     The anterior right shoulder pain with internal and external rotation-mild with slightly decreased AROM. X-ray of the left shoulder: mild to moderate osteopenia, mild osteoarthritis, no acute fracture, malalignment, or lytic destructive lesion, slight old healed fracture deformity at the lateral left clavicle. Will continue Ibuprofen for pain and PT as tolerated. F/u Ortho for the left tibial plateau fx.     Tibial plateau fracture     Dr Luiz Blare provided orthopedic consultation, and recommended conservative management with touch-down weight-bearing and immobilization. Managed with analgesics. Stable. Patient will follow with Dr Luiz Blare          Review of Systems:  Review of Systems  Constitutional: Negative for fever, chills, weight loss, malaise/fatigue and diaphoresis.  Eyes: Negative for blurred vision, double vision, photophobia, discharge and redness.  Respiratory: Negative for cough,  hemoptysis, sputum production, shortness of breath and wheezing.   Cardiovascular: Positive for leg swelling. Negative for chest pain, palpitations, orthopnea, claudication and PND.       Mild LLE  Gastrointestinal: Positive for diarrhea. Negative for heartburn, nausea, vomiting, abdominal pain, constipation, blood in stool and melena.       Chronic diarrhea.   Genitourinary: Positive for frequency. Negative for dysuria, urgency, hematuria and flank pain.  Musculoskeletal: Positive for falls and joint pain. Negative for back pain, myalgias and neck pain.       Left knee and new left shoulder  Skin: Negative for itching and rash.       Extensive abrasion, bruise RUE and LLE. RUE warm, tender, swelling, and open areas x2--healing nicely.   Neurological: Negative for dizziness, tingling, tremors, sensory change, speech change, focal weakness, seizures, loss of consciousness and weakness.  Endo/Heme/Allergies: Negative for environmental allergies and polydipsia. Does not bruise/bleed easily.  Psychiatric/Behavioral: Negative for depression, suicidal ideas and memory loss. The patient is nervous/anxious. The patient does not have insomnia.      Past Medical History  Diagnosis Date  . Arthritis   . Cancer     squamous cell  . Incontinence of urine    Past Surgical History  Procedure Laterality Date  . Breast surgery  1981    breast  . Tonsillectomy    . Abdominal hysterectomy    . Tee without cardioversion N/A 01/18/2013    Procedure: TRANSESOPHAGEAL ECHOCARDIOGRAM (TEE);  Surgeon: Madolyn Frieze  Jens Som, MD;  Location: [facility];  Service: Cardiovascular;  Laterality: N/A;   Social History:   reports that she quit smoking about 35 years ago. Her smoking use included Cigarettes. She has a 15 pack-year smoking history. She does not have any smokeless tobacco history on file. Her alcohol and drug histories are not on file.  Family History  Problem Relation Age of Onset  . Arthritis Mother    . Cancer Sister     breast    Medications: Patient's Medications  New Prescriptions   No medications on file  Previous Medications   ACETAMINOPHEN (TYLENOL) 325 MG TABLET    Take 2 tablets (650 mg total) by mouth every 4 (four) hours as needed.   ASCORBIC ACID (VITAMIN C) 100 MG TABLET    Take 100 mg by mouth daily.   ASPIRIN EC 81 MG TABLET    Take 81 mg by mouth daily.   CALCIUM CARBONATE-VITAMIN D (CALCIUM 600 + D PO)    Take 1 tablet by mouth daily.   CYANOCOBALAMIN (VITAMIN B 12 PO)    Take 1 tablet by mouth daily.   ESOMEPRAZOLE (NEXIUM) 40 MG CAPSULE    Take 1 capsule (40 mg total) by mouth daily before breakfast.   IBUPROFEN (ADVIL,MOTRIN) 400 MG TABLET    Take 1 tablet (400 mg total) by mouth 3 (three) times daily with meals as needed.   LOPERAMIDE HCL (IMODIUM PO)    Take 1 each by mouth 3 (three) times daily. Per Dr Lauralyn Primes   MIRTAZAPINE (REMERON) 7.5 MG TABLET    Take 7.5 mg by mouth at bedtime.   MULTIPLE VITAMIN (MULTIVITAMIN) TABLET    Take 1 tablet by mouth daily.   MULTIPLE VITAMINS-MINERALS (OCUVITE PO)    Take 1 tablet by mouth daily.  Modified Medications   No medications on file  Discontinued Medications   No medications on file     Physical Exam: Physical Exam  Constitutional: She is oriented to person, place, and time. She appears well-developed and well-nourished. No distress.  HENT:  Head: Normocephalic and atraumatic.  Right Ear: External ear normal.  Left Ear: External ear normal.  Nose: Nose normal.  Mouth/Throat: Oropharynx is clear and moist. No oropharyngeal exudate.  Eyes: Conjunctivae and EOM are normal. Pupils are equal, round, and reactive to light. Right eye exhibits no discharge. Left eye exhibits no discharge. No scleral icterus.  Neck: Normal range of motion. Neck supple. No JVD present. No tracheal deviation present. No thyromegaly present.  Cardiovascular: Normal rate, regular rhythm, normal heart sounds and intact distal pulses.   No  murmur heard. Pulmonary/Chest: Effort normal and breath sounds normal. No stridor. No respiratory distress. She has no wheezes. She has no rales.  Abdominal: Soft. Bowel sounds are normal. She exhibits no distension. There is no tenderness. There is no rebound and no guarding.  Musculoskeletal: She exhibits edema and tenderness.  The left knee pain. Knee immobilizer the left knee. New left shoulder pain with external and internal rotation.   Lymphadenopathy:    She has no cervical adenopathy.  Neurological: She is alert and oriented to person, place, and time. She displays normal reflexes. No cranial nerve deficit. She exhibits normal muscle tone. Coordination normal.  Skin: No rash noted. She is not diaphoretic. There is erythema. No pallor.  Right right upper arm warmth, tenderness, swelling, open areas x2 R forearm with dry and fresh blood seen. Brownish pigmented BLE  Psychiatric: She has a normal mood and affect.  Her behavior is normal. Judgment and thought content normal.  Talks constantly and changes her subjects frequently.     Filed Vitals:   02/08/13 1209  BP: 152/68  Pulse: 64  Temp: 98.5 F (36.9 C)  TempSrc: Tympanic  Resp: 16      Labs reviewed: Basic Metabolic Panel:  Recent Labs  40/98/11 0330  01/14/13 0630 01/15/13 0605 01/17/13 0509  NA  --   < > 138 136 138  K  --   < > 4.2 4.3 4.3  CL  --   < > 103 102 102  CO2  --   < > 29 24 28   GLUCOSE  --   < > 87 91 93  BUN  --   < > 17 17 21   CREATININE  --   < > 0.73 0.67 0.74  CALCIUM  --   < > 8.7 8.8 8.7  TSH 1.844  --   --   --   --   < > = values in this interval not displayed. Liver Function Tests:  Recent Labs  01/13/13 0155  AST 29  ALT 19  ALKPHOS 63  BILITOT 0.4  PROT 5.8*  ALBUMIN 3.6   CBC:  Recent Labs  02/25/12 1254 01/13/13 0155  01/14/13 0630 01/15/13 0605 01/17/13 0509  WBC 7.5 10.4  < > 7.6 8.6 6.3  NEUTROABS 4.7 8.3*  --   --   --   --   HGB 12.6 12.7  < > 12.0 12.4  11.6*  HCT 38.8 35.8*  < > 35.5* 36.2 33.9*  MCV 93.1 88.6  < > 89.9 89.4 89.0  PLT 235.0 166  < > 157 185 191  < > = values in this interval not displayed. Lipid Panel:  Recent Labs  01/13/13 1130  CHOL 147  HDL 107  LDLCALC 30  TRIG 48  CHOLHDL 1.4    Past Procedures:  01/07/2013 CLINICAL DATA: Headache. TIA. EXAM: CT HEAD WITHOUT CONTRAST IMPRESSION: Atrophy and chronic microvascular ischemic change. No acute abnormality and no change from the prior  01/07/2013 *RADIOLOGY REPORT* Clinical Data: Transient blurred vision, possible transient ischemic attack BILATERAL CAROTID DUPLEX ULTRASOUND IMPRESSION: Trace atherosclerotic plaque results in less than 50% stenoses of the bilateral internal carotid arteries. No significant interval progression compared to 01/16/2011.  01/12/2013 *RADIOLOGY REPORT* Clinical Data: Left knee pain status post fall LEFT TIBIA AND FIBULA -  IMPRESSION: Probable impacted lateral tibial plateau fracture. No evidence of fracture or malalignment in the mid or distal lower leg.    01/12/2013 *RADIOLOGY REPORT* Clinical Data: Fall, knee pain LEFT KNEE - COMPLETE 4+ VIEW IMPRESSION: Agree with preliminary report: probable impacted lateral tibial plateau fracture with associated large suprapatellar joint effusion. Atherosclerotic calcifications in the superficial femoral artery.   01/13/2013 CLINICAL DATA: TIA. Frequent falls recently. Dizziness. EXAM: MRI HEAD WITHOUT CONTRAST MRA HEAD WITHOUT CONTRAST MRI HEAD IMPRESSION 1. Mild generalized atrophy and white matter disease is likely within normal limits for age. 2. No acute intracranial abnormality. MRA HEAD IMPRESSION 1. Normal MRA of the brain for age. 2. No evidence for significant proximal stenosis, aneurysm, or branch vessel occlusion.  01/13/2013 CLINICAL DATA: Syncope. EXAM: PORTABLE CHEST - IMPRESSION: No active disease.   01/13/2013 CLINICAL DATA: Near syncope EXAM: CT HEAD WITHOUT CONTRAST IMPRESSION: 1. No  evidence of acute intracranial disease. 2. Senescent brain changes, age appropriate.   02/05/13   X-ray of the left shoulder: mild to moderate osteopenia, mild osteoarthritis, no acute  fracture, malalignment, or lytic destructive lesion, slight old healed fracture deformity at the lateral left clavicle.   Assessment/Plan Insomnia Able to fall asleep since Iburprofen 400mg  nightly started, but she stated she has trouble maintaining asleep--will try Mirtazapine 7.5mg  nightly.   Left shoulder pain The anterior right shoulder pain with internal and external rotation-mild with slightly decreased AROM. X-ray of the left shoulder: mild to moderate osteopenia, mild osteoarthritis, no acute fracture, malalignment, or lytic destructive lesion, slight old healed fracture deformity at the lateral left clavicle. Will continue Ibuprofen for pain and PT as tolerated. F/u Ortho for the left tibial plateau fx.   GERD (gastroesophageal reflux disease) Stable on PPI    Cellulitis of right arm Swelling, warmth, tenderness, open areas x2--much improved after treated with  Doxycycline 100mg  bid for 7 days    Tibial plateau fracture Dr Luiz Blare provided orthopedic consultation, and recommended conservative management with touch-down weight-bearing and immobilization. Managed with analgesics. Stable. Patient will follow with Dr Luiz Blare       Family/ Staff Communication: observe the patient  Goals of Care: SNF  Labs/tests ordered: left shoulder X-ray done 02/05/13

## 2013-02-08 NOTE — Assessment & Plan Note (Signed)
The anterior right shoulder pain with internal and external rotation-mild with slightly decreased AROM. X-ray of the left shoulder: mild to moderate osteopenia, mild osteoarthritis, no acute fracture, malalignment, or lytic destructive lesion, slight old healed fracture deformity at the lateral left clavicle. Will continue Ibuprofen for pain and PT as tolerated. F/u Ortho for the left tibial plateau fx.

## 2013-02-08 NOTE — Assessment & Plan Note (Signed)
Dr Graves provided orthopedic consultation, and recommended conservative management with touch-down weight-bearing and immobilization. Managed with analgesics. Stable. Patient will follow with Dr Graves  

## 2013-02-08 NOTE — Assessment & Plan Note (Signed)
Stable on PPI 

## 2013-02-08 NOTE — Assessment & Plan Note (Signed)
Swelling, warmth, tenderness, open areas x2--much improved after treated with  Doxycycline 100mg  bid for 7 days

## 2013-02-08 NOTE — Assessment & Plan Note (Signed)
Able to fall asleep since Iburprofen 400mg  nightly started, but she stated she has trouble maintaining asleep--will try Mirtazapine 7.5mg  nightly.

## 2013-02-20 DIAGNOSIS — G459 Transient cerebral ischemic attack, unspecified: Secondary | ICD-10-CM | POA: Diagnosis not present

## 2013-02-20 DIAGNOSIS — M6281 Muscle weakness (generalized): Secondary | ICD-10-CM | POA: Diagnosis not present

## 2013-02-20 DIAGNOSIS — G4700 Insomnia, unspecified: Secondary | ICD-10-CM | POA: Diagnosis not present

## 2013-02-20 DIAGNOSIS — R262 Difficulty in walking, not elsewhere classified: Secondary | ICD-10-CM | POA: Diagnosis not present

## 2013-02-20 DIAGNOSIS — IMO0002 Reserved for concepts with insufficient information to code with codable children: Secondary | ICD-10-CM | POA: Diagnosis not present

## 2013-02-20 DIAGNOSIS — R269 Unspecified abnormalities of gait and mobility: Secondary | ICD-10-CM | POA: Diagnosis not present

## 2013-03-03 DIAGNOSIS — M6281 Muscle weakness (generalized): Secondary | ICD-10-CM | POA: Diagnosis not present

## 2013-03-03 DIAGNOSIS — R262 Difficulty in walking, not elsewhere classified: Secondary | ICD-10-CM | POA: Diagnosis not present

## 2013-03-03 DIAGNOSIS — N3946 Mixed incontinence: Secondary | ICD-10-CM | POA: Diagnosis not present

## 2013-03-05 ENCOUNTER — Encounter: Payer: Self-pay | Admitting: Family Medicine

## 2013-03-08 DIAGNOSIS — N390 Urinary tract infection, site not specified: Secondary | ICD-10-CM | POA: Diagnosis not present

## 2013-03-16 DIAGNOSIS — M25569 Pain in unspecified knee: Secondary | ICD-10-CM | POA: Diagnosis not present

## 2013-03-17 DIAGNOSIS — M6281 Muscle weakness (generalized): Secondary | ICD-10-CM | POA: Diagnosis not present

## 2013-03-17 DIAGNOSIS — R262 Difficulty in walking, not elsewhere classified: Secondary | ICD-10-CM | POA: Diagnosis not present

## 2013-03-17 DIAGNOSIS — N3946 Mixed incontinence: Secondary | ICD-10-CM | POA: Diagnosis not present

## 2013-04-06 DIAGNOSIS — M25569 Pain in unspecified knee: Secondary | ICD-10-CM | POA: Diagnosis not present

## 2013-04-27 DIAGNOSIS — M1710 Unilateral primary osteoarthritis, unspecified knee: Secondary | ICD-10-CM | POA: Diagnosis not present

## 2013-04-27 DIAGNOSIS — M25469 Effusion, unspecified knee: Secondary | ICD-10-CM | POA: Diagnosis not present

## 2013-05-06 DIAGNOSIS — H5200 Hypermetropia, unspecified eye: Secondary | ICD-10-CM | POA: Diagnosis not present

## 2013-05-06 DIAGNOSIS — H524 Presbyopia: Secondary | ICD-10-CM | POA: Diagnosis not present

## 2013-05-06 DIAGNOSIS — H52209 Unspecified astigmatism, unspecified eye: Secondary | ICD-10-CM | POA: Diagnosis not present

## 2013-05-06 DIAGNOSIS — H353190 Nonexudative age-related macular degeneration, unspecified eye, stage unspecified: Secondary | ICD-10-CM | POA: Diagnosis not present

## 2013-05-06 DIAGNOSIS — Z961 Presence of intraocular lens: Secondary | ICD-10-CM | POA: Diagnosis not present

## 2013-05-27 DIAGNOSIS — M1710 Unilateral primary osteoarthritis, unspecified knee: Secondary | ICD-10-CM | POA: Diagnosis not present

## 2013-05-27 DIAGNOSIS — M25469 Effusion, unspecified knee: Secondary | ICD-10-CM | POA: Diagnosis not present

## 2013-06-21 ENCOUNTER — Other Ambulatory Visit: Payer: Self-pay | Admitting: Internal Medicine

## 2013-06-21 DIAGNOSIS — Z1231 Encounter for screening mammogram for malignant neoplasm of breast: Secondary | ICD-10-CM

## 2013-06-30 ENCOUNTER — Ambulatory Visit (HOSPITAL_COMMUNITY)
Admission: RE | Admit: 2013-06-30 | Discharge: 2013-06-30 | Disposition: A | Discharge status: Home / Self Care | Payer: Medicare Other | Source: Ambulatory Visit | Attending: Internal Medicine | Admitting: Internal Medicine

## 2013-06-30 DIAGNOSIS — Z1231 Encounter for screening mammogram for malignant neoplasm of breast: Secondary | ICD-10-CM | POA: Diagnosis not present

## 2013-07-08 DIAGNOSIS — M1710 Unilateral primary osteoarthritis, unspecified knee: Secondary | ICD-10-CM | POA: Diagnosis not present

## 2013-09-27 DIAGNOSIS — H353190 Nonexudative age-related macular degeneration, unspecified eye, stage unspecified: Secondary | ICD-10-CM | POA: Diagnosis not present

## 2013-10-11 DIAGNOSIS — L723 Sebaceous cyst: Secondary | ICD-10-CM | POA: Diagnosis not present

## 2013-10-11 DIAGNOSIS — D485 Neoplasm of uncertain behavior of skin: Secondary | ICD-10-CM | POA: Diagnosis not present

## 2013-10-11 DIAGNOSIS — L819 Disorder of pigmentation, unspecified: Secondary | ICD-10-CM | POA: Diagnosis not present

## 2013-10-11 DIAGNOSIS — L821 Other seborrheic keratosis: Secondary | ICD-10-CM | POA: Diagnosis not present

## 2013-10-11 DIAGNOSIS — Z85828 Personal history of other malignant neoplasm of skin: Secondary | ICD-10-CM | POA: Diagnosis not present

## 2013-10-11 DIAGNOSIS — L570 Actinic keratosis: Secondary | ICD-10-CM | POA: Diagnosis not present

## 2013-10-19 DIAGNOSIS — M6281 Muscle weakness (generalized): Secondary | ICD-10-CM | POA: Diagnosis not present

## 2013-10-25 DIAGNOSIS — M6281 Muscle weakness (generalized): Secondary | ICD-10-CM | POA: Diagnosis not present

## 2013-10-27 DIAGNOSIS — M6281 Muscle weakness (generalized): Secondary | ICD-10-CM | POA: Diagnosis not present

## 2013-11-03 DIAGNOSIS — M6281 Muscle weakness (generalized): Secondary | ICD-10-CM | POA: Diagnosis not present

## 2013-11-04 DIAGNOSIS — M25519 Pain in unspecified shoulder: Secondary | ICD-10-CM | POA: Diagnosis not present

## 2013-11-04 DIAGNOSIS — M6281 Muscle weakness (generalized): Secondary | ICD-10-CM | POA: Diagnosis not present

## 2013-11-04 DIAGNOSIS — M1710 Unilateral primary osteoarthritis, unspecified knee: Secondary | ICD-10-CM | POA: Diagnosis not present

## 2013-11-04 DIAGNOSIS — M66329 Spontaneous rupture of flexor tendons, unspecified upper arm: Secondary | ICD-10-CM | POA: Diagnosis not present

## 2013-11-11 DIAGNOSIS — M6281 Muscle weakness (generalized): Secondary | ICD-10-CM | POA: Diagnosis not present

## 2013-11-16 DIAGNOSIS — M6281 Muscle weakness (generalized): Secondary | ICD-10-CM | POA: Diagnosis not present

## 2013-11-18 DIAGNOSIS — M6281 Muscle weakness (generalized): Secondary | ICD-10-CM | POA: Diagnosis not present

## 2013-11-23 DIAGNOSIS — M6281 Muscle weakness (generalized): Secondary | ICD-10-CM | POA: Diagnosis not present

## 2013-11-25 DIAGNOSIS — M6281 Muscle weakness (generalized): Secondary | ICD-10-CM | POA: Diagnosis not present

## 2013-11-30 DIAGNOSIS — M6281 Muscle weakness (generalized): Secondary | ICD-10-CM | POA: Diagnosis not present

## 2013-12-02 DIAGNOSIS — M6281 Muscle weakness (generalized): Secondary | ICD-10-CM | POA: Diagnosis not present

## 2013-12-07 DIAGNOSIS — M6281 Muscle weakness (generalized): Secondary | ICD-10-CM | POA: Diagnosis not present

## 2014-01-05 ENCOUNTER — Ambulatory Visit: Payer: Medicare Other | Admitting: Family Medicine

## 2014-01-20 ENCOUNTER — Encounter: Payer: Self-pay | Admitting: Internal Medicine

## 2014-01-20 ENCOUNTER — Non-Acute Institutional Stay: Payer: Medicare Other | Admitting: Internal Medicine

## 2014-01-20 VITALS — BP 120/58 | HR 84 | Wt 99.0 lb

## 2014-01-20 DIAGNOSIS — H9193 Unspecified hearing loss, bilateral: Secondary | ICD-10-CM

## 2014-01-20 DIAGNOSIS — H9190 Unspecified hearing loss, unspecified ear: Secondary | ICD-10-CM | POA: Insufficient documentation

## 2014-01-20 DIAGNOSIS — S82142S Displaced bicondylar fracture of left tibia, sequela: Secondary | ICD-10-CM | POA: Diagnosis not present

## 2014-01-20 DIAGNOSIS — M19041 Primary osteoarthritis, right hand: Secondary | ICD-10-CM | POA: Diagnosis not present

## 2014-01-20 DIAGNOSIS — M19042 Primary osteoarthritis, left hand: Secondary | ICD-10-CM

## 2014-01-20 NOTE — Progress Notes (Signed)
Patient ID: [name], female   DOB: 1922-10-17, 80 y.o.   MRN: [id number]    [facility]   [facility] of Service[facility] (12[facility]     Allergies  Allergen Reactions  . Penicillin G Itching  . Septra [Bactrim]     Stomach concerns.     Chief Complaint  Patient presents with  . Medical Management of Chronic Issues    trouble hearing in both ears. Needs referral for hearing aids    HPI:  Hearing loss, bilateral:  Bilateral hearing aid for 10 years. Hearing is getting worse and dshe wants to see audiology again.Has been to [facility] in the past.  Tibial plateau fracture, left, sequela: residual discomfort in the left knee. Able to walk OK. Does not have to wear brace.  Primary osteoarthritis of both hands: deformities of the fingers and hands related to destructive OA.    Medications: Patient's Medications  New Prescriptions   No medications on file  Previous Medications   ASCORBIC ACID (VITAMIN C) 100 MG TABLET    Take 100 mg by mouth daily.   ASPIRIN EC 81 MG TABLET    Take 81 mg by mouth. Take one three times a week   CALCIUM CARBONATE-VITAMIN D (CALCIUM 600 + D PO)    Take 1 tablet by mouth daily.   CYANOCOBALAMIN (VITAMIN B 12 PO)    Take 1 tablet by mouth daily.   IBUPROFEN (ADVIL,MOTRIN) 400 MG TABLET    Take 1 tablet (400 mg total) by mouth 3 (three) times daily with meals as needed.   LOPERAMIDE HCL (IMODIUM PO)    Take 1 each by mouth 3 (three) times daily. Per Dr Rudene Re   MULTIPLE VITAMINS-MINERALS (OCUVITE PO)    Take 1 tablet by mouth daily.  Modified Medications   No medications on file  Discontinued Medications   ACETAMINOPHEN (TYLENOL) 325 MG TABLET    Take 2 tablets (650 mg total) by mouth every 4 (four) hours as needed.   ESOMEPRAZOLE (NEXIUM) 40 MG CAPSULE    Take 1 capsule (40 mg total) by mouth daily before breakfast.   MIRTAZAPINE (REMERON) 7.5 MG TABLET    Take 7.5 mg by mouth at bedtime.   MULTIPLE VITAMIN (MULTIVITAMIN)  TABLET    Take 1 tablet by mouth daily.     Review of Systems  Constitutional: Negative.   HENT: Positive for hearing loss and voice change (hoarse). Negative for sore throat.   Eyes: Negative.   Respiratory: Negative for choking and chest tightness.   Cardiovascular: Negative for chest pain, palpitations and leg swelling.  Gastrointestinal: Positive for diarrhea.  Endocrine: Negative.   Genitourinary:       Incontinent of urine.  Musculoskeletal:       Left knee pain. Hx of tibial plateau fracture. Osteoarthritis of the fingers.  Skin: Negative.   Neurological: Negative for tremors, seizures, weakness, numbness and headaches.  Hematological: Negative.   Psychiatric/Behavioral: Negative for behavioral problems, confusion and dysphoric mood. The patient is not nervous/anxious.     Filed Vitals:   01/20/14 1304  BP: 120/58  Pulse: 84  Weight: 99 lb (44.906 kg)   Body mass index is 18.72 kg/(m^2).  Physical Exam  Constitutional: She is oriented to person, place, and time. She appears well-developed and well-nourished. No distress.  HENT:  Head: Normocephalic and atraumatic.  Right Ear: External ear normal.  Left Ear: External ear normal.  Nose: Nose normal.  Mouth/Throat: Oropharynx is clear and  moist. No oropharyngeal exudate.  Bilateral hearing aids. Profound loss of hearing.  Eyes: Conjunctivae and EOM are normal. Pupils are equal, round, and reactive to light. Right eye exhibits no discharge. Left eye exhibits no discharge. No scleral icterus.  Neck: Normal range of motion. Neck supple. No JVD present. No tracheal deviation present. No thyromegaly present.  Cardiovascular: Normal rate, regular rhythm, normal heart sounds and intact distal pulses.   No murmur heard. Pulmonary/Chest: Effort normal and breath sounds normal. No stridor. No respiratory distress. She has no wheezes. She has no rales.  Abdominal: Soft. Bowel sounds are normal. She exhibits no distension. There  is no tenderness. There is no rebound and no guarding.  Musculoskeletal: She exhibits edema and tenderness.  Left knee pain.  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  Lymphadenopathy:    She has no cervical adenopathy.  Neurological: She is alert and oriented to person, place, and time. She displays normal reflexes. No cranial nerve deficit. She exhibits normal muscle tone. Coordination normal.  Skin: No rash noted. She is not diaphoretic. There is erythema. No pallor.  Brownish pigmented BLE  Psychiatric: She has a normal mood and affect. Her behavior is normal. Judgment and thought content normal.  Talks constantly and changes her subjects frequently.      Labs reviewed: No visits with results within 3 Month(s) from this visit. Latest known visit with results is:  Admission on 01/12/2013, Discharged on 01/18/2013  Component Date Value Ref Range Status  . WBC 01/13/2013 10.4  4.0 - 10.5 K/uL Final  . RBC 01/13/2013 4.04  3.87 - 5.11 MIL/uL Final  . Hemoglobin 01/13/2013 12.7  12.0 - 15.0 g/dL Final  . HCT 01/13/2013 35.8* 36.0 - 46.0 % Final  . MCV 01/13/2013 88.6  78.0 - 100.0 fL Final  . MCH 01/13/2013 31.4  26.0 - 34.0 pg Final  . MCHC 01/13/2013 35.5  30.0 - 36.0 g/dL Final  . RDW 01/13/2013 13.9  11.5 - 15.5 % Final  . Platelets 01/13/2013 166  150 - 400 K/uL Final  . Neutrophils Relative % 01/13/2013 80* 43 - 77 % Final  . Neutro Abs 01/13/2013 8.3* 1.7 - 7.7 K/uL Final  . Lymphocytes Relative 01/13/2013 14  12 - 46 % Final  . Lymphs Abs 01/13/2013 1.4  0.7 - 4.0 K/uL Final  . Monocytes Relative 01/13/2013 7  3 - 12 % Final  . Monocytes Absolute 01/13/2013 0.7  0.1 - 1.0 K/uL Final  . Eosinophils Relative 01/13/2013 0  0 - 5 % Final  . Eosinophils Absolute 01/13/2013 0.0  0.0 - 0.7 K/uL Final  . Basophils Relative 01/13/2013 0  0 - 1 % Final  . Basophils Absolute 01/13/2013 0.0  0.0 - 0.1 K/uL Final  . Sodium 01/13/2013 140  135 - 145 mEq/L Final    . Potassium 01/13/2013 4.5  3.5 - 5.1 mEq/L Final  . Chloride 01/13/2013 103  96 - 112 mEq/L Final  . CO2 01/13/2013 26  19 - 32 mEq/L Final  . Glucose, Bld 01/13/2013 115* 70 - 99 mg/dL Final  . BUN 01/13/2013 21  6 - 23 mg/dL Final  . Creatinine, Ser 01/13/2013 0.62  0.50 - 1.10 mg/dL Final  . Calcium 01/13/2013 8.6  8.4 - 10.5 mg/dL Final  . Total Protein 01/13/2013 5.8* 6.0 - 8.3 g/dL Final  . Albumin 01/13/2013 3.6  3.5 - 5.2 g/dL Final  . AST 01/13/2013 29  0 - 37 U/L Final  .  ALT 01/13/2013 19  0 - 35 U/L Final  . Alkaline Phosphatase 01/13/2013 63  39 - 117 U/L Final  . Total Bilirubin 01/13/2013 0.4  0.3 - 1.2 mg/dL Final  . GFR calc non Af Amer 01/13/2013 78* >90 mL/min Final  . GFR calc Af Amer 01/13/2013 >90  >90 mL/min Final   Comment: (NOTE)                          The eGFR has been calculated using the CKD EPI equation.                          This calculation has not been validated in all clinical situations.                          eGFR's persistently <90 mL/min signify possible Chronic Kidney                          Disease.  . Troponin I 01/13/2013 <0.30  <0.30 ng/mL Final   Comment:                                 Due to the release kinetics of cTnI,                          a negative result within the first hours                          of the onset of symptoms does not rule out                          myocardial infarction with certainty.                          If myocardial infarction is still suspected,                          repeat the test at appropriate intervals.  Marland Kitchen TSH 01/13/2013 1.844  0.350 - 4.500 uIU/mL Final   Performed at [facility]  . Hemoglobin A1C 01/13/2013 6.3* <5.7 % Final   Comment: (NOTE)                                                                                                                         According to the ADA Clinical Practice Recommendations for 2011, when                          HbA1c is used as a  screening test:                           >=  6.5%   Diagnostic of Diabetes Mellitus                                    (if abnormal result is confirmed)                          5.7-6.4%   Increased risk of developing Diabetes Mellitus                          References:Diagnosis and Classification of Diabetes Mellitus,Diabetes                          BWLS,9373,42(AJGOT 1):S62-S69 and Standards of Medical Care in                                  Diabetes - 2011,Diabetes Care,2011,34 (Suppl 1):S11-S61.  . Mean Plasma Glucose 01/13/2013 134* <117 mg/dL Final   Performed at [facility]  . Cholesterol 01/13/2013 147  0 - 200 mg/dL Final  . Triglycerides 01/13/2013 48  <150 mg/dL Final  . HDL 01/13/2013 107  >39 mg/dL Final  . Total CHOL/HDL Ratio 01/13/2013 1.4   Final  . VLDL 01/13/2013 10  0 - 40 mg/dL Final  . LDL Cholesterol 01/13/2013 30  0 - 99 mg/dL Final   Comment:                                 Total Cholesterol/HDL:CHD Risk                          Coronary Heart Disease Risk Table                                              Men   Women                           1/2 Average Risk   3.4   3.3                           Average Risk       5.0   4.4                           2 X Average Risk   9.6   7.1                           3 X Average Risk  23.4   11.0                                                          Use the calculated Patient Ratio  above and the CHD Risk Table                          to determine the patient's CHD Risk.                                                          ATP III CLASSIFICATION (LDL):                           <100     mg/dL   Optimal                           100-129  mg/dL   Near or Above                                             Optimal                           130-159  mg/dL   Borderline                           160-189  mg/dL   High                           >190     mg/dL   Very High  . WBC  01/13/2013 9.0  4.0 - 10.5 K/uL Final  . RBC 01/13/2013 4.09  3.87 - 5.11 MIL/uL Final  . Hemoglobin 01/13/2013 12.3  12.0 - 15.0 g/dL Final  . HCT 01/13/2013 36.1  36.0 - 46.0 % Final  . MCV 01/13/2013 88.3  78.0 - 100.0 fL Final  . MCH 01/13/2013 30.1  26.0 - 34.0 pg Final  . MCHC 01/13/2013 34.1  30.0 - 36.0 g/dL Final  . RDW 01/13/2013 14.0  11.5 - 15.5 % Final  . Platelets 01/13/2013 171  150 - 400 K/uL Final  . Sodium 01/13/2013 135  135 - 145 mEq/L Final  . Potassium 01/13/2013 4.4  3.5 - 5.1 mEq/L Final  . Chloride 01/13/2013 100  96 - 112 mEq/L Final  . CO2 01/13/2013 25  19 - 32 mEq/L Final  . Glucose, Bld 01/13/2013 100* 70 - 99 mg/dL Final  . BUN 01/13/2013 18  6 - 23 mg/dL Final  . Creatinine, Ser 01/13/2013 0.62  0.50 - 1.10 mg/dL Final  . Calcium 01/13/2013 8.6  8.4 - 10.5 mg/dL Final  . GFR calc non Af Amer 01/13/2013 78* >90 mL/min Final  . GFR calc Af Amer 01/13/2013 >90  >90 mL/min Final   Comment: (NOTE)                          The eGFR has been calculated using the CKD EPI equation.                          This calculation has not been validated in all clinical situations.  eGFR's persistently <90 mL/min signify possible Chronic Kidney                          Disease.  . Glucose-Capillary 01/13/2013 131* 70 - 99 mg/dL Final  . WBC 01/14/2013 7.6  4.0 - 10.5 K/uL Final  . RBC 01/14/2013 3.95  3.87 - 5.11 MIL/uL Final  . Hemoglobin 01/14/2013 12.0  12.0 - 15.0 g/dL Final  . HCT 01/14/2013 35.5* 36.0 - 46.0 % Final  . MCV 01/14/2013 89.9  78.0 - 100.0 fL Final  . MCH 01/14/2013 30.4  26.0 - 34.0 pg Final  . MCHC 01/14/2013 33.8  30.0 - 36.0 g/dL Final  . RDW 01/14/2013 14.3  11.5 - 15.5 % Final  . Platelets 01/14/2013 157  150 - 400 K/uL Final  . Sodium 01/14/2013 138  135 - 145 mEq/L Final  . Potassium 01/14/2013 4.2  3.5 - 5.1 mEq/L Final  . Chloride 01/14/2013 103  96 - 112 mEq/L Final  . CO2 01/14/2013 29  19 - 32 mEq/L Final  .  Glucose, Bld 01/14/2013 87  70 - 99 mg/dL Final  . BUN 01/14/2013 17  6 - 23 mg/dL Final  . Creatinine, Ser 01/14/2013 0.73  0.50 - 1.10 mg/dL Final  . Calcium 01/14/2013 8.7  8.4 - 10.5 mg/dL Final  . GFR calc non Af Amer 01/14/2013 74* >90 mL/min Final  . GFR calc Af Amer 01/14/2013 86* >90 mL/min Final   Comment: (NOTE)                          The eGFR has been calculated using the CKD EPI equation.                          This calculation has not been validated in all clinical situations.                          eGFR's persistently <90 mL/min signify possible Chronic Kidney                          Disease.  . Glucose-Capillary 01/13/2013 96  70 - 99 mg/dL Final  . Comment 1 01/13/2013 Documented in Chart   Final  . Glucose-Capillary 01/13/2013 93  70 - 99 mg/dL Final  . Glucose-Capillary 01/14/2013 85  70 - 99 mg/dL Final  . Comment 1 01/14/2013 Documented in Chart   Final  . Comment 2 01/14/2013 Notify RN   Final  . Glucose-Capillary 01/14/2013 80  70 - 99 mg/dL Final  . WBC 01/15/2013 8.6  4.0 - 10.5 K/uL Final  . RBC 01/15/2013 4.05  3.87 - 5.11 MIL/uL Final  . Hemoglobin 01/15/2013 12.4  12.0 - 15.0 g/dL Final  . HCT 01/15/2013 36.2  36.0 - 46.0 % Final  . MCV 01/15/2013 89.4  78.0 - 100.0 fL Final  . MCH 01/15/2013 30.6  26.0 - 34.0 pg Final  . MCHC 01/15/2013 34.3  30.0 - 36.0 g/dL Final  . RDW 01/15/2013 14.1  11.5 - 15.5 % Final  . Platelets 01/15/2013 185  150 - 400 K/uL Final  . Sodium 01/15/2013 136  135 - 145 mEq/L Final  . Potassium 01/15/2013 4.3  3.5 - 5.1 mEq/L Final  . Chloride 01/15/2013 102  96 - 112 mEq/L Final  . CO2  01/15/2013 24  19 - 32 mEq/L Final  . Glucose, Bld 01/15/2013 91  70 - 99 mg/dL Final  . BUN 01/15/2013 17  6 - 23 mg/dL Final  . Creatinine, Ser 01/15/2013 0.67  0.50 - 1.10 mg/dL Final  . Calcium 01/15/2013 8.8  8.4 - 10.5 mg/dL Final  . GFR calc non Af Amer 01/15/2013 76* >90 mL/min Final  . GFR calc Af Amer 01/15/2013 88* >90 mL/min  Final   Comment: (NOTE)                          The eGFR has been calculated using the CKD EPI equation.                          This calculation has not been validated in all clinical situations.                          eGFR's persistently <90 mL/min signify possible Chronic Kidney                          Disease.  . WBC 01/17/2013 6.3  4.0 - 10.5 K/uL Final  . RBC 01/17/2013 3.81* 3.87 - 5.11 MIL/uL Final  . Hemoglobin 01/17/2013 11.6* 12.0 - 15.0 g/dL Final  . HCT 01/17/2013 33.9* 36.0 - 46.0 % Final  . MCV 01/17/2013 89.0  78.0 - 100.0 fL Final  . MCH 01/17/2013 30.4  26.0 - 34.0 pg Final  . MCHC 01/17/2013 34.2  30.0 - 36.0 g/dL Final  . RDW 01/17/2013 14.0  11.5 - 15.5 % Final  . Platelets 01/17/2013 191  150 - 400 K/uL Final  . Sodium 01/17/2013 138  135 - 145 mEq/L Final  . Potassium 01/17/2013 4.3  3.5 - 5.1 mEq/L Final  . Chloride 01/17/2013 102  96 - 112 mEq/L Final  . CO2 01/17/2013 28  19 - 32 mEq/L Final  . Glucose, Bld 01/17/2013 93  70 - 99 mg/dL Final  . BUN 01/17/2013 21  6 - 23 mg/dL Final  . Creatinine, Ser 01/17/2013 0.74  0.50 - 1.10 mg/dL Final  . Calcium 01/17/2013 8.7  8.4 - 10.5 mg/dL Final  . GFR calc non Af Amer 01/17/2013 74* >90 mL/min Final  . GFR calc Af Amer 01/17/2013 85* >90 mL/min Final   Comment: (NOTE)                          The eGFR has been calculated using the CKD EPI equation.                          This calculation has not been validated in all clinical situations.                          eGFR's persistently <90 mL/min signify possible Chronic Kidney                          Disease.     Assessment/Plan 1. Hearing loss, bilateral -refer to [facility]  2. Tibial plateau fracture, left, sequela residual pain  3. Primary osteoarthritis of both hands observe

## 2014-01-27 ENCOUNTER — Encounter: Payer: Medicare Other | Admitting: Nurse Practitioner

## 2014-01-31 DIAGNOSIS — H903 Sensorineural hearing loss, bilateral: Secondary | ICD-10-CM | POA: Diagnosis not present

## 2014-02-07 DIAGNOSIS — Z23 Encounter for immunization: Secondary | ICD-10-CM | POA: Diagnosis not present

## 2014-02-18 ENCOUNTER — Encounter: Payer: Self-pay | Admitting: Internal Medicine

## 2014-03-09 ENCOUNTER — Ambulatory Visit (INDEPENDENT_AMBULATORY_CARE_PROVIDER_SITE_OTHER): Payer: Medicare Other | Admitting: Emergency Medicine

## 2014-03-09 ENCOUNTER — Ambulatory Visit (INDEPENDENT_AMBULATORY_CARE_PROVIDER_SITE_OTHER): Payer: Medicare Other

## 2014-03-09 VITALS — BP 122/76 | HR 80 | Temp 98.0°F | Resp 17 | Ht 61.0 in | Wt 98.0 lb

## 2014-03-09 DIAGNOSIS — R059 Cough, unspecified: Secondary | ICD-10-CM

## 2014-03-09 DIAGNOSIS — R938 Abnormal findings on diagnostic imaging of other specified body structures: Secondary | ICD-10-CM

## 2014-03-09 DIAGNOSIS — J988 Other specified respiratory disorders: Secondary | ICD-10-CM

## 2014-03-09 DIAGNOSIS — R9389 Abnormal findings on diagnostic imaging of other specified body structures: Secondary | ICD-10-CM

## 2014-03-09 DIAGNOSIS — R05 Cough: Secondary | ICD-10-CM

## 2014-03-09 DIAGNOSIS — R5081 Fever presenting with conditions classified elsewhere: Secondary | ICD-10-CM

## 2014-03-09 LAB — POCT CBC
Granulocyte percent: 78.3 %G (ref 37–80)
HCT, POC: 44.4 % (ref 37.7–47.9)
Hemoglobin: 14.6 g/dL (ref 12.2–16.2)
Lymph, poc: 2.1 (ref 0.6–3.4)
MCH, POC: 30.5 pg (ref 27–31.2)
MCHC: 33 g/dL (ref 31.8–35.4)
MCV: 92.5 fL (ref 80–97)
MID (CBC): 0.2 (ref 0–0.9)
MPV: 6.8 fL (ref 0–99.8)
PLATELET COUNT, POC: 238 10*3/uL (ref 142–424)
POC Granulocyte: 8.4 — AB (ref 2–6.9)
POC LYMPH %: 20 % (ref 10–50)
POC MID %: 1.7 %M (ref 0–12)
RBC: 4.81 M/uL (ref 4.04–5.48)
RDW, POC: 13.8 %
WBC: 10.7 10*3/uL — AB (ref 4.6–10.2)

## 2014-03-09 MED ORDER — AZITHROMYCIN 250 MG PO TABS: ORAL_TABLET | ORAL | Status: DC

## 2014-03-09 NOTE — Progress Notes (Signed)
Subjective:  This chart was scribed for Darlyne Russian, MD by Ladene Artist, ED Scribe. The patient was seen in room 13. Patient's care was started at 12:41 PM.   Patient ID: [name], female    DOB: [birth year], 80 y.o.   MRN: [id number]  Chief Complaint  Patient presents with  . Allergies  . Hearing Problem   HPI HPI Comments: [name] is a 80 y.o. female who presents to the [facility] complaining of gradually worsening postnasal drip onset 5 days ago. Pt reports associated cough with white/grey colored sputum and fever. Tmax 101 F, Triage temperature 98 F. Pt has tried Benadryl and ibuprofen with mild relief. Allergies to PCN and Septra.  Immunizations Pt has had already had her flu vaccine.  Pt is a resident at [facility]. She was referred here from a nurse there.   Past Medical History  Diagnosis Date  . Arthritis   . Cancer     squamous cell  . Incontinence of urine   . Allergy   . Oxygen deficiency    Current Outpatient Prescriptions on File Prior to Visit  Medication Sig Dispense Refill  . Ascorbic Acid (VITAMIN C) 100 MG tablet Take 100 mg by mouth daily.    Marland Kitchen aspirin EC 81 MG tablet Take 81 mg by mouth. Take one three times a week    . Calcium Carbonate-Vitamin D (CALCIUM 600 + D PO) Take 1 tablet by mouth daily.     No current facility-administered medications on file prior to visit.   Allergies  Allergen Reactions  . Penicillin G Itching  . Septra [Bactrim]     Stomach concerns.    Review of Systems  Constitutional: Positive for fever. Negative for chills.  HENT: Positive for postnasal drip.   Eyes: Negative for visual disturbance.  Respiratory: Positive for cough.   Cardiovascular: Negative for chest pain.  Neurological: Negative for dizziness, syncope and light-headedness.      Objective:   Physical Exam CONSTITUTIONAL: Well developed/well nourished HEAD: Normocephalic/atraumatic EYES:  EOMI/PERRL ENMT: Mucous membranes moist, hearing aid in R ear NECK: supple no meningeal signs SPINE/BACK:entire spine nontender CV: S1/S2 noted, no murmurs/rubs/gallops noted LUNGS: No apparent distress, few rhonchi on the R ABDOMEN: soft, nontender, no rebound or guarding, bowel sounds noted throughout abdomen GU:no cva tenderness NEURO: Pt is awake/alert/appropriate, moves all extremitiesx4.  No facial droop.   EXTREMITIES: pulses normal/equal, full ROM SKIN: warm, color normal PSYCH: no abnormalities of mood noted, alert and oriented to situation    Results for orders placed or performed in visit on 03/09/14  POCT CBC  Result Value Ref Range   WBC 10.7 (A) 4.6 - 10.2 K/uL   Lymph, poc 2.1 0.6 - 3.4   POC LYMPH PERCENT 20.0 10 - 50 %L   MID (cbc) 0.2 0 - 0.9   POC MID % 1.7 0 - 12 %M   POC Granulocyte 8.4 (A) 2 - 6.9   Granulocyte percent 78.3 37 - 80 %G   RBC 4.81 4.04 - 5.48 M/uL   Hemoglobin 14.6 12.2 - 16.2 g/dL   HCT, POC 44.4 37.7 - 47.9 %   MCV 92.5 80 - 97 fL   MCH, POC 30.5 27 - 31.2 pg   MCHC 33.0 31.8 - 35.4 g/dL   RDW, POC 13.8 %   Platelet Count, POC 238 142 - 424 K/uL   MPV 6.8 0 - 99.8 fL   [facility] reading (  PRIMARY) by  Dr.Daubthere are mild increased markings seen on the PA on the left base. On the right side there is a 1/2 cm somewhat spiculated area lateral right lung please comment  Assessment & Plan:  We'll treat with Z-Pak and Mucinex. She will need a follow-up film in 2 weeks we'll discuss with her whether she wants to have a CT done now or recheck chest x-ray in 2 weeks to assure clearing. I discussed this with the patient should. She was to go ahead and have her scan done now. I personally performed the services described in this documentation, which was scribed in my presence. The recorded information has been reviewed and is accurate.

## 2014-03-09 NOTE — Patient Instructions (Signed)

## 2014-03-15 ENCOUNTER — Other Ambulatory Visit: Payer: Medicare Other

## 2014-03-15 ENCOUNTER — Ambulatory Visit
Admission: RE | Admit: 2014-03-15 | Discharge: 2014-03-15 | Disposition: A | Discharge status: Home / Self Care | Payer: Medicare Other | Source: Ambulatory Visit | Attending: Emergency Medicine | Admitting: Emergency Medicine

## 2014-03-15 DIAGNOSIS — J984 Other disorders of lung: Secondary | ICD-10-CM | POA: Diagnosis not present

## 2014-03-15 DIAGNOSIS — R9389 Abnormal findings on diagnostic imaging of other specified body structures: Secondary | ICD-10-CM

## 2014-03-16 ENCOUNTER — Other Ambulatory Visit: Payer: Self-pay | Admitting: Emergency Medicine

## 2014-03-16 ENCOUNTER — Telehealth: Payer: Self-pay | Admitting: *Deleted

## 2014-03-16 DIAGNOSIS — R918 Other nonspecific abnormal finding of lung field: Secondary | ICD-10-CM

## 2014-03-16 NOTE — Telephone Encounter (Signed)
I informed Dr. Everlene Farrier she called back. He went ahead and called her back. He was able to get a hold of her and speak to her.

## 2014-03-16 NOTE — Telephone Encounter (Signed)
Message for Dr. Everlene Farrier,  Patient was returning his phone call about her CT scan.     Pt can be reached at [phone]

## 2014-03-24 ENCOUNTER — Non-Acute Institutional Stay: Payer: Medicare Other | Admitting: Nurse Practitioner

## 2014-03-24 VITALS — BP 100/58 | HR 60 | Temp 98.4°F | Ht 61.0 in | Wt 98.0 lb

## 2014-03-24 DIAGNOSIS — K219 Gastro-esophageal reflux disease without esophagitis: Secondary | ICD-10-CM

## 2014-03-24 DIAGNOSIS — N393 Stress incontinence (female) (male): Secondary | ICD-10-CM

## 2014-03-24 DIAGNOSIS — G4700 Insomnia, unspecified: Secondary | ICD-10-CM | POA: Diagnosis not present

## 2014-03-24 DIAGNOSIS — H9190 Unspecified hearing loss, unspecified ear: Secondary | ICD-10-CM

## 2014-03-24 DIAGNOSIS — G459 Transient cerebral ischemic attack, unspecified: Secondary | ICD-10-CM | POA: Diagnosis not present

## 2014-03-24 NOTE — Assessment & Plan Note (Signed)
Hearing aids 

## 2014-03-24 NOTE — Progress Notes (Signed)
Patient ID: [name], female   DOB: 07/16/1922, 80 y.o.   MRN: [id number]    Code Status: DNR  Allergies  Allergen Reactions  . Penicillin G Itching  . Septra [Bactrim]     Stomach concerns.     Chief Complaint  Patient presents with  . Medical Management of Chronic Issues    New Patient-switching doctors, wants a doctor here at [place]   HPI: Patient is a 80 y.o. female seen in the clinic at [facility] today for evaluation of chronic medical conditions.   Problem List Items Addressed This Visit    Urine, incontinence, stress female    Leakage, adult pads for urinary incontinent care.     TIA (transient ischemic attack)    01/12/13: recommended Aggrenox-patient only took 2-3x ASA 81mg /week. No new s/s TIA in the past year. Will recommend ASA 81mg  daily.     Insomnia - Primary    Sleeps well, off Mirtazapine 7.5mg  nightly.     Hearing loss    Hearing aids    GERD (gastroesophageal reflux disease)    Stable off PPI       Review of Systems:  Review of Systems  Constitutional: Negative for fever, chills, weight loss, malaise/fatigue and diaphoresis.  Eyes: Negative for blurred vision, double vision, photophobia, discharge and redness.  Respiratory: Negative for cough, hemoptysis, sputum production, shortness of breath and wheezing.   Cardiovascular: Negative for chest pain, palpitations, orthopnea, claudication, leg swelling and PND.  Gastrointestinal: Positive for diarrhea. Negative for heartburn, nausea, vomiting, abdominal pain, constipation, blood in stool and melena.       Chronic diarrhea.   Genitourinary: Positive for frequency. Negative for dysuria, urgency, hematuria and flank pain.       2-3x/night  Musculoskeletal: Negative for myalgias, back pain, joint pain, falls and neck pain.       Multiple sites but no reduced ROM   Skin: Negative for itching and rash.       Left breast biopsy and the right 5th toe s/p amputation.     Neurological: Negative for dizziness, tingling, tremors, sensory change, speech change, focal weakness, seizures, loss of consciousness and weakness.  Endo/Heme/Allergies: Negative for environmental allergies and polydipsia. Does not bruise/bleed easily.  Psychiatric/Behavioral: Negative for depression, suicidal ideas and memory loss. The patient is nervous/anxious. The patient does not have insomnia.      Past Medical History  Diagnosis Date  . Arthritis   . Cancer     squamous cell  . Incontinence of urine   . Allergy   . Oxygen deficiency   . GERD (gastroesophageal reflux disease)   . Squamous cell carcinoma of skin   . Female stress incontinence   . Frequent loose stools   . TIA (transient ischemic attack) 01/05/13   Past Surgical History  Procedure Laterality Date  . Breast surgery  1982    breast  . Tonsillectomy  1940  . Abdominal hysterectomy  1959  . Tee without cardioversion N/A 01/18/2013    Procedure: TRANSESOPHAGEAL ECHOCARDIOGRAM (TEE);  Surgeon: Lelon Perla, MD;  Location: [place];  Service: Cardiovascular;  Laterality: N/A;  . Appendectomy  1959  . Colonoscopy  2013    Dr. Carol Ada   Social History:   reports that she quit smoking about 37 years ago. Her smoking use included Cigarettes. She has a 15 pack-year smoking history. She has never used smokeless tobacco. She reports that she does not drink alcohol or use illicit  drugs.  Family History  Problem Relation Age of Onset  . Heart disease Mother     failure  . Cancer Sister     breast  . Cancer Father   . Cancer Son     lung    Medications: Patient's Medications  New Prescriptions   No medications on file  Previous Medications   ASCORBIC ACID (VITAMIN C) 100 MG TABLET    Take 100 mg by mouth daily.   ASPIRIN EC 81 MG TABLET    Take 81 mg by mouth. Take one three times a week   BETA CAROTENE W/MINERALS (OCUVITE) TABLET    Take 1 tablet by mouth daily.   CALCIUM CARBONATE-VITAMIN D  (CALCIUM 600 + D PO)    Take 1 tablet by mouth daily.  Modified Medications   No medications on file  Discontinued Medications   AZITHROMYCIN (ZITHROMAX) 250 MG TABLET    Take 2 tabs PO x 1 dose, then 1 tab PO QD x 4 days     Physical Exam: Physical Exam  Constitutional: She is oriented to person, place, and time. She appears well-developed and well-nourished. No distress.  HENT:  Head: Normocephalic and atraumatic.  Right Ear: External ear normal.  Left Ear: External ear normal.  Nose: Nose normal.  Mouth/Throat: Oropharynx is clear and moist. No oropharyngeal exudate.  Eyes: Conjunctivae and EOM are normal. Pupils are equal, round, and reactive to light. Right eye exhibits no discharge. Left eye exhibits no discharge. No scleral icterus.  Neck: Normal range of motion. Neck supple. No JVD present. No tracheal deviation present. No thyromegaly present.  Cardiovascular: Normal rate, regular rhythm, normal heart sounds and intact distal pulses.   No murmur heard. Pulmonary/Chest: Effort normal and breath sounds normal. No stridor. No respiratory distress. She has no wheezes. She has no rales.  Abdominal: Soft. Bowel sounds are normal. She exhibits no distension. There is no tenderness. There is no rebound and no guarding.  Genitourinary: Vagina normal. Guaiac negative stool. No vaginal discharge found.  Musculoskeletal: She exhibits no edema or tenderness.  Lymphadenopathy:    She has no cervical adenopathy.  Neurological: She is alert and oriented to person, place, and time. She displays normal reflexes. No cranial nerve deficit. She exhibits normal muscle tone. Coordination normal.  Skin: No rash noted. She is not diaphoretic. No erythema. No pallor.  Brownish pigmented BLE. Left breast biopsy and the right 5th toe amputation.   Psychiatric: She has a normal mood and affect. Her behavior is normal. Judgment and thought content normal.  Talks constantly and changes her subjects  frequently.     Filed Vitals:   03/24/14 1354  BP: 100/58  Pulse: 60  Temp: 98.4 F (36.9 C)  TempSrc: Oral  Height: 5\' 1"  (1.549 m)  Weight: 98 lb (44.453 kg)      Labs reviewed: Basic Metabolic Panel: No results for input(s): NA, K, CL, CO2, GLUCOSE, BUN, CREATININE, CALCIUM, MG, PHOS, TSH in the last 8760 hours. Liver Function Tests: No results for input(s): AST, ALT, ALKPHOS, BILITOT, PROT, ALBUMIN in the last 8760 hours. CBC:  Recent Labs  03/09/14 1304  WBC 10.7*  HGB 14.6  HCT 44.4  MCV 92.5   Lipid Panel: No results for input(s): CHOL, HDL, LDLCALC, TRIG, CHOLHDL, LDLDIRECT in the last 8760 hours.  Past Procedures:  01/07/2013 CLINICAL DATA: Headache. TIA. EXAM: CT HEAD WITHOUT CONTRAST IMPRESSION: Atrophy and chronic microvascular ischemic change. No acute abnormality and no change from the prior  01/07/2013 *RADIOLOGY REPORT* Clinical Data: Transient blurred vision, possible transient ischemic attack BILATERAL CAROTID DUPLEX ULTRASOUND IMPRESSION: Trace atherosclerotic plaque results in less than 50% stenoses of the bilateral internal carotid arteries. No significant interval progression compared to 01/16/2011.  01/12/2013 *RADIOLOGY REPORT* Clinical Data: Left knee pain status post fall LEFT TIBIA AND FIBULA -  IMPRESSION: Probable impacted lateral tibial plateau fracture. No evidence of fracture or malalignment in the mid or distal lower leg.    01/12/2013 *RADIOLOGY REPORT* Clinical Data: Fall, knee pain LEFT KNEE - COMPLETE 4+ VIEW IMPRESSION: Agree with preliminary report: probable impacted lateral tibial plateau fracture with associated large suprapatellar joint effusion. Atherosclerotic calcifications in the superficial femoral artery.   01/13/2013 CLINICAL DATA: TIA. Frequent falls recently. Dizziness. EXAM: MRI HEAD WITHOUT CONTRAST MRA HEAD WITHOUT CONTRAST MRI HEAD IMPRESSION 1. Mild generalized atrophy and white matter disease is likely within normal  limits for age. 2. No acute intracranial abnormality. MRA HEAD IMPRESSION 1. Normal MRA of the brain for age. 2. No evidence for significant proximal stenosis, aneurysm, or branch vessel occlusion.  01/13/2013 CLINICAL DATA: Syncope. EXAM: PORTABLE CHEST - IMPRESSION: No active disease.   01/13/2013 CLINICAL DATA: Near syncope EXAM: CT HEAD WITHOUT CONTRAST IMPRESSION: 1. No evidence of acute intracranial disease. 2. Senescent brain changes, age appropriate.   02/05/13   X-ray of the left shoulder: mild to moderate osteopenia, mild osteoarthritis, no acute fracture, malalignment, or lytic destructive lesion, slight old healed fracture deformity at the lateral left clavicle.   Assessment/Plan Insomnia Sleeps well, off Mirtazapine 7.5mg  nightly.   GERD (gastroesophageal reflux disease) Stable off PPI  Hearing loss Hearing aids  Urine, incontinence, stress female Leakage, adult pads for urinary incontinent care.   TIA (transient ischemic attack) 01/12/13: recommended Aggrenox-patient only took 2-3x ASA 81mg /week. No new s/s TIA in the past year. Will recommend ASA 81mg  daily.     Family/ Staff Communication: observe the patient  Goals of Care: [place]  Labs/tests ordered: none

## 2014-03-24 NOTE — Assessment & Plan Note (Signed)
01/12/13: recommended Aggrenox-patient only took 2-3x ASA 81mg /week. No new s/s TIA in the past year. Will recommend ASA 81mg  daily.

## 2014-03-24 NOTE — Assessment & Plan Note (Addendum)
Sleeps well, off Mirtazapine 7.5mg  nightly.

## 2014-03-24 NOTE — Assessment & Plan Note (Addendum)
Stable off PPI

## 2014-03-24 NOTE — Assessment & Plan Note (Signed)
Leakage, adult pads for urinary incontinent care.

## 2014-03-30 ENCOUNTER — Telehealth: Payer: Self-pay

## 2014-03-30 ENCOUNTER — Ambulatory Visit (INDEPENDENT_AMBULATORY_CARE_PROVIDER_SITE_OTHER): Payer: Medicare Other | Admitting: Internal Medicine

## 2014-03-30 ENCOUNTER — Encounter: Payer: Self-pay | Admitting: Internal Medicine

## 2014-03-30 VITALS — BP 118/60 | HR 82 | Ht 61.0 in | Wt 100.0 lb

## 2014-03-30 DIAGNOSIS — R918 Other nonspecific abnormal finding of lung field: Secondary | ICD-10-CM

## 2014-03-30 NOTE — Patient Instructions (Signed)
Please schedule a follow up visit in 3 months but call sooner if needed with cxr on return  

## 2014-03-30 NOTE — Progress Notes (Signed)
Subjective:    Patient ID: [name], female    DOB: [birth year], 80 y.o.   MRN: [id number]  HPI  48 yowf quit smoking 1978 with tendency to allergy problems all her life consistenting of lots of nasal congestion down into chest and resolves over a few weeks s rx then onset  Nov 2015 with fever 101 > [facility] home nurse>  urgent care cxr > ct > mpn's so referred to pulmonary clinic  03/30/2014    03/30/2014 1st [name]   Chief Complaint  Patient presents with  . Pulmonary Consult    Referred by Dr. Arlyss Queen for eval of pulmonary nodules. Pt states had "allergy attack" a couple wks ago- had alot of cough and fever at that time and so cxr was done and then ct chest.   Back to baseline now and very sedendentary but Not limited by breathing from desired activities    No obvious other patterns in day to day or daytime variabilty or assoc excess or purulent sputum  or cp or chest tightness, subjective wheeze overt sinus or hb symptoms. No unusual exp hx or h/o childhood pna/ asthma or knowledge of premature birth.  Sleeping ok without nocturnal  or early am exacerbation  of respiratory  c/o's or need for noct saba. Also denies any obvious fluctuation of symptoms with weather or environmental changes or other aggravating or alleviating factors except as outlined above   Current Medications, Allergies, Complete Past Medical History, Past Surgical History, Family History, and Social History were reviewed in [employer] record.  Had skin ca, squamous only, no other ca.            Review of Systems  Constitutional: Negative for fever, chills and unexpected weight change.  HENT: Positive for congestion and postnasal drip. Negative for dental problem, ear pain, nosebleeds, rhinorrhea, sinus pressure, sneezing, sore throat, trouble swallowing and voice change.   Eyes: Negative for visual disturbance.  Respiratory: Positive for cough.  Negative for choking and shortness of breath.   Cardiovascular: Negative for chest pain and leg swelling.  Gastrointestinal: Negative for vomiting, abdominal pain and diarrhea.  Genitourinary: Negative for difficulty urinating.  Musculoskeletal: Negative for arthralgias.  Skin: Negative for rash.  Neurological: Negative for tremors, syncope and headaches.  Hematological: Does not bruise/bleed easily.       Objective:   Physical Exam  amb hoarse wf   Wt Readings from Last 3 Encounters:  03/30/14 100 lb (45.36 kg)  03/24/14 98 lb (44.453 kg)  03/09/14 98 lb (44.453 kg)    Vital signs reviewed   HEENT: nl dentition, turbinates, and orophanx. Nl external ear canals without cough reflex   NECK :  without JVD/Nodes/TM/ nl carotid upstrokes bilaterally   LUNGS: no acc muscle use, clear to A and P bilaterally without cough on insp or exp maneuvers   CV:  RRR  no s3 or murmur or increase in P2, no edema   ABD:  soft and nontender with nl excursion in the supine position. No bruits or organomegaly, bowel sounds nl  MS:  warm without deformities, calf tenderness, cyanosis or clubbing  SKIN: warm and dry without lesions    NEURO:  alert, approp, no deficits   cxr 03/09/14 no sign acute findings  CT chest 03/15/14 Bilateral pulmonary nodules with a dominant nodules in the right lower and right upper lobes which could represent carcinoma.     Assessment & Plan:

## 2014-03-30 NOTE — Telephone Encounter (Signed)
Patient saw Dr Melvyn Novas today at [facility] for her lungs. Per patient Dr Melvyn Novas evaluated her and felt she didn't need to see him everything look fine. Patient concerned and wants to know what her xrays showed and why she was sent to Dr. Melvyn Novas. Patients call back number is [phone]

## 2014-03-30 NOTE — Telephone Encounter (Signed)
Dr. Daub please advise. 

## 2014-03-31 NOTE — Telephone Encounter (Signed)
Pt understands the reason for the referral.

## 2014-03-31 NOTE — Telephone Encounter (Signed)
She was sent to Dr. Melvyn Novas because a CT of his chest showed bilateral pulmonary nodules and the radiologist had concerns that this could represent cancer. My suggestion would be to continue to follow-up with Dr. Melvyn Novas or her PCP. I would also suggest she have another chest x-ray in 3-6 months.

## 2014-04-01 DIAGNOSIS — R918 Other nonspecific abnormal finding of lung field: Secondary | ICD-10-CM | POA: Insufficient documentation

## 2014-04-01 NOTE — Assessment & Plan Note (Signed)
Although there are clearly abnormalities on CT scan, they should probably be considered "microscopic" since not obvious on plain cxr .     In the setting of obvious "macroscopic" health issues- the greatest being age 80,,  I am very reluctatnt to embark on an invasive w/u at this point but will arrange consevative  follow up in 3 m  Discussed in detail all the  indications, usual  risks and alternatives  relative to the benefits with patient who agrees to proceed with conservative f/u ps

## 2014-05-26 DIAGNOSIS — Z961 Presence of intraocular lens: Secondary | ICD-10-CM | POA: Diagnosis not present

## 2014-05-26 DIAGNOSIS — H524 Presbyopia: Secondary | ICD-10-CM | POA: Diagnosis not present

## 2014-05-26 DIAGNOSIS — H3531 Nonexudative age-related macular degeneration: Secondary | ICD-10-CM | POA: Diagnosis not present

## 2014-06-09 ENCOUNTER — Encounter: Payer: Self-pay | Admitting: Internal Medicine

## 2014-06-09 DIAGNOSIS — R739 Hyperglycemia, unspecified: Secondary | ICD-10-CM | POA: Insufficient documentation

## 2014-06-09 DIAGNOSIS — I6529 Occlusion and stenosis of unspecified carotid artery: Secondary | ICD-10-CM | POA: Insufficient documentation

## 2014-06-14 DIAGNOSIS — I6529 Occlusion and stenosis of unspecified carotid artery: Secondary | ICD-10-CM | POA: Diagnosis not present

## 2014-06-14 DIAGNOSIS — R739 Hyperglycemia, unspecified: Secondary | ICD-10-CM | POA: Diagnosis not present

## 2014-06-14 DIAGNOSIS — G459 Transient cerebral ischemic attack, unspecified: Secondary | ICD-10-CM | POA: Diagnosis not present

## 2014-06-14 LAB — LIPID PANEL
Cholesterol: 174 mg/dL (ref 0–200)
HDL: 96 mg/dL — AB (ref 35–70)
LDL CALC: 64 mg/dL
Triglycerides: 69 mg/dL (ref 40–160)

## 2014-06-14 LAB — BASIC METABOLIC PANEL
BUN: 22 mg/dL — AB (ref 4–21)
CREATININE: 0.8 mg/dL (ref 0.5–1.1)
GLUCOSE: 76 mg/dL
Potassium: 4.7 mmol/L (ref 3.4–5.3)
Sodium: 139 mmol/L (ref 137–147)

## 2014-06-14 LAB — HEPATIC FUNCTION PANEL
ALK PHOS: 55 U/L (ref 25–125)
ALT: 17 U/L (ref 7–35)
AST: 24 U/L (ref 13–35)
Bilirubin, Total: 0.5 mg/dL

## 2014-06-14 LAB — HEMOGLOBIN A1C: Hgb A1c MFr Bld: 6 % (ref 4.0–6.0)

## 2014-06-17 ENCOUNTER — Other Ambulatory Visit: Payer: Self-pay

## 2014-06-29 ENCOUNTER — Ambulatory Visit: Payer: Medicare Other | Admitting: Internal Medicine

## 2014-07-05 ENCOUNTER — Ambulatory Visit (INDEPENDENT_AMBULATORY_CARE_PROVIDER_SITE_OTHER)
Admission: RE | Admit: 2014-07-05 | Discharge: 2014-07-05 | Disposition: A | Discharge status: Home / Self Care | Payer: Medicare Other | Source: Ambulatory Visit | Attending: Internal Medicine | Admitting: Internal Medicine

## 2014-07-05 ENCOUNTER — Encounter: Payer: Self-pay | Admitting: Internal Medicine

## 2014-07-05 ENCOUNTER — Ambulatory Visit (INDEPENDENT_AMBULATORY_CARE_PROVIDER_SITE_OTHER): Payer: Medicare Other | Admitting: Internal Medicine

## 2014-07-05 DIAGNOSIS — R918 Other nonspecific abnormal finding of lung field: Secondary | ICD-10-CM

## 2014-07-05 DIAGNOSIS — J449 Chronic obstructive pulmonary disease, unspecified: Secondary | ICD-10-CM | POA: Diagnosis not present

## 2014-07-05 NOTE — Progress Notes (Signed)
Subjective:    Patient ID: [name], female    DOB: [birth year]    MRN: [id number]    Brief patient profile:  56  yowf quit smoking 1978 with tendency to allergy problems all her life consistenting of lots of nasal congestion down into chest and resolves over a few weeks s rx then onset  Nov 2015 with fever 101 > [facility] home nurse>  urgent care cxr > ct > mpn's so referred to pulmonary clinic  03/30/2014    History of Present Illness  03/30/2014 1st [facility] office visit/ Wert   Chief Complaint  Patient presents with  . Pulmonary Consult    Referred by Dr. Arlyss Queen for eval of pulmonary nodules. Pt states had "allergy attack" a couple wks ago- had alot of cough and fever at that time and so cxr was done and then ct chest.   Back to baseline now and very sedendentary but Not limited by breathing from desired activities   rec No change rx/ f/u cxr on return   07/05/2014 f/u ov/Wert re: mpns Chief Complaint  Patient presents with  . Follow-up    3 month follow up. States feeling well during visit. Denies SOB, wheezing, chest pain or tightness, or cough      No   chronic cough or cp or chest tightness, subjective wheeze overt sinus or hb symptoms. No unusual exp hx or h/o childhood pna/ asthma or knowledge of premature birth.  Sleeping ok without nocturnal  or early am exacerbation  of respiratory  c/o's or need for noct saba. Also denies any obvious fluctuation of symptoms with weather or environmental changes or other aggravating or alleviating factors except as outlined above   Current Medications, Allergies, Complete Past Medical History, Past Surgical History, Family History, and Social History were reviewed in [employer] record.  ROS  The following are not active complaints unless bolded sore throat, dysphagia, dental problems, itching, sneezing,  nasal congestion or excess/ purulent secretions, ear ache,   fever, chills, sweats,  unintended wt loss, pleuritic or exertional cp, hemoptysis,  orthopnea pnd or leg swelling, presyncope, palpitations, heartburn, abdominal pain, anorexia, nausea, vomiting, diarrhea  or change in bowel or urinary habits, change in stools or urine, dysuria,hematuria,  rash, arthralgias, visual complaints, headache, numbness weakness or ataxia or problems with walking or coordination,  change in mood/affect or memory.                    Objective:   Physical Exam  amb hoarse wf     Wt Readings from Last 3 Encounters:  07/05/14 99 lb (44.906 kg)  03/30/14 100 lb (45.36 kg)  03/24/14 98 lb (44.453 kg)    Vital signs reviewed   HEENT: nl dentition, turbinates, and orophanx. Nl external ear canals without cough reflex   NECK :  without JVD/Nodes/TM/ nl carotid upstrokes bilaterally   LUNGS: no acc muscle use, clear to A and P bilaterally without cough on insp or exp maneuvers   CV:  RRR  no s3 or murmur or increase in P2, no edema   ABD:  soft and nontender with nl excursion in the supine position. No bruits or organomegaly, bowel sounds nl  MS:  warm without deformities, calf tenderness, cyanosis or clubbing  SKIN: warm and dry without lesions    NEURO:  alert, approp, no deficits      CT chest 03/15/14 Bilateral pulmonary nodules with a dominant nodules in  the right lower and right upper lobes which could represent carcinoma.    I personally reviewed images and agree with radiology impression as follows:  CXR:  07/05/14  Bilateral lung nodules are difficult to see on the current study. Radiographic follow-up of these lesions would be best done with CT or PET scan.      Assessment & Plan:

## 2014-07-05 NOTE — Patient Instructions (Addendum)
Please remember to go to the   x-ray department downstairs for your tests - we will call you with the results when they are available.  Please schedule a follow up office visit in 6 months, call sooner if needed

## 2014-07-06 NOTE — Progress Notes (Signed)
Quick Note:  Spoke with pt and notified of results per Dr. Wert. Pt verbalized understanding and denied any questions.  ______ 

## 2014-07-07 ENCOUNTER — Encounter: Payer: Self-pay | Admitting: Internal Medicine

## 2014-07-07 ENCOUNTER — Telehealth: Payer: Self-pay | Admitting: Internal Medicine

## 2014-07-07 NOTE — Telephone Encounter (Signed)
Pt called back-does not need anyone to call her back.

## 2014-07-07 NOTE — Telephone Encounter (Signed)
ATC, NA and no option to leave a msg, WCB 

## 2014-07-07 NOTE — Assessment & Plan Note (Signed)
See CT chest 03/15/14  - not vz on cxr 07/05/14 and pt not wishing to pursue tissue dx for "microscopic" multifocal dz  I had an extended discussion with the patient reviewing all relevant studies completed to date and  lasting 15 to 20 minutes of a 25 minute visit on the following ongoing concerns:  Discussed in detail all the  indications, usual  risks and alternatives  relative to the benefits with patient who agrees to proceed with conservative f/u only in the absence of any resp symptoms at all and given that this is multicentric microscopic dz at age 81 ? Will it ever impact her in any way and even if so what would we do about it other than treat the symptoms?   See instructions for specific recommendations which were reviewed directly with the patient who was given a copy with highlighter outlining the key components.

## 2014-08-01 ENCOUNTER — Other Ambulatory Visit: Payer: Self-pay | Admitting: Internal Medicine

## 2014-08-01 DIAGNOSIS — Z1231 Encounter for screening mammogram for malignant neoplasm of breast: Secondary | ICD-10-CM

## 2014-08-08 ENCOUNTER — Ambulatory Visit (HOSPITAL_COMMUNITY)
Admission: RE | Admit: 2014-08-08 | Discharge: 2014-08-08 | Disposition: A | Discharge status: Home / Self Care | Payer: Medicare Other | Source: Ambulatory Visit | Attending: Internal Medicine | Admitting: Internal Medicine

## 2014-08-08 ENCOUNTER — Ambulatory Visit (HOSPITAL_COMMUNITY): Payer: Medicare Other

## 2014-08-08 DIAGNOSIS — Z1231 Encounter for screening mammogram for malignant neoplasm of breast: Secondary | ICD-10-CM | POA: Diagnosis not present

## 2014-09-15 ENCOUNTER — Non-Acute Institutional Stay: Payer: Medicare Other | Admitting: Internal Medicine

## 2014-09-15 ENCOUNTER — Encounter: Payer: Self-pay | Admitting: Internal Medicine

## 2014-09-15 VITALS — BP 124/60 | HR 76 | Temp 98.1°F | Wt 99.0 lb

## 2014-09-15 DIAGNOSIS — S86111A Strain of other muscle(s) and tendon(s) of posterior muscle group at lower leg level, right leg, initial encounter: Secondary | ICD-10-CM | POA: Diagnosis not present

## 2014-09-15 NOTE — Progress Notes (Signed)
Patient ID: [name], female   DOB: Nov 02, 1922, 81 y.o.   MRN: [id number]    [facility]     [facility] (12[facility]     Allergies  Allergen Reactions  . Penicillin G Itching  . Septra [Bactrim]     Stomach concerns.     Chief Complaint  Patient presents with  . Acute Visit    pain and swelling in right leg from ankle to knee. No injury, gotten worse past 6 days    HPI:  Patient complains of pain in the lateral right calf area. There is also swelling in the right ankle. Patient was doing work in [employer] at [employer] a day prior to onset of her symptoms. 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 She has never had any problems like this before. There is no history of injury to the ankle or calf. There is no history of falls. She does have a previous history of cellulitis in her legs many years ago, but has not been running a fever and there has been no warmth in the leg.  Medications: Patient's Medications  New Prescriptions   No medications on file  Previous Medications   ASCORBIC ACID (VITAMIN C) 100 MG TABLET    Take 100 mg by mouth daily.   ASPIRIN EC 81 MG TABLET    Take 81 mg by mouth. Take one three times a week   BETA CAROTENE W/MINERALS (OCUVITE) TABLET    Take 1 tablet by mouth daily.   CALCIUM CARBONATE-VITAMIN D (CALCIUM 600 + D PO)    Take 1 tablet by mouth daily.  Modified Medications   No medications on file  Discontinued Medications   No medications on file     Review of Systems  Constitutional: Negative.   HENT: Positive for hearing loss and voice change (hoarse). Negative for sore throat.   Eyes: Negative.   Respiratory: Negative for choking and chest tightness.   Cardiovascular: Negative for chest pain, palpitations and leg swelling.  Gastrointestinal: Positive for diarrhea.  Endocrine: Negative.   Genitourinary:       Incontinent of urine.    Musculoskeletal:       Left knee pain. Hx of tibial plateau fracture. Osteoarthritis of the fingers. Tender in the lateral right mid -calf area.  Skin: Negative.   Neurological: Negative for tremors, seizures, weakness, numbness and headaches.  Hematological: Negative.   Psychiatric/Behavioral: Negative for behavioral problems, confusion and dysphoric mood. The patient is not nervous/anxious.     Filed Vitals:   09/15/14 1140  BP: 124/60  Pulse: 76  Temp: 98.1 F (36.7 C)  TempSrc: Oral  Weight: 99 lb (44.906 kg)  SpO2: 99%   Body mass index is 18.72 kg/(m^2).  Physical Exam  Constitutional: She is oriented to person, place, and time. She appears well-developed and well-nourished. No distress.  HENT:  Head: Normocephalic and atraumatic.  Right Ear: External ear normal.  Left Ear: External ear normal.  Nose: Nose normal.  Mouth/Throat: Oropharynx is clear and moist. No oropharyngeal exudate.  Bilateral hearing aids. Profound loss of hearing.  Eyes: Conjunctivae and EOM are normal. Pupils are equal, round, and reactive to light. Right eye exhibits no discharge. Left eye exhibits no discharge. No scleral icterus.  Neck: Normal range of motion. Neck supple. No JVD present. No tracheal deviation present. No thyromegaly present.  Cardiovascular: Normal rate, regular rhythm, normal heart sounds and  intact distal pulses.   No murmur heard. Pulmonary/Chest: Effort normal and breath sounds normal. No stridor. No respiratory distress. She has no wheezes. She has no rales.  Abdominal: Soft. Bowel sounds are normal. She exhibits no distension. There is no tenderness. There is no rebound and no guarding.  Musculoskeletal: She exhibits edema and tenderness.  Left knee pain.  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 Tender in the lateral aspect of the right calf muscle. No palpable venous cords. There is no tenderness in the medial thigh or groin. No palpable lymph  nodes. Patient is able to walk.  Lymphadenopathy:    She has no cervical adenopathy.  Neurological: She is alert and oriented to person, place, and time. She displays normal reflexes. No cranial nerve deficit. She exhibits normal muscle tone. Coordination normal.  Skin: No rash noted. She is not diaphoretic. There is erythema. No pallor.  Brownish pigmented BLE  Psychiatric: She has a normal mood and affect. Her behavior is normal. Judgment and thought content normal.  Talks constantly and changes her subjects frequently.      Labs reviewed: No visits with results within 3 Month(s) from this visit. Latest known visit with results is:  Lab on 06/17/2014  Component Date Value Ref Range Status  . Glucose 06/14/2014 76   Final  . BUN 06/14/2014 22* 4 - 21 mg/dL Final  . Creatinine 06/14/2014 0.8  0.5 - 1.1 mg/dL Final  . Potassium 06/14/2014 4.7  3.4 - 5.3 mmol/L Final  . Sodium 06/14/2014 139  137 - 147 mmol/L Final  . Triglycerides 06/14/2014 69  40 - 160 mg/dL Final  . Cholesterol 06/14/2014 174  0 - 200 mg/dL Final  . HDL 06/14/2014 96* 35 - 70 mg/dL Final  . LDL Cholesterol 06/14/2014 64   Final  . Alkaline Phosphatase 06/14/2014 55  25 - 125 U/L Final  . ALT 06/14/2014 17  7 - 35 U/L Final  . AST 06/14/2014 24  13 - 35 U/L Final  . Bilirubin, Total 06/14/2014 0.5   Final  . Hgb A1c MFr Bld 06/14/2014 6.0  4.0 - 6.0 % Final     Assessment/Plan  1. Strain of right soleus muscle, initial encounter Discussed possible etiologies of her symptoms with the patient. I do not think that there is evidence for DVT. There is no history of localized trauma or falls. Onset of symptoms was preceded by a day that she was running a bingo game and was getting up and down and walking a lot. I think her symptoms are most compatible with a soleus strain. She does not find the pain enough of a problem to warrant taking Aleve or other pain relievers. She seems satisfied with the explanation. Although  there is edema, I think this is related to the injury and should resolve on its own without the addition of a diuretic or compression stockings. Patient will return if her symptoms get worse. She has a follow-up appointment on  10/06/2014.

## 2014-10-06 ENCOUNTER — Non-Acute Institutional Stay: Payer: Medicare Other | Admitting: Nurse Practitioner

## 2014-10-06 ENCOUNTER — Encounter: Payer: Self-pay | Admitting: Nurse Practitioner

## 2014-10-06 VITALS — BP 104/52 | HR 72 | Temp 97.8°F | Wt 100.0 lb

## 2014-10-06 DIAGNOSIS — R519 Headache, unspecified: Secondary | ICD-10-CM | POA: Insufficient documentation

## 2014-10-06 DIAGNOSIS — K219 Gastro-esophageal reflux disease without esophagitis: Secondary | ICD-10-CM | POA: Diagnosis not present

## 2014-10-06 DIAGNOSIS — R51 Headache: Secondary | ICD-10-CM

## 2014-10-06 DIAGNOSIS — S86111D Strain of other muscle(s) and tendon(s) of posterior muscle group at lower leg level, right leg, subsequent encounter: Secondary | ICD-10-CM | POA: Diagnosis not present

## 2014-10-06 DIAGNOSIS — G459 Transient cerebral ischemic attack, unspecified: Secondary | ICD-10-CM

## 2014-10-06 DIAGNOSIS — R609 Edema, unspecified: Secondary | ICD-10-CM

## 2014-10-06 NOTE — Progress Notes (Signed)
Patient ID: [name], female   DOB: [birth year], 81 y.o.   MRN: [id number]   Code Status: prior  Allergies  Allergen Reactions  . Penicillin G Itching  . Septra [Bactrim]     Stomach concerns.     Chief Complaint  Patient presents with  . Medical Management of Chronic Issues    GERD, TIA  . Headache    yesterday right side of head, temple area. BP 156/80, then 136/66 took Tylenol when to bed early.  . Leg Swelling    and foot, right    HPI: Patient is a 81 y.o. female seen in the clinic at [facility] today for chronic medical conditions.  Problem List Items Addressed This Visit    GERD (gastroesophageal reflux disease)    Stable off PPI       TIA (transient ischemic attack)   Strain of right soleus muscle    On and off pain 2-3x/week-desires Ortho consultation.       Headache above the eye region - Primary    The right forehead to the right temple areas for 24 hours, episodic, 10/05/14 afternoon about 30 minutes, no nausea, vomiting, visual change, or focal neurological symptoms associated with, resolved w/o intervention. 2-3 x 10/06/14 lasted 3-5 minutes with each time and resolved w/o intervention. Will CT head w/o contrast to evaluate further.       Edema    Trace and chronic.          Review of Systems:  Review of Systems  Constitutional: Negative.   HENT: Positive for hearing loss and voice change (hoarse). Negative for sore throat.   Eyes: Negative.   Respiratory: Negative for choking and chest tightness.   Cardiovascular: Negative for chest pain, palpitations and leg swelling.  Gastrointestinal: Positive for diarrhea.  Endocrine: Negative.   Genitourinary:       Incontinent of urine.  Musculoskeletal:       Left knee pain. Hx of tibial plateau fracture. Osteoarthritis of the fingers. Tender in the lateral right mid -calf area.  Skin: Negative.   Neurological: Positive for headaches. Negative for tremors, seizures, weakness and  numbness.       New onset headaches x 24hours. 10/05/14 lasted 23minutes severe HA @ R forehead and temple areas. 2-3 x today lasted a few minutes each time w/o focal neurological symptoms.   Hematological: Negative.   Psychiatric/Behavioral: Negative for behavioral problems, confusion and dysphoric mood. The patient is not nervous/anxious.       Past Medical History  Diagnosis Date  . Arthritis   . Cancer     squamous cell  . Incontinence of urine   . Allergy   . Oxygen deficiency   . GERD (gastroesophageal reflux disease)   . Squamous cell carcinoma of skin   . Female stress incontinence   . Frequent loose stools   . TIA (transient ischemic attack) 01/05/13   Past Surgical History  Procedure Laterality Date  . Breast surgery  1982    breast  . Tonsillectomy  1940  . Abdominal hysterectomy  1959  . Tee without cardioversion N/A 01/18/2013    Procedure: TRANSESOPHAGEAL ECHOCARDIOGRAM (TEE);  Surgeon: Lelon Perla, MD;  Location: [place];  Service: Cardiovascular;  Laterality: N/A;  . Appendectomy  1959  . Colonoscopy  2013    Dr. Carol Ada   Social History:   reports that she quit smoking about 37 years ago. Her smoking use included Cigarettes. She has a 15 pack-year  smoking history. She has never used smokeless tobacco. She reports that she does not drink alcohol or use illicit drugs.  Family History  Problem Relation Age of Onset  . Heart disease Mother     failure  . Cancer Sister     breast  . Cancer Father   . Cancer Son     lung    Medications: Patient's Medications  New Prescriptions   No medications on file  Previous Medications   ASCORBIC ACID (VITAMIN C) 1000 MG TABLET    Take 1,000 mg by mouth daily.   ASPIRIN EC 81 MG TABLET    Take 81 mg by mouth. Take one three times a week   B COMPLEX VITAMINS TABLET    Take 1 tablet by mouth daily.   BETA CAROTENE W/MINERALS (OCUVITE) TABLET    Take 1 tablet by mouth daily.   CALCIUM CARBONATE-VITAMIN  D (CALCIUM 600 + D PO)    Take 1 tablet by mouth daily.  Modified Medications   No medications on file  Discontinued Medications   ASCORBIC ACID (VITAMIN C) 100 MG TABLET    Take 100 mg by mouth daily.     Physical Exam: Physical Exam  Constitutional: She is oriented to person, place, and time. She appears well-developed and well-nourished. No distress.  HENT:  Head: Normocephalic and atraumatic.  Right Ear: External ear normal.  Left Ear: External ear normal.  Nose: Nose normal.  Mouth/Throat: Oropharynx is clear and moist. No oropharyngeal exudate.  Bilateral hearing aids. Profound loss of hearing.  Eyes: Conjunctivae and EOM are normal. Pupils are equal, round, and reactive to light. Right eye exhibits no discharge. Left eye exhibits no discharge. No scleral icterus.  Neck: Normal range of motion. Neck supple. No JVD present. No tracheal deviation present. No thyromegaly present.  Cardiovascular: Normal rate, regular rhythm, normal heart sounds and intact distal pulses.   No murmur heard. Pulmonary/Chest: Effort normal and breath sounds normal. No stridor. No respiratory distress. She has no wheezes. She has no rales.  Abdominal: Soft. Bowel sounds are normal. She exhibits no distension. There is no tenderness. There is no rebound and no guarding.  Musculoskeletal: She exhibits edema and tenderness.  Left knee pain.  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 Tender in the lateral aspect of the right calf muscle. No palpable venous cords. There is no tenderness in the medial thigh or groin. No palpable lymph nodes. Patient is able to walk.  Lymphadenopathy:    She has no cervical adenopathy.  Neurological: She is alert and oriented to person, place, and time. She displays normal reflexes. No cranial nerve deficit. She exhibits normal muscle tone. Coordination normal.  Skin: No rash noted. She is not diaphoretic. There is erythema. No pallor.  Brownish pigmented  BLE  Psychiatric: She has a normal mood and affect. Her behavior is normal. Judgment and thought content normal.  Talks constantly and changes her subjects frequently.     Filed Vitals:   10/06/14 1407  BP: 104/52  Pulse: 72  Temp: 97.8 F (36.6 C)  TempSrc: Oral  Weight: 100 lb (45.36 kg)  SpO2: 97%      Labs reviewed: Basic Metabolic Panel:  Recent Labs  06/14/14  NA 139  K 4.7  BUN 22*  CREATININE 0.8   Liver Function Tests:  Recent Labs  06/14/14  AST 24  ALT 17  ALKPHOS 55   No results for input(s): LIPASE, AMYLASE in the last  8760 hours. No results for input(s): AMMONIA in the last 8760 hours. CBC:  Recent Labs  03/09/14 1304  WBC 10.7*  HGB 14.6  HCT 44.4  MCV 92.5   Lipid Panel:  Recent Labs  06/14/14  CHOL 174  HDL 96*  LDLCALC 64  TRIG 69   Anemia Panel: No results for input(s): FOLATE, IRON, VITAMINB12 in the last 8760 hours.  Past Procedures:  01/07/2013 CLINICAL DATA: Headache. TIA. EXAM: CT HEAD WITHOUT CONTRAST IMPRESSION: Atrophy and chronic microvascular ischemic change. No acute abnormality and no change from the prior  01/07/2013 *RADIOLOGY REPORT* Clinical Data: Transient blurred vision, possible transient ischemic attack BILATERAL CAROTID DUPLEX ULTRASOUND IMPRESSION: Trace atherosclerotic plaque results in less than 50% stenoses of the bilateral internal carotid arteries. No significant interval progression compared to 01/16/2011.  01/12/2013 *RADIOLOGY REPORT* Clinical Data: Left knee pain status post fall LEFT TIBIA AND FIBULA - IMPRESSION: Probable impacted lateral tibial plateau fracture. No evidence of fracture or malalignment in the mid or distal lower leg.    01/12/2013 *RADIOLOGY REPORT* Clinical Data: Fall, knee pain LEFT KNEE - COMPLETE 4+ VIEW IMPRESSION: Agree with preliminary report: probable impacted lateral tibial plateau fracture with associated large suprapatellar joint effusion. Atherosclerotic calcifications in  the superficial femoral artery.   01/13/2013 CLINICAL DATA: TIA. Frequent falls recently. Dizziness. EXAM: MRI HEAD WITHOUT CONTRAST MRA HEAD WITHOUT CONTRAST MRI HEAD IMPRESSION 1. Mild generalized atrophy and white matter disease is likely within normal limits for age. 2. No acute intracranial abnormality. MRA HEAD IMPRESSION 1. Normal MRA of the brain for age. 2. No evidence for significant proximal stenosis, aneurysm, or branch vessel occlusion.  01/13/2013 CLINICAL DATA: Syncope. EXAM: PORTABLE CHEST - IMPRESSION: No active disease.   01/13/2013 CLINICAL DATA: Near syncope EXAM: CT HEAD WITHOUT CONTRAST IMPRESSION: 1. No evidence of acute intracranial disease. 2. Senescent brain changes, age appropriate.   02/05/13   X-ray of the left shoulder: mild to moderate osteopenia, mild osteoarthritis, no acute fracture, malalignment, or lytic destructive lesion, slight old healed fracture deformity at the lateral left clavicle   Assessment/Plan Headache above the eye region The right forehead to the right temple areas for 24 hours, episodic, 10/05/14 afternoon about 30 minutes, no nausea, vomiting, visual change, or focal neurological symptoms associated with, resolved w/o intervention. 2-3 x 10/06/14 lasted 3-5 minutes with each time and resolved w/o intervention. Will CT head w/o contrast to evaluate further.   Strain of right soleus muscle On and off pain 2-3x/week-desires Ortho consultation.   Edema Trace and chronic.   GERD (gastroesophageal reflux disease) Stable off PPI     Family/ Staff Communication: observe the patient.   Goals of Care: IL  Labs/tests ordered: CT head w/o contrast, CBC, CMP, TSH

## 2014-10-06 NOTE — Assessment & Plan Note (Signed)
The right forehead to the right temple areas for 24 hours, episodic, 10/05/14 afternoon about 30 minutes, no nausea, vomiting, visual change, or focal neurological symptoms associated with, resolved w/o intervention. 2-3 x 10/06/14 lasted 3-5 minutes with each time and resolved w/o intervention. Will CT head w/o contrast to evaluate further.

## 2014-10-09 DIAGNOSIS — R609 Edema, unspecified: Secondary | ICD-10-CM | POA: Insufficient documentation

## 2014-10-09 NOTE — Assessment & Plan Note (Signed)
Stable off PPI 

## 2014-10-09 NOTE — Assessment & Plan Note (Signed)
Trace and chronic.

## 2014-10-09 NOTE — Assessment & Plan Note (Signed)
On and off pain 2-3x/week-desires Ortho consultation.

## 2014-10-10 DIAGNOSIS — M1712 Unilateral primary osteoarthritis, left knee: Secondary | ICD-10-CM | POA: Diagnosis not present

## 2014-10-10 DIAGNOSIS — M1711 Unilateral primary osteoarthritis, right knee: Secondary | ICD-10-CM | POA: Diagnosis not present

## 2014-10-10 DIAGNOSIS — M19071 Primary osteoarthritis, right ankle and foot: Secondary | ICD-10-CM | POA: Diagnosis not present

## 2014-10-11 LAB — BASIC METABOLIC PANEL
BUN: 17 mg/dL (ref 4–21)
Creatinine: 0.8 mg/dL (ref 0.5–1.1)
Glucose: 90 mg/dL
Potassium: 4.7 mmol/L (ref 3.4–5.3)
Sodium: 133 mmol/L — AB (ref 137–147)

## 2014-10-11 LAB — HEPATIC FUNCTION PANEL
ALT: 15 U/L (ref 7–35)
AST: 19 U/L (ref 13–35)
Alkaline Phosphatase: 51 U/L (ref 25–125)
Bilirubin, Total: 0.5 mg/dL

## 2014-10-11 LAB — CBC AND DIFFERENTIAL
HCT: 40 % (ref 36–46)
Hemoglobin: 13.9 g/dL (ref 12.0–16.0)
PLATELETS: 227 10*3/uL (ref 150–399)
WBC: 6.1 10*3/mL

## 2014-10-13 ENCOUNTER — Encounter: Payer: Self-pay | Admitting: *Deleted

## 2014-10-13 ENCOUNTER — Other Ambulatory Visit: Payer: Self-pay | Admitting: Nurse Practitioner

## 2014-10-19 ENCOUNTER — Other Ambulatory Visit: Payer: Medicare Other

## 2014-10-21 ENCOUNTER — Ambulatory Visit
Admission: RE | Admit: 2014-10-21 | Discharge: 2014-10-21 | Disposition: A | Discharge status: Home / Self Care | Payer: Medicare Other | Source: Ambulatory Visit | Attending: Nurse Practitioner | Admitting: Nurse Practitioner

## 2014-10-21 DIAGNOSIS — R51 Headache: Principal | ICD-10-CM

## 2014-10-21 DIAGNOSIS — R519 Headache, unspecified: Secondary | ICD-10-CM

## 2014-10-21 DIAGNOSIS — G459 Transient cerebral ischemic attack, unspecified: Secondary | ICD-10-CM

## 2014-10-31 ENCOUNTER — Telehealth: Payer: Self-pay | Admitting: *Deleted

## 2014-10-31 NOTE — Telephone Encounter (Signed)
The CT scan of the head was normal. She has chronic aging changes range which are normal. There is nothing on the CT scan suggesting a problem that would cause her headache.

## 2014-10-31 NOTE — Telephone Encounter (Signed)
Patient called and wanted to know if you could address her CT Scan of Head Results. Patient would like the results. Please Advise.

## 2014-11-01 NOTE — Telephone Encounter (Signed)
Spoke with patient gave her the CT report.  Hasn't had any more pain. Glad to know she didn't have a stroke.

## 2014-11-01 NOTE — Telephone Encounter (Signed)
LMOM to return call.

## 2014-11-08 DIAGNOSIS — R739 Hyperglycemia, unspecified: Secondary | ICD-10-CM | POA: Diagnosis not present

## 2014-11-08 DIAGNOSIS — G459 Transient cerebral ischemic attack, unspecified: Secondary | ICD-10-CM | POA: Diagnosis not present

## 2014-11-08 DIAGNOSIS — L03119 Cellulitis of unspecified part of limb: Secondary | ICD-10-CM | POA: Diagnosis not present

## 2014-11-08 DIAGNOSIS — R51 Headache: Secondary | ICD-10-CM | POA: Diagnosis not present

## 2014-11-10 DIAGNOSIS — M19071 Primary osteoarthritis, right ankle and foot: Secondary | ICD-10-CM | POA: Diagnosis not present

## 2014-11-10 DIAGNOSIS — R600 Localized edema: Secondary | ICD-10-CM | POA: Diagnosis not present

## 2014-11-15 DIAGNOSIS — H5712 Ocular pain, left eye: Secondary | ICD-10-CM | POA: Diagnosis not present

## 2014-11-15 DIAGNOSIS — H04123 Dry eye syndrome of bilateral lacrimal glands: Secondary | ICD-10-CM | POA: Diagnosis not present

## 2014-11-16 DIAGNOSIS — L821 Other seborrheic keratosis: Secondary | ICD-10-CM | POA: Diagnosis not present

## 2014-11-16 DIAGNOSIS — D485 Neoplasm of uncertain behavior of skin: Secondary | ICD-10-CM | POA: Diagnosis not present

## 2014-11-16 DIAGNOSIS — D040 Carcinoma in situ of skin of lip: Secondary | ICD-10-CM | POA: Diagnosis not present

## 2014-11-16 DIAGNOSIS — L570 Actinic keratosis: Secondary | ICD-10-CM | POA: Diagnosis not present

## 2014-11-16 DIAGNOSIS — Z85828 Personal history of other malignant neoplasm of skin: Secondary | ICD-10-CM | POA: Diagnosis not present

## 2014-11-22 DIAGNOSIS — H04123 Dry eye syndrome of bilateral lacrimal glands: Secondary | ICD-10-CM | POA: Diagnosis not present

## 2014-11-29 DIAGNOSIS — H04123 Dry eye syndrome of bilateral lacrimal glands: Secondary | ICD-10-CM | POA: Diagnosis not present

## 2014-12-10 DIAGNOSIS — M1712 Unilateral primary osteoarthritis, left knee: Secondary | ICD-10-CM | POA: Diagnosis not present

## 2015-01-01 ENCOUNTER — Emergency Department (HOSPITAL_COMMUNITY): Payer: Medicare Other

## 2015-01-01 ENCOUNTER — Encounter (HOSPITAL_COMMUNITY): Payer: Self-pay | Admitting: *Deleted

## 2015-01-01 ENCOUNTER — Emergency Department (HOSPITAL_COMMUNITY)
Admission: EM | Admit: 2015-01-01 | Discharge: 2015-01-01 | Disposition: A | Discharge status: Home / Self Care | Payer: Medicare Other | Source: Home / Self Care | Attending: Emergency Medicine | Admitting: Emergency Medicine

## 2015-01-01 DIAGNOSIS — Z801 Family history of malignant neoplasm of trachea, bronchus and lung: Secondary | ICD-10-CM | POA: Diagnosis not present

## 2015-01-01 DIAGNOSIS — R7881 Bacteremia: Secondary | ICD-10-CM | POA: Diagnosis not present

## 2015-01-01 DIAGNOSIS — K219 Gastro-esophageal reflux disease without esophagitis: Secondary | ICD-10-CM | POA: Diagnosis present

## 2015-01-01 DIAGNOSIS — R918 Other nonspecific abnormal finding of lung field: Secondary | ICD-10-CM | POA: Diagnosis not present

## 2015-01-01 DIAGNOSIS — E860 Dehydration: Secondary | ICD-10-CM | POA: Diagnosis present

## 2015-01-01 DIAGNOSIS — M1990 Unspecified osteoarthritis, unspecified site: Secondary | ICD-10-CM | POA: Diagnosis present

## 2015-01-01 DIAGNOSIS — Z7982 Long term (current) use of aspirin: Secondary | ICD-10-CM | POA: Diagnosis not present

## 2015-01-01 DIAGNOSIS — A4151 Sepsis due to Escherichia coli [E. coli]: Secondary | ICD-10-CM | POA: Diagnosis not present

## 2015-01-01 DIAGNOSIS — N393 Stress incontinence (female) (male): Secondary | ICD-10-CM | POA: Diagnosis present

## 2015-01-01 DIAGNOSIS — R0602 Shortness of breath: Secondary | ICD-10-CM | POA: Diagnosis not present

## 2015-01-01 DIAGNOSIS — E871 Hypo-osmolality and hyponatremia: Secondary | ICD-10-CM | POA: Diagnosis not present

## 2015-01-01 DIAGNOSIS — R739 Hyperglycemia, unspecified: Secondary | ICD-10-CM | POA: Diagnosis not present

## 2015-01-01 DIAGNOSIS — Z79899 Other long term (current) drug therapy: Secondary | ICD-10-CM | POA: Diagnosis not present

## 2015-01-01 DIAGNOSIS — R509 Fever, unspecified: Secondary | ICD-10-CM | POA: Diagnosis not present

## 2015-01-01 DIAGNOSIS — Z881 Allergy status to other antibiotic agents status: Secondary | ICD-10-CM | POA: Diagnosis not present

## 2015-01-01 DIAGNOSIS — Z8249 Family history of ischemic heart disease and other diseases of the circulatory system: Secondary | ICD-10-CM | POA: Diagnosis not present

## 2015-01-01 DIAGNOSIS — R5381 Other malaise: Secondary | ICD-10-CM | POA: Diagnosis not present

## 2015-01-01 DIAGNOSIS — N390 Urinary tract infection, site not specified: Secondary | ICD-10-CM | POA: Diagnosis not present

## 2015-01-01 DIAGNOSIS — Z8673 Personal history of transient ischemic attack (TIA), and cerebral infarction without residual deficits: Secondary | ICD-10-CM | POA: Diagnosis not present

## 2015-01-01 DIAGNOSIS — Z803 Family history of malignant neoplasm of breast: Secondary | ICD-10-CM | POA: Diagnosis not present

## 2015-01-01 DIAGNOSIS — Z880 Allergy status to penicillin: Secondary | ICD-10-CM | POA: Diagnosis not present

## 2015-01-01 DIAGNOSIS — E861 Hypovolemia: Secondary | ICD-10-CM | POA: Diagnosis present

## 2015-01-01 LAB — CBC WITH DIFFERENTIAL/PLATELET
Basophils Absolute: 0 10*3/uL (ref 0.0–0.1)
Basophils Relative: 0 % (ref 0–1)
EOS ABS: 0 10*3/uL (ref 0.0–0.7)
EOS PCT: 0 % (ref 0–5)
HCT: 39.3 % (ref 36.0–46.0)
HEMOGLOBIN: 13.5 g/dL (ref 12.0–15.0)
Lymphocytes Relative: 6 % — ABNORMAL LOW (ref 12–46)
Lymphs Abs: 0.8 10*3/uL (ref 0.7–4.0)
MCH: 30.9 pg (ref 26.0–34.0)
MCHC: 34.4 g/dL (ref 30.0–36.0)
MCV: 89.9 fL (ref 78.0–100.0)
MONOS PCT: 12 % (ref 3–12)
Monocytes Absolute: 1.7 10*3/uL — ABNORMAL HIGH (ref 0.1–1.0)
Neutro Abs: 11.8 10*3/uL — ABNORMAL HIGH (ref 1.7–7.7)
Neutrophils Relative %: 82 % — ABNORMAL HIGH (ref 43–77)
PLATELETS: 172 10*3/uL (ref 150–400)
RBC: 4.37 MIL/uL (ref 3.87–5.11)
RDW: 13.8 % (ref 11.5–15.5)
WBC: 14.4 10*3/uL — ABNORMAL HIGH (ref 4.0–10.5)

## 2015-01-01 LAB — I-STAT CHEM 8, ED
BUN: 20 mg/dL (ref 6–20)
CALCIUM ION: 1.01 mmol/L — AB (ref 1.13–1.30)
Chloride: 95 mmol/L — ABNORMAL LOW (ref 101–111)
Creatinine, Ser: 0.9 mg/dL (ref 0.44–1.00)
Glucose, Bld: 110 mg/dL — ABNORMAL HIGH (ref 65–99)
HCT: 43 % (ref 36.0–46.0)
HEMOGLOBIN: 14.6 g/dL (ref 12.0–15.0)
Potassium: 4.2 mmol/L (ref 3.5–5.1)
SODIUM: 127 mmol/L — AB (ref 135–145)
TCO2: 20 mmol/L (ref 0–100)

## 2015-01-01 LAB — URINALYSIS, ROUTINE W REFLEX MICROSCOPIC
BILIRUBIN URINE: NEGATIVE
Glucose, UA: NEGATIVE mg/dL
KETONES UR: NEGATIVE mg/dL
Nitrite: POSITIVE — AB
PH: 5.5 (ref 5.0–8.0)
Protein, ur: 30 mg/dL — AB
Specific Gravity, Urine: 1.006 (ref 1.005–1.030)
Urobilinogen, UA: 0.2 mg/dL (ref 0.0–1.0)

## 2015-01-01 LAB — COMPREHENSIVE METABOLIC PANEL
ALT: 28 U/L (ref 14–54)
AST: 39 U/L (ref 15–41)
Albumin: 3.3 g/dL — ABNORMAL LOW (ref 3.5–5.0)
Alkaline Phosphatase: 85 U/L (ref 38–126)
Anion gap: 10 (ref 5–15)
BUN: 18 mg/dL (ref 6–20)
CO2: 27 mmol/L (ref 22–32)
CREATININE: 0.89 mg/dL (ref 0.44–1.00)
Calcium: 8.7 mg/dL — ABNORMAL LOW (ref 8.9–10.3)
Chloride: 95 mmol/L — ABNORMAL LOW (ref 101–111)
GFR calc Af Amer: >60 mL/min (ref >60)
GFR, EST NON AFRICAN AMERICAN: 55 mL/min — AB (ref >60)
Glucose, Bld: 101 mg/dL — ABNORMAL HIGH (ref 65–99)
POTASSIUM: 4.1 mmol/L (ref 3.5–5.1)
Sodium: 132 mmol/L — ABNORMAL LOW (ref 135–145)
Total Bilirubin: 0.7 mg/dL (ref 0.3–1.2)
Total Protein: 6.3 g/dL — ABNORMAL LOW (ref 6.5–8.1)

## 2015-01-01 LAB — URINE MICROSCOPIC-ADD ON

## 2015-01-01 MED ORDER — CEFTRIAXONE SODIUM 1 G IJ SOLR
1.0000 g | Freq: Once | INTRAMUSCULAR | Status: AC
  Administered 2015-01-01: 1 g via INTRAMUSCULAR
  Filled 2015-01-01: qty 10

## 2015-01-01 MED ORDER — CEPHALEXIN 500 MG PO CAPS: 500.0000 mg | ORAL_CAPSULE | Freq: Three times a day (TID) | ORAL | Status: DC

## 2015-01-01 MED ORDER — DEXTROSE 5 % IV SOLN: 1.0000 g | INTRAVENOUS | Status: DC

## 2015-01-01 MED ORDER — SODIUM CHLORIDE 0.9 % IV SOLN: INTRAVENOUS | Status: DC

## 2015-01-01 MED ORDER — LIDOCAINE HCL (PF) 1 % IJ SOLN
INTRAMUSCULAR | Status: AC
  Filled 2015-01-01: qty 5

## 2015-01-01 NOTE — ED Notes (Signed)
Bed: WA15 Expected date:  Expected time:  Means of arrival:  Comments: 

## 2015-01-01 NOTE — Discharge Instructions (Signed)

## 2015-01-01 NOTE — ED Notes (Signed)
EMS reports pt wants to make sure she has an infection since she has a low grade fever, 98.9 oral, someone told her she may have an infection

## 2015-01-01 NOTE — ED Provider Notes (Signed)
CSN: [id number]     Arrival date & time 01/01/15  1421 History   First MD Initiated Contact with Patient 01/01/15 1526     Chief Complaint  Patient presents with  . Fever     (Consider location/radiation/quality/duration/timing/severity/associated sxs/prior Treatment) HPI Comments: Pt here with uri sx x 3 days--endorses slight cough without dyspnea Polyuria w/o dysuria, but that is nl per pt Reported fever of 101.3 yesterday, none today Denies ha,neck pain No abd or chest discomfort No meds used today  Patient is a 81 y.o. female presenting with fever. The history is provided by the patient.  Fever   Past Medical History  Diagnosis Date  . Arthritis   . Cancer     squamous cell  . Incontinence of urine   . Allergy   . Oxygen deficiency   . GERD (gastroesophageal reflux disease)   . Squamous cell carcinoma of skin   . Female stress incontinence   . Frequent loose stools   . TIA (transient ischemic attack) 01/05/13   Past Surgical History  Procedure Laterality Date  . Breast surgery  1982    breast  . Tonsillectomy  1940  . Abdominal hysterectomy  1959  . Tee without cardioversion N/A 01/18/2013    Procedure: TRANSESOPHAGEAL ECHOCARDIOGRAM (TEE);  Surgeon: Lelon Perla, MD;  Location: [place];  Service: Cardiovascular;  Laterality: N/A;  . Appendectomy  1959  . Colonoscopy  2013    Dr. Carol Ada   Family History  Problem Relation Age of Onset  . Heart disease Mother     failure  . Cancer Sister     breast  . Cancer Father   . Cancer Son     lung   Social History  Substance Use Topics  . Smoking status: Former Smoker -- 1.00 packs/day for 15 years    Types: Cigarettes    Quit date: 03/20/1977  . Smokeless tobacco: Never Used  . Alcohol Use: No   OB History    No data available     Review of Systems  Constitutional: Positive for fever.  All other systems reviewed and are negative.     Allergies  Penicillin g and Septra  Home  Medications   Prior to Admission medications   Medication Sig Start Date End Date Taking? Authorizing Provider  acetaminophen (TYLENOL) 500 MG tablet Take 500 mg by mouth every 6 (six) hours as needed for mild pain, moderate pain or headache.   Yes Historical Provider, MD  Ascorbic Acid (VITAMIN C) 1000 MG tablet Take 1,000 mg by mouth daily.   Yes Historical Provider, MD  aspirin EC 81 MG tablet Take 81 mg by mouth. Take one three times a week   Yes Historical Provider, MD  b complex vitamins tablet Take 1 tablet by mouth daily.   Yes Historical Provider, MD  beta carotene w/minerals (OCUVITE) tablet Take 1 tablet by mouth daily.   Yes Historical Provider, MD  Calcium Carbonate-Vitamin D (CALCIUM 600 + D PO) Take 1 tablet by mouth daily.   Yes Historical Provider, MD  diphenhydrAMINE (SOMINEX) 25 MG tablet Take 25 mg by mouth every 4 (four) hours as needed for allergies.   Yes Historical Provider, MD   BP 118/61 mmHg  Pulse 80  Temp(Src) 98.7 F (37.1 C) (Oral)  Resp 16  SpO2 96% Physical Exam  Constitutional: She is oriented to person, place, and time. She appears well-developed and well-nourished.  Non-toxic appearance. No distress.  HENT:  Head: Normocephalic  and atraumatic.  Eyes: Conjunctivae, EOM and lids are normal. Pupils are equal, round, and reactive to light.  Neck: Normal range of motion. Neck supple. No tracheal deviation present. No thyroid mass present.  Cardiovascular: Normal rate, regular rhythm and normal heart sounds.  Exam reveals no gallop.   No murmur heard. Pulmonary/Chest: Effort normal and breath sounds normal. No stridor. No respiratory distress. She has no decreased breath sounds. She has no wheezes. She has no rhonchi. She has no rales.  Abdominal: Soft. Normal appearance and bowel sounds are normal. She exhibits no distension. There is no tenderness. There is no rebound and no CVA tenderness.  Musculoskeletal: Normal range of motion. She exhibits no edema or  tenderness.  Neurological: She is alert and oriented to person, place, and time. She has normal strength. No cranial nerve deficit or sensory deficit. GCS eye subscore is 4. GCS verbal subscore is 5. GCS motor subscore is 6.  Skin: Skin is warm and dry. No abrasion and no rash noted.  Psychiatric: She has a normal mood and affect. Her speech is normal and behavior is normal.  Nursing note and vitals reviewed.   ED Course  Procedures (including critical care time) Labs Review Labs Reviewed - No data to display  Imaging Review No results found. I have personally reviewed and evaluated these images and lab results as part of my medical decision-making.   EKG Interpretation None      MDM   Final diagnoses:  None    Patient given Rocephin for UTI. Patient is aware that she has a pulmonary nodule and has seen a pulmonologist tomorrow for follow-up. Will be discharged home on Keflex    Lacretia Leigh, MD 01/01/15 [phone]

## 2015-01-02 ENCOUNTER — Inpatient Hospital Stay (HOSPITAL_COMMUNITY)
Admission: EM | Admit: 2015-01-02 | Discharge: 2015-01-05 | DRG: 872 | Disposition: A | Discharge status: Home / Self Care | Payer: Medicare Other | Attending: Internal Medicine | Admitting: Internal Medicine

## 2015-01-02 ENCOUNTER — Telehealth (HOSPITAL_COMMUNITY): Payer: Self-pay

## 2015-01-02 ENCOUNTER — Encounter (HOSPITAL_COMMUNITY): Payer: Self-pay

## 2015-01-02 ENCOUNTER — Ambulatory Visit: Payer: Medicare Other | Admitting: Internal Medicine

## 2015-01-02 DIAGNOSIS — R918 Other nonspecific abnormal finding of lung field: Secondary | ICD-10-CM | POA: Diagnosis present

## 2015-01-02 DIAGNOSIS — R7881 Bacteremia: Secondary | ICD-10-CM | POA: Diagnosis not present

## 2015-01-02 DIAGNOSIS — E860 Dehydration: Secondary | ICD-10-CM | POA: Diagnosis present

## 2015-01-02 DIAGNOSIS — Z8673 Personal history of transient ischemic attack (TIA), and cerebral infarction without residual deficits: Secondary | ICD-10-CM

## 2015-01-02 DIAGNOSIS — Z7982 Long term (current) use of aspirin: Secondary | ICD-10-CM

## 2015-01-02 DIAGNOSIS — E861 Hypovolemia: Secondary | ICD-10-CM | POA: Diagnosis present

## 2015-01-02 DIAGNOSIS — Z880 Allergy status to penicillin: Secondary | ICD-10-CM

## 2015-01-02 DIAGNOSIS — Z8249 Family history of ischemic heart disease and other diseases of the circulatory system: Secondary | ICD-10-CM | POA: Diagnosis not present

## 2015-01-02 DIAGNOSIS — Z79899 Other long term (current) drug therapy: Secondary | ICD-10-CM

## 2015-01-02 DIAGNOSIS — M1990 Unspecified osteoarthritis, unspecified site: Secondary | ICD-10-CM | POA: Diagnosis present

## 2015-01-02 DIAGNOSIS — N390 Urinary tract infection, site not specified: Secondary | ICD-10-CM | POA: Diagnosis present

## 2015-01-02 DIAGNOSIS — K219 Gastro-esophageal reflux disease without esophagitis: Secondary | ICD-10-CM | POA: Diagnosis present

## 2015-01-02 DIAGNOSIS — N393 Stress incontinence (female) (male): Secondary | ICD-10-CM | POA: Diagnosis present

## 2015-01-02 DIAGNOSIS — E871 Hypo-osmolality and hyponatremia: Secondary | ICD-10-CM | POA: Diagnosis present

## 2015-01-02 DIAGNOSIS — A4151 Sepsis due to Escherichia coli [E. coli]: Secondary | ICD-10-CM | POA: Diagnosis not present

## 2015-01-02 DIAGNOSIS — Z881 Allergy status to other antibiotic agents status: Secondary | ICD-10-CM | POA: Diagnosis not present

## 2015-01-02 DIAGNOSIS — Z803 Family history of malignant neoplasm of breast: Secondary | ICD-10-CM | POA: Diagnosis not present

## 2015-01-02 DIAGNOSIS — R5381 Other malaise: Secondary | ICD-10-CM | POA: Diagnosis not present

## 2015-01-02 DIAGNOSIS — R739 Hyperglycemia, unspecified: Secondary | ICD-10-CM | POA: Diagnosis not present

## 2015-01-02 DIAGNOSIS — Z801 Family history of malignant neoplasm of trachea, bronchus and lung: Secondary | ICD-10-CM

## 2015-01-02 LAB — COMPREHENSIVE METABOLIC PANEL
ALT: 28 U/L (ref 14–54)
AST: 35 U/L (ref 15–41)
Albumin: 3.3 g/dL — ABNORMAL LOW (ref 3.5–5.0)
Alkaline Phosphatase: 80 U/L (ref 38–126)
Anion gap: 10 (ref 5–15)
BUN: 27 mg/dL — ABNORMAL HIGH (ref 6–20)
CHLORIDE: 95 mmol/L — AB (ref 101–111)
CO2: 24 mmol/L (ref 22–32)
Calcium: 8.7 mg/dL — ABNORMAL LOW (ref 8.9–10.3)
Creatinine, Ser: 0.88 mg/dL (ref 0.44–1.00)
GFR calc non Af Amer: 56 mL/min — ABNORMAL LOW (ref >60)
Glucose, Bld: 113 mg/dL — ABNORMAL HIGH (ref 65–99)
Potassium: 4.3 mmol/L (ref 3.5–5.1)
SODIUM: 129 mmol/L — AB (ref 135–145)
Total Bilirubin: 0.6 mg/dL (ref 0.3–1.2)
Total Protein: 6.2 g/dL — ABNORMAL LOW (ref 6.5–8.1)

## 2015-01-02 LAB — CBC WITH DIFFERENTIAL/PLATELET
Basophils Absolute: 0 10*3/uL (ref 0.0–0.1)
Basophils Relative: 0 % (ref 0–1)
EOS PCT: 0 % (ref 0–5)
Eosinophils Absolute: 0 10*3/uL (ref 0.0–0.7)
HCT: 36.4 % (ref 36.0–46.0)
Hemoglobin: 13 g/dL (ref 12.0–15.0)
LYMPHS ABS: 0.6 10*3/uL — AB (ref 0.7–4.0)
Lymphocytes Relative: 4 % — ABNORMAL LOW (ref 12–46)
MCH: 31.6 pg (ref 26.0–34.0)
MCHC: 35.7 g/dL (ref 30.0–36.0)
MCV: 88.6 fL (ref 78.0–100.0)
MONOS PCT: 11 % (ref 3–12)
Monocytes Absolute: 1.7 10*3/uL — ABNORMAL HIGH (ref 0.1–1.0)
Neutro Abs: 12.4 10*3/uL — ABNORMAL HIGH (ref 1.7–7.7)
Neutrophils Relative %: 85 % — ABNORMAL HIGH (ref 43–77)
PLATELETS: 186 10*3/uL (ref 150–400)
RBC: 4.11 MIL/uL (ref 3.87–5.11)
RDW: 14 % (ref 11.5–15.5)
WBC: 14.7 10*3/uL — ABNORMAL HIGH (ref 4.0–10.5)

## 2015-01-02 LAB — I-STAT CG4 LACTIC ACID, ED: LACTIC ACID, VENOUS: 1.06 mmol/L (ref 0.5–2.0)

## 2015-01-02 MED ORDER — PIPERACILLIN-TAZOBACTAM 3.375 G IVPB: 3.3750 g | Freq: Once | INTRAVENOUS | Status: DC

## 2015-01-02 MED ORDER — ACETAMINOPHEN 325 MG PO TABS
650.0000 mg | ORAL_TABLET | Freq: Once | ORAL | Status: AC
  Administered 2015-01-02: 650 mg via ORAL
  Filled 2015-01-02: qty 2

## 2015-01-02 MED ORDER — DIPHENHYDRAMINE HCL 25 MG PO CAPS: 25.0000 mg | ORAL_CAPSULE | ORAL | Status: DC | PRN

## 2015-01-02 MED ORDER — SODIUM CHLORIDE 0.9 % IV SOLN
INTRAVENOUS | Status: DC
  Administered 2015-01-02 – 2015-01-04 (×2): via INTRAVENOUS

## 2015-01-02 MED ORDER — DIPHENHYDRAMINE HCL 25 MG PO CAPS
25.0000 mg | ORAL_CAPSULE | Freq: Once | ORAL | Status: AC
  Administered 2015-01-02: 25 mg via ORAL
  Filled 2015-01-02: qty 1

## 2015-01-02 MED ORDER — DEXTROSE 5 % IV SOLN
1.0000 g | Freq: Once | INTRAVENOUS | Status: AC
  Administered 2015-01-02: 1 g via INTRAVENOUS
  Filled 2015-01-02: qty 10

## 2015-01-02 MED ORDER — OCUVITE PO TABS
1.0000 | ORAL_TABLET | Freq: Every day | ORAL | Status: DC
  Administered 2015-01-03 – 2015-01-05 (×3): 1 via ORAL
  Filled 2015-01-02 (×3): qty 1

## 2015-01-02 MED ORDER — SODIUM CHLORIDE 0.9 % IV SOLN: Freq: Once | INTRAVENOUS | Status: DC

## 2015-01-02 MED ORDER — SODIUM CHLORIDE 0.9 % IV SOLN: INTRAVENOUS | Status: AC

## 2015-01-02 MED ORDER — POLYETHYLENE GLYCOL 3350 17 G PO PACK: 17.0000 g | PACK | Freq: Every day | ORAL | Status: DC | PRN

## 2015-01-02 MED ORDER — ACETAMINOPHEN 500 MG PO TABS
500.0000 mg | ORAL_TABLET | Freq: Four times a day (QID) | ORAL | Status: DC | PRN
  Administered 2015-01-03: 500 mg via ORAL
  Filled 2015-01-02: qty 1

## 2015-01-02 MED ORDER — POLYVINYL ALCOHOL 1.4 % OP SOLN
1.0000 [drp] | Freq: Two times a day (BID) | OPHTHALMIC | Status: DC
  Administered 2015-01-02 – 2015-01-05 (×5): 1 [drp] via OPHTHALMIC
  Filled 2015-01-02: qty 15

## 2015-01-02 MED ORDER — ALUM & MAG HYDROXIDE-SIMETH 200-200-20 MG/5ML PO SUSP
30.0000 mL | Freq: Four times a day (QID) | ORAL | Status: DC | PRN
  Administered 2015-01-03: 30 mL via ORAL
  Filled 2015-01-02: qty 30

## 2015-01-02 MED ORDER — ONDANSETRON HCL 4 MG/2ML IJ SOLN: 4.0000 mg | Freq: Four times a day (QID) | INTRAMUSCULAR | Status: DC | PRN

## 2015-01-02 MED ORDER — SODIUM CHLORIDE 0.9 % IV BOLUS (SEPSIS)
1000.0000 mL | Freq: Once | INTRAVENOUS | Status: AC
  Administered 2015-01-02: 1000 mL via INTRAVENOUS

## 2015-01-02 MED ORDER — VITAMIN C 500 MG PO TABS
1000.0000 mg | ORAL_TABLET | Freq: Every day | ORAL | Status: DC
  Administered 2015-01-03 – 2015-01-05 (×3): 1000 mg via ORAL
  Filled 2015-01-02 (×3): qty 2

## 2015-01-02 MED ORDER — ENOXAPARIN SODIUM 40 MG/0.4ML ~~LOC~~ SOLN: 40.0000 mg | SUBCUTANEOUS | Status: DC

## 2015-01-02 MED ORDER — ASPIRIN EC 81 MG PO TBEC
81.0000 mg | DELAYED_RELEASE_TABLET | Freq: Every day | ORAL | Status: DC
  Administered 2015-01-03 – 2015-01-05 (×3): 81 mg via ORAL
  Filled 2015-01-02 (×3): qty 1

## 2015-01-02 MED ORDER — SODIUM CHLORIDE 0.9 % IV SOLN
Freq: Once | INTRAVENOUS | Status: AC
  Administered 2015-01-02: 16:00:00 via INTRAVENOUS

## 2015-01-02 MED ORDER — ONDANSETRON HCL 4 MG PO TABS: 4.0000 mg | ORAL_TABLET | Freq: Four times a day (QID) | ORAL | Status: DC | PRN

## 2015-01-02 MED ORDER — B COMPLEX-C PO TABS
1.0000 | ORAL_TABLET | Freq: Every day | ORAL | Status: DC
  Administered 2015-01-03 – 2015-01-05 (×3): 1 via ORAL
  Filled 2015-01-02 (×3): qty 1

## 2015-01-02 MED ORDER — CALCIUM CARBONATE-VITAMIN D 500-200 MG-UNIT PO TABS
1.0000 | ORAL_TABLET | Freq: Every day | ORAL | Status: DC
Start: 2015-01-03 — End: 2015-01-05
  Administered 2015-01-03 – 2015-01-05 (×3): 1 via ORAL
  Filled 2015-01-02 (×3): qty 1

## 2015-01-02 MED ORDER — DEXTROSE 5 % IV SOLN
2.0000 g | INTRAVENOUS | Status: DC
  Administered 2015-01-03 – 2015-01-05 (×3): 2 g via INTRAVENOUS
  Filled 2015-01-02 (×3): qty 2

## 2015-01-02 NOTE — Telephone Encounter (Signed)
Chart reviewed by R Pollina. If better call PCP, if still sick, fever, etc return.  I contacted pt. Informed. She states that she is not feeling much better but not worse.  Has visit with rn today at 58 and follow up scheduled with PCP on Sept 22.  She verabilized that she would return if she was not better in another day. She requested that I contact the RN Geanie Logan at [phone]. I have left message for Tiffany to call me back.

## 2015-01-02 NOTE — ED Notes (Signed)
Patient states she came tot he Ed yesterday for allergies and low grade temperature. Patient states she was called from the hospital and told to to come to the ED because she had an infection in her blood.  Patient c/o weakness, "balance is off."

## 2015-01-02 NOTE — ED Notes (Signed)
Pt comes in today after being notified that she had a growth in her blood cultures. Pt was seen yesterday and given a Rx. Pt has not filled the Rx.

## 2015-01-02 NOTE — ED Provider Notes (Signed)
CSN: [id number]     Arrival date & time 01/02/15  1345 History   First MD Initiated Contact with Patient 01/02/15 1522     Chief Complaint  Patient presents with  . Abnormal Lab     (Consider location/radiation/quality/duration/timing/severity/associated sxs/prior Treatment) HPI [name] is a 81 y.o. female with history of GERD, TIA, arthritis, presents to emergency department complaining of generalized malaise. Patient was seen yesterday for fever. She was found to have UTI. She was given a gram of Rocephin IM, discharged with Keflex. She presents today back to emergency department after being called and told that her blood cultures returned positive for gram-negative rods. Patient states she has generalized weakness, she states she has no appetite, she has been eating or drinking since discharged yesterday. She denies any known fever at home since yesterday. She also has not filled her prescription for Keflex and has not had any yet. She denies any nausea vomiting. She denies any headache or neck pain or stiffness. She denies any abdominal pain. No diarrhea. No other specific complaints.  Past Medical History  Diagnosis Date  . Arthritis   . Cancer     squamous cell  . Incontinence of urine   . Allergy   . Oxygen deficiency   . GERD (gastroesophageal reflux disease)   . Squamous cell carcinoma of skin   . Female stress incontinence   . Frequent loose stools   . TIA (transient ischemic attack) 01/05/13   Past Surgical History  Procedure Laterality Date  . Breast surgery  1982    breast  . Tonsillectomy  1940  . Abdominal hysterectomy  1959  . Tee without cardioversion N/A 01/18/2013    Procedure: TRANSESOPHAGEAL ECHOCARDIOGRAM (TEE);  Surgeon: Lelon Perla, MD;  Location: [facility];  Service: Cardiovascular;  Laterality: N/A;  . Appendectomy  1959  . Colonoscopy  2013    Dr. Carol Ada   Family History  Problem Relation Age of Onset  . Heart disease Mother      failure  . Cancer Sister     breast  . Cancer Father   . Cancer Son     lung   Social History  Substance Use Topics  . Smoking status: Former Smoker -- 1.00 packs/day for 15 years    Types: Cigarettes    Quit date: 03/20/1977  . Smokeless tobacco: Never Used  . Alcohol Use: No   OB History    No data available     Review of Systems  Constitutional: Positive for fever, appetite change and fatigue. Negative for chills.  Respiratory: Negative for cough, chest tightness and shortness of breath.   Cardiovascular: Negative for chest pain, palpitations and leg swelling.  Gastrointestinal: Positive for nausea. Negative for vomiting, abdominal pain and diarrhea.  Genitourinary: Negative for dysuria, flank pain and pelvic pain.  Musculoskeletal: Negative for myalgias, arthralgias, neck pain and neck stiffness.  Skin: Negative for rash.  Neurological: Positive for weakness. Negative for dizziness and headaches.  All other systems reviewed and are negative.     Allergies  Penicillin g and Septra  Home Medications   Prior to Admission medications   Medication Sig Start Date End Date Taking? Authorizing Provider  acetaminophen (TYLENOL) 500 MG tablet Take 500 mg by mouth every 6 (six) hours as needed for mild pain, moderate pain or headache.    Historical Provider, MD  Ascorbic Acid (VITAMIN C) 1000 MG tablet Take 1,000 mg by mouth daily.    Historical Provider,  MD  aspirin EC 81 MG tablet Take 81 mg by mouth. Take one three times a week    Historical Provider, MD  b complex vitamins tablet Take 1 tablet by mouth daily.    Historical Provider, MD  beta carotene w/minerals (OCUVITE) tablet Take 1 tablet by mouth daily.    Historical Provider, MD  Calcium Carbonate-Vitamin D (CALCIUM 600 + D PO) Take 1 tablet by mouth daily.    Historical Provider, MD  cephALEXin (KEFLEX) 500 MG capsule Take 1 capsule (500 mg total) by mouth 3 (three) times daily. 01/01/15   Lacretia Leigh, MD   diphenhydrAMINE (SOMINEX) 25 MG tablet Take 25 mg by mouth every 4 (four) hours as needed for allergies.    Historical Provider, MD   BP 108/60 mmHg  Pulse 91  Temp(Src) 99.5 F (37.5 C) (Oral)  Resp 19  SpO2 96% Physical Exam  Constitutional: She is oriented to person, place, and time. She appears well-developed and well-nourished. No distress.  HENT:  Head: Normocephalic.  Eyes: Conjunctivae are normal.  Neck: Neck supple.  Cardiovascular: Normal rate, regular rhythm and normal heart sounds.   Pulmonary/Chest: Effort normal and breath sounds normal. No respiratory distress. She has no wheezes. She has no rales.  Abdominal: Soft. Bowel sounds are normal. She exhibits no distension. There is no tenderness. There is no rebound.  Musculoskeletal: She exhibits no edema.  Neurological: She is alert and oriented to person, place, and time.  Skin: Skin is warm and dry.  Psychiatric: She has a normal mood and affect. Her behavior is normal.  Nursing note and vitals reviewed.   ED Course  Procedures (including critical care time) Labs Review Labs Reviewed  CBC WITH DIFFERENTIAL/PLATELET - Abnormal; Notable for the following:    WBC 14.7 (*)    Neutrophils Relative % 85 (*)    Neutro Abs 12.4 (*)    Lymphocytes Relative 4 (*)    Lymphs Abs 0.6 (*)    Monocytes Absolute 1.7 (*)    All other components within normal limits  COMPREHENSIVE METABOLIC PANEL - Abnormal; Notable for the following:    Sodium 129 (*)    Chloride 95 (*)    Glucose, Bld 113 (*)    BUN 27 (*)    Calcium 8.7 (*)    Total Protein 6.2 (*)    Albumin 3.3 (*)    GFR calc non Af Amer 56 (*)    All other components within normal limits  I-STAT CG4 LACTIC ACID, ED    Imaging Review Dg Chest 2 View  01/01/2015   CLINICAL DATA:  Shortness of breath.  EXAM: CHEST  2 VIEW  COMPARISON:  July 05, 2014.  FINDINGS: The heart size and mediastinal contours are within normal limits. No pneumothorax or pleural effusion  is noted. Stable granuloma is noted in right midlung. Probable new enlarged nodule seen in the left perihilar region. Multilevel degenerative disc disease is noted in the lower thoracic spine. Stable mild compression deformity of mid thoracic vertebral body is noted.  IMPRESSION: Probable new enlarged nodule seen in the left perihilar region; CT scan of the chest with contrast administration is recommended for further evaluation.   Electronically Signed   By: Marijo Conception, M.D.   On: 01/01/2015 16:09   I have personally reviewed and evaluated these images and lab results as part of my medical decision-making.   EKG Interpretation None      MDM   Final diagnoses:  Bacteremia  UTI (lower urinary tract infection)   Pt in ED after her blood cultures grew gram negative rods. Pt was diagnosed with UTI yesterday which grew ecoli. Will start fluids. Rocephin ordered. Will repeat labs.   Pt with no evidence of sepsis. Normal VS in ED. Normal mentation. Pt reports loss of appetite and weakness. Will admit for IV antibiotics. Pt receiving fluids and rocephin.   Spoke with [facility]. Will admit.   Filed Vitals:   01/02/15 1433 01/02/15 1554 01/02/15 1600  BP: 108/60 133/56 133/56  Pulse: 91 91 92  Temp: 99.5 F (37.5 C) 99.9 F (37.7 C)   TempSrc: Oral Rectal   Resp: 19 18   SpO2: 96% 96% 95%     Jeannett Senior, PA-C 01/02/15 1800  Lacretia Leigh, MD 01/02/15 2347

## 2015-01-02 NOTE — H&P (Signed)
History and Physical:    [name]   QZR:[id number] DOB: 10/16/1922 DOA: 01/02/2015  Referring MD/provider: Dr. Madison Hickman PCP: Estill Dooms, MD   Chief Complaint: General malaise/GNR blood cultures.  History of Present Illness:   [name] is an 81 y.o. female with a PMH of stress incontinence and osteoarthritis who lives at an ALF and stays quite active who was originally seen in the ED on 01/01/15 for evaluation of polyuria and fever of 101.3. Urinalysis was consistent with a UTI so the patient was given an IM dose of Rocephin and discharged home on Keflex. Blood cultures were also drawn at that time and subsequently were positive for GNR and therefore the patient was advised to return to the ED. Since discharge, the patient reports severe malaise/fatigue. She also states that she does not have any appetite and has to force herself to eat only a few bites of food. No vomiting. She has been symptomatic for approximately 3 days, mainly loss of appetite and generalized malaise and fatigue. She has taken Tylenol without any relief. No aggravating or alleviating factors.   ROS:   Review of Systems  Constitutional: Positive for fever, chills and malaise/fatigue. Negative for weight loss.  HENT: Positive for hearing loss.   Eyes: Negative.   Respiratory: Negative for cough, sputum production, shortness of breath and wheezing.   Cardiovascular: Negative for chest pain and palpitations.  Gastrointestinal: Positive for nausea and diarrhea. Negative for vomiting, abdominal pain, blood in stool and melena.  Genitourinary: Positive for dysuria and frequency.  Musculoskeletal: Positive for back pain and joint pain.  Skin: Negative.   Neurological: Positive for dizziness and weakness. Negative for loss of consciousness.  Endo/Heme/Allergies: Positive for polydipsia.  Psychiatric/Behavioral: Negative.      Past Medical History:   Past Medical History  Diagnosis Date  .  Arthritis   . Cancer     squamous cell  . Incontinence of urine   . Allergy   . Oxygen deficiency   . GERD (gastroesophageal reflux disease)   . Squamous cell carcinoma of skin   . Female stress incontinence   . Frequent loose stools   . TIA (transient ischemic attack) 01/05/13    Past Surgical History:   Past Surgical History  Procedure Laterality Date  . Breast surgery  1982    breast  . Tonsillectomy  1940  . Abdominal hysterectomy  1959  . Tee without cardioversion N/A 01/18/2013    Procedure: TRANSESOPHAGEAL ECHOCARDIOGRAM (TEE);  Surgeon: Lelon Perla, MD;  Location: [facility];  Service: Cardiovascular;  Laterality: N/A;  . Appendectomy  1959  . Colonoscopy  2013    Dr. Carol Ada    Social History:   Social History   Social History  . Marital Status: Widowed    Spouse Name: N/A  . Number of Children: N/A  . Years of Education: N/A   Occupational History  . Not on file.   Social History Main Topics  . Smoking status: Former Smoker -- 1.00 packs/day for 15 years    Types: Cigarettes    Quit date: 03/20/1977  . Smokeless tobacco: Never Used  . Alcohol Use: No  . Drug Use: No  . Sexual Activity: No   Other Topics Concern  . Not on file   Social History Narrative   Lives at [facility] for 30 years stopped 1978   Alcohol none   Exercise 3 days  a week   Living Will    Family history:   Family History  Problem Relation Age of Onset  . Heart disease Mother     failure  . Cancer Sister     breast  . Cancer Father   . Cancer Son     lung    Allergies   Penicillin g and Septra  Current Medications:   Prior to Admission medications   Medication Sig Start Date End Date Taking? Authorizing Provider  acetaminophen (TYLENOL) 500 MG tablet Take 500 mg by mouth every 6 (six) hours as needed for mild pain, moderate pain or headache.   Yes Historical Provider, MD  Ascorbic Acid (VITAMIN C) 1000 MG tablet Take  1,000 mg by mouth daily.   Yes Historical Provider, MD  aspirin EC 81 MG tablet Take 81 mg by mouth. Take one three times a week   Yes Historical Provider, MD  b complex vitamins tablet Take 1 tablet by mouth daily.   Yes Historical Provider, MD  beta carotene w/minerals (OCUVITE) tablet Take 1 tablet by mouth daily.   Yes Historical Provider, MD  Calcium Carbonate-Vitamin D (CALCIUM 600 + D PO) Take 1 tablet by mouth daily.   Yes Historical Provider, MD  diphenhydrAMINE (SOMINEX) 25 MG tablet Take 25 mg by mouth every 4 (four) hours as needed for allergies.   Yes Historical Provider, MD  loperamide (IMODIUM) 2 MG capsule Take 2 mg by mouth as needed for diarrhea or loose stools.   Yes Historical Provider, MD  Polyethyl Glycol-Propyl Glycol (SYSTANE OP) Apply 1 drop to eye 2 (two) times daily.   Yes Historical Provider, MD  cephALEXin (KEFLEX) 500 MG capsule Take 1 capsule (500 mg total) by mouth 3 (three) times daily. 01/01/15   Lacretia Leigh, MD    Physical Exam:   Filed Vitals:   01/02/15 1433 01/02/15 1554 01/02/15 1600  BP: 108/60 133/56 133/56  Pulse: 91 91 92  Temp: 99.5 F (37.5 C) 99.9 F (37.7 C)   TempSrc: Oral Rectal   Resp: 19 18   SpO2: 96% 96% 95%     Physical Exam: Blood pressure 133/56, pulse 92, temperature 99.9 F (37.7 C), temperature source Rectal, resp. rate 18, SpO2 95 %. Gen: No acute distress. Head: Normocephalic, atraumatic. Eyes: PERRL, EOMI, sclerae nonicteric. Mouth: Oropharynx clear with moist mucous membranes. Neck: Supple, no thyromegaly, no lymphadenopathy, no jugular venous distention. Chest: Lungs clear to auscultation bilaterally with good air movement. CV: Heart sounds are regular. No murmurs, rubs, or gallops. Abdomen: Soft, nontender, nondistended with normal active bowel sounds. Extremities: Extremities are without clubbing, edema, or cyanosis. Skin: Warm and dry. Neuro: Alert and oriented times 3; nonfocal. Psych: Mood and affect  normal.   Data Review:    Labs: Basic Metabolic Panel:  Recent Labs Lab 01/01/15 1627 01/01/15 1735 01/02/15 1547  NA 127* 132* 129*  K 4.2 4.1 4.3  CL 95* 95* 95*  CO2  --  27 24  GLUCOSE 110* 101* 113*  BUN 20 18 27*  CREATININE 0.90 0.89 0.88  CALCIUM  --  8.7* 8.7*   Liver Function Tests:  Recent Labs Lab 01/01/15 1735 01/02/15 1547  AST 39 35  ALT 28 28  ALKPHOS 85 80  BILITOT 0.7 0.6  PROT 6.3* 6.2*  ALBUMIN 3.3* 3.3*   CBC:  Recent Labs Lab 01/01/15 1554 01/01/15 1627 01/02/15 1547  WBC 14.4*  --  14.7*  NEUTROABS 11.8*  --  12.4*  HGB  13.5 14.6 13.0  HCT 39.3 43.0 36.4  MCV 89.9  --  88.6  PLT 172  --  186   Radiographic Studies: Dg Chest 2 View  01/01/2015   CLINICAL DATA:  Shortness of breath.  EXAM: CHEST  2 VIEW  COMPARISON:  July 05, 2014.  FINDINGS: The heart size and mediastinal contours are within normal limits. No pneumothorax or pleural effusion is noted. Stable granuloma is noted in right midlung. Probable new enlarged nodule seen in the left perihilar region. Multilevel degenerative disc disease is noted in the lower thoracic spine. Stable mild compression deformity of mid thoracic vertebral body is noted.  IMPRESSION: Probable new enlarged nodule seen in the left perihilar region; CT scan of the chest with contrast administration is recommended for further evaluation.   Electronically Signed   By: Marijo Conception, M.D.   On: 01/01/2015 16:09   *I have personally reviewed the images above*  EKG: None performed.   Assessment/Plan:   Principal Problem:   Bacteremia / UTI - Follow-up final speciation/sensitivities from blood culture which is growing GNR. - Continue empiric Rocephin.  Active Problems:   Urine, incontinence, stress female - Perineal care per nursing.    Multiple pulmonary nodules - Being actively followed by pulmonologist. No further inpatient workup needed.    Hyperglycemia - Mild. Hemoglobin A1c 6% on  06/14/14.    Hyponatremia - Likely related to dehydration. Hydrate and monitor.    DVT prophylaxis - Lovenox ordered.  Code Status: Full. Family Communication: Nursing friend at bedside. Disposition Plan: From [facility]. Likely will be stable to return there in 2-3 days.  Time spent: One hour.  Roosvelt Churchwell [facility] Pager [phone] Cell: [phone]   If 7PM-7AM, please contact night-coverage [email or web address] Password [facility] 01/02/2015, 5:58 PM

## 2015-01-02 NOTE — Progress Notes (Signed)
Utilization Review completed.  Rajah Tagliaferro RN CM  

## 2015-01-03 LAB — BASIC METABOLIC PANEL
Anion gap: 7 (ref 5–15)
BUN: 22 mg/dL — AB (ref 6–20)
CALCIUM: 8 mg/dL — AB (ref 8.9–10.3)
CHLORIDE: 103 mmol/L (ref 101–111)
CO2: 24 mmol/L (ref 22–32)
CREATININE: 0.92 mg/dL (ref 0.44–1.00)
GFR calc non Af Amer: 53 mL/min — ABNORMAL LOW (ref >60)
Glucose, Bld: 90 mg/dL (ref 65–99)
Potassium: 4.1 mmol/L (ref 3.5–5.1)
SODIUM: 134 mmol/L — AB (ref 135–145)

## 2015-01-03 LAB — URINE CULTURE: Culture: >=100000

## 2015-01-03 MED ORDER — ENOXAPARIN SODIUM 30 MG/0.3ML ~~LOC~~ SOLN
30.0000 mg | Freq: Every day | SUBCUTANEOUS | Status: DC
  Administered 2015-01-03 – 2015-01-05 (×3): 30 mg via SUBCUTANEOUS
  Filled 2015-01-03 (×3): qty 0.3

## 2015-01-03 MED ORDER — ENOXAPARIN SODIUM 40 MG/0.4ML ~~LOC~~ SOLN: 40.0000 mg | SUBCUTANEOUS | Status: DC

## 2015-01-03 NOTE — Progress Notes (Signed)
Rx Brief Note:  Lovenox Wt=45 kg CrCl~42 ml/min Rx adjusted Lovenox to 30mg  daily in pt with wt<45 kg  Dorrene German 01/03/2015 7:08 AM

## 2015-01-03 NOTE — Progress Notes (Signed)
[facility] PROGRESS NOTE  [name] JKK:[id number] DOB: [birth year] DOA: 01/02/2015 PCP: Estill Dooms, MD  Assessment/Plan: 1. Bacteremia- 2 out of 2 bottles of blood culture growing Escherichia coli, since today's pending. Continue empiric Rocephin 2. UTI- urine culture growing Escherichia coli sensitive to Rocephin. 3. Multiple pulmonary nodules- patient followed by pulmonologist. Will follow-up as outpatient 4. Hyponatremia- today sodium 134, improving with IV fluids. 5. DVT prophylaxis- Lovenox  Code Status:  full code  Family Communication: no family present at bedside  Disposition Plan:home when blood culture results are back     Consultants  None  Procedures:  None  Antibiotics:  Ceftriaxone  HPI/Subjective: 81 y.o. female with a PMH of stress incontinence and osteoarthritis who lives at an ALF and stays quite active who was originally seen in the ED on 01/01/15 for evaluation of polyuria and fever of 101.3. Urinalysis was consistent with a UTI so the patient was given an IM dose of Rocephin and discharged home on Keflex. Blood cultures were also drawn at that time and subsequently were positive for GNR and therefore the patient was advised to return to the ED. Since discharge, the patient reports severe malaise/fatigue. She also states that she does not have any appetite and has to force herself to eat only a few bites of food.  Patient denies any complaints this morning. No chest pain no shortness of breath. Denies dysuria.   Objective: Filed Vitals:   01/03/15 0542  BP: 134/63  Pulse: 86  Temp: 97.6 F (36.4 C)  Resp: 18    Intake/Output Summary (Last 24 hours) at 01/03/15 1520 Last data filed at 01/03/15 0600  Gross per 24 hour  Intake 838.75 ml  Output      0 ml  Net 838.75 ml   There were no vitals filed for this visit.  Exam:   General:  appears in no acute distress   Cardiovascular: S1-S2 regular   Respiratory: clear to  auscultation bilaterally, no wheezing or crackles   Abdomen - soft, nontender, no organomegaly   Musculoskeletal:  no edema of the lower extremities   Data Reviewed: Basic Metabolic Panel:  Recent Labs Lab 01/01/15 1627 01/01/15 1735 01/02/15 1547 01/03/15 0604  NA 127* 132* 129* 134*  K 4.2 4.1 4.3 4.1  CL 95* 95* 95* 103  CO2  --  27 24 24   GLUCOSE 110* 101* 113* 90  BUN 20 18 27* 22*  CREATININE 0.90 0.89 0.88 0.92  CALCIUM  --  8.7* 8.7* 8.0*   Liver Function Tests:  Recent Labs Lab 01/01/15 1735 01/02/15 1547  AST 39 35  ALT 28 28  ALKPHOS 85 80  BILITOT 0.7 0.6  PROT 6.3* 6.2*  ALBUMIN 3.3* 3.3*   CBC:  Recent Labs Lab 01/01/15 1554 01/01/15 1627 01/02/15 1547  WBC 14.4*  --  14.7*  NEUTROABS 11.8*  --  12.4*  HGB 13.5 14.6 13.0  HCT 39.3 43.0 36.4  MCV 89.9  --  88.6  PLT 172  --  186   C CBG: No results for input(s): GLUCAP in the last 168 hours.  Recent Results (from the past 240 hour(s))  Urine culture     Status: None   Collection Time: 01/01/15  3:50 PM  Result Value Ref Range Status   Specimen Description URINE, CLEAN CATCH  Final   Special Requests NONE  Final   Culture   Final    >=100,000 COLONIES/mL ESCHERICHIA COLI Performed at [facility]  Report Status 01/03/2015 FINAL  Final   Organism ID, Bacteria ESCHERICHIA COLI  Final      Susceptibility   Escherichia coli - MIC*    AMPICILLIN <=2 SENSITIVE Sensitive     CEFAZOLIN <=4 SENSITIVE Sensitive     CEFTRIAXONE <=1 SENSITIVE Sensitive     CIPROFLOXACIN >=4 RESISTANT Resistant     GENTAMICIN <=1 SENSITIVE Sensitive     IMIPENEM <=0.25 SENSITIVE Sensitive     NITROFURANTOIN <=16 SENSITIVE Sensitive     TRIMETH/SULFA <=20 SENSITIVE Sensitive     AMPICILLIN/SULBACTAM <=2 SENSITIVE Sensitive     PIP/TAZO <=4 SENSITIVE Sensitive     * >=100,000 COLONIES/mL ESCHERICHIA COLI  Culture, blood (routine x 2)     Status: None (Preliminary result)   Collection Time:  01/01/15  4:18 PM  Result Value Ref Range Status   Specimen Description BLOOD LEFT ANTECUBITAL  Final   Special Requests BOTTLES DRAWN AEROBIC AND ANAEROBIC 5CC  Final   Culture  Setup Time   Final    GRAM NEGATIVE RODS IN BOTH AEROBIC AND ANAEROBIC BOTTLES CRITICAL RESULT CALLED TO, READ BACK BY AND VERIFIED WITH: Marijean Heath AT 6381 01/02/15 BY L BENFIELD    Culture   Final    ESCHERICHIA COLI Performed at [facility]    Report Status PENDING  Incomplete  Culture, blood (routine x 2)     Status: None (Preliminary result)   Collection Time: 01/01/15  4:19 PM  Result Value Ref Range Status   Specimen Description BLOOD LEFT HAND  Final   Special Requests BOTTLES DRAWN AEROBIC AND ANAEROBIC 5CC EACH  Final   Culture  Setup Time   Final    GRAM NEGATIVE RODS IN BOTH AEROBIC AND ANAEROBIC BOTTLES CRITICAL RESULT CALLED TO, READ BACK BY AND VERIFIED WITH: T FESTERMAN,RN AT 1035 01/02/15 BY L BENFIELD    Culture   Final    ESCHERICHIA COLI Performed at [facility]    Report Status PENDING  Incomplete     Studies: Dg Chest 2 View  01/01/2015   CLINICAL DATA:  Shortness of breath.  EXAM: CHEST  2 VIEW  COMPARISON:  July 05, 2014.  FINDINGS: The heart size and mediastinal contours are within normal limits. No pneumothorax or pleural effusion is noted. Stable granuloma is noted in right midlung. Probable new enlarged nodule seen in the left perihilar region. Multilevel degenerative disc disease is noted in the lower thoracic spine. Stable mild compression deformity of mid thoracic vertebral body is noted.  IMPRESSION: Probable new enlarged nodule seen in the left perihilar region; CT scan of the chest with contrast administration is recommended for further evaluation.   Electronically Signed   By: Marijo Conception, M.D.   On: 01/01/2015 16:09    Scheduled Meds: . aspirin EC  81 mg Oral Daily  . B-complex with vitamin C  1 tablet Oral Daily  . beta carotene w/minerals  1  tablet Oral Daily  . calcium-vitamin D  1 tablet Oral Daily  . cefTRIAXone (ROCEPHIN)  IV  2 g Intravenous Q24H  . enoxaparin (LOVENOX) injection  30 mg Subcutaneous Daily  . polyvinyl alcohol  1 drop Both Eyes BID  . vitamin C  1,000 mg Oral Daily   Continuous Infusions: . sodium chloride 75 mL/hr at 01/02/15 1849    Principal Problem:   Bacteremia Active Problems:   Urine, incontinence, stress female   Multiple pulmonary nodules   Hyperglycemia   UTI (lower urinary tract  infection)   Hyponatremia    Time spent: 25 min    [facility] Pager [phone]. If 7PM-7AM, please contact night-coverage at [email or web address], password [facility] 01/03/2015, 3:20 PM  LOS: 1 day

## 2015-01-03 NOTE — Progress Notes (Signed)
Per chart review, patient from an ALF. CSW spoke with facility who confirmed she is independent. CSW to sign off. Please consult if further CSW services arise.   Lucius Conn, [place] Social Worker [place] [phone]

## 2015-01-04 DIAGNOSIS — E871 Hypo-osmolality and hyponatremia: Secondary | ICD-10-CM

## 2015-01-04 DIAGNOSIS — R918 Other nonspecific abnormal finding of lung field: Secondary | ICD-10-CM

## 2015-01-04 DIAGNOSIS — R739 Hyperglycemia, unspecified: Secondary | ICD-10-CM

## 2015-01-04 DIAGNOSIS — N390 Urinary tract infection, site not specified: Secondary | ICD-10-CM

## 2015-01-04 DIAGNOSIS — R7881 Bacteremia: Secondary | ICD-10-CM

## 2015-01-04 DIAGNOSIS — A4151 Sepsis due to Escherichia coli [E. coli]: Secondary | ICD-10-CM

## 2015-01-04 LAB — CULTURE, BLOOD (ROUTINE X 2)

## 2015-01-04 LAB — COMPREHENSIVE METABOLIC PANEL
ALBUMIN: 2.2 g/dL — AB (ref 3.5–5.0)
ALK PHOS: 79 U/L (ref 38–126)
ALT: 36 U/L (ref 14–54)
ANION GAP: 7 (ref 5–15)
AST: 32 U/L (ref 15–41)
BILIRUBIN TOTAL: 0.5 mg/dL (ref 0.3–1.2)
BUN: 16 mg/dL (ref 6–20)
CALCIUM: 7.7 mg/dL — AB (ref 8.9–10.3)
CO2: 23 mmol/L (ref 22–32)
Chloride: 105 mmol/L (ref 101–111)
Creatinine, Ser: 0.73 mg/dL (ref 0.44–1.00)
GFR calc Af Amer: >60 mL/min (ref >60)
GFR calc non Af Amer: >60 mL/min (ref >60)
GLUCOSE: 86 mg/dL (ref 65–99)
Potassium: 3.8 mmol/L (ref 3.5–5.1)
SODIUM: 135 mmol/L (ref 135–145)
TOTAL PROTEIN: 4.7 g/dL — AB (ref 6.5–8.1)

## 2015-01-04 LAB — CBC
HCT: 34.1 % — ABNORMAL LOW (ref 36.0–46.0)
Hemoglobin: 11.5 g/dL — ABNORMAL LOW (ref 12.0–15.0)
MCH: 30.6 pg (ref 26.0–34.0)
MCHC: 33.7 g/dL (ref 30.0–36.0)
MCV: 90.7 fL (ref 78.0–100.0)
PLATELETS: 173 10*3/uL (ref 150–400)
RBC: 3.76 MIL/uL — ABNORMAL LOW (ref 3.87–5.11)
RDW: 14.6 % (ref 11.5–15.5)
WBC: 9.3 10*3/uL (ref 4.0–10.5)

## 2015-01-04 MED ORDER — LIP MEDEX EX OINT
TOPICAL_OINTMENT | CUTANEOUS | Status: AC
  Administered 2015-01-04: 18:00:00
  Filled 2015-01-04: qty 7

## 2015-01-04 MED ORDER — LIP MEDEX EX OINT: TOPICAL_OINTMENT | CUTANEOUS | Status: DC | PRN

## 2015-01-04 NOTE — Progress Notes (Signed)
[facility] PROGRESS NOTE Assessment/Plan: Sepsis due to E. Coli Bacteremia: -2 2 blood cultures grew Escherichia coli, the patient has defervesced leukocytosis has resolved she continues to be on empiric IV Rocephin.  UTI (lower urinary tract infection) - Continue IV Rocephin and sensitivities are pending.  Hyponatremia Likely due to sepsis now resolved.  Hyperglycemia: - Blood glucose well controlled.  Multiple pulmonary nodules Follow-up with pulmonary as an outpatient.  Code Status: full code  Family Communication: no family present at bedside  Disposition Plan:home when blood culture results are back    Consultants:  none  Procedures:  none  Antibiotics:  Rocephin  HPI/Subjective: No complaints she feels great once go home.  Objective: Filed Vitals:   01/03/15 1809 01/03/15 2220 01/04/15 0000 01/04/15 0607  BP: 128/59 132/75  112/59  Pulse: 93 84  89  Temp: 98.3 F (36.8 C) 98.1 F (36.7 C)  98 F (36.7 C)  TempSrc: Oral Oral  Oral  Resp: 18 18  18   Height:   5\' 1"  (1.549 m)   Weight:   45.36 kg (100 lb)   SpO2: 97% 93%  93%    Intake/Output Summary (Last 24 hours) at 01/04/15 1245 Last data filed at 01/04/15 0900  Gross per 24 hour  Intake   2430 ml  Output      0 ml  Net   2430 ml   Filed Weights   01/04/15 0000  Weight: 45.36 kg (100 lb)    Exam:  General: Alert, awake, oriented x3, in no acute distress.  HEENT: No bruits, no goiter.  Heart: Regular rate and rhythm. Lungs: Good air movement, clear Abdomen: Soft, nontender, nondistended, positive bowel sounds.  Neuro: Grossly intact, nonfocal.   Data Reviewed: Basic Metabolic Panel:  Recent Labs Lab 01/01/15 1627 01/01/15 1735 01/02/15 1547 01/03/15 0604 01/04/15 0555  NA 127* 132* 129* 134* 135  K 4.2 4.1 4.3 4.1 3.8  CL 95* 95* 95* 103 105  CO2  --  27 24 24 23   GLUCOSE 110* 101* 113* 90 86  BUN 20 18 27* 22* 16  CREATININE 0.90 0.89 0.88 0.92 0.73    CALCIUM  --  8.7* 8.7* 8.0* 7.7*   Liver Function Tests:  Recent Labs Lab 01/01/15 1735 01/02/15 1547 01/04/15 0555  AST 39 35 32  ALT 28 28 36  ALKPHOS 85 80 79  BILITOT 0.7 0.6 0.5  PROT 6.3* 6.2* 4.7*  ALBUMIN 3.3* 3.3* 2.2*   No results for input(s): LIPASE, AMYLASE in the last 168 hours. No results for input(s): AMMONIA in the last 168 hours. CBC:  Recent Labs Lab 01/01/15 1554 01/01/15 1627 01/02/15 1547 01/04/15 0555  WBC 14.4*  --  14.7* 9.3  NEUTROABS 11.8*  --  12.4*  --   HGB 13.5 14.6 13.0 11.5*  HCT 39.3 43.0 36.4 34.1*  MCV 89.9  --  88.6 90.7  PLT 172  --  186 173   Cardiac Enzymes: No results for input(s): CKTOTAL, CKMB, CKMBINDEX, TROPONINI in the last 168 hours. BNP (last 3 results) No results for input(s): BNP in the last 8760 hours.  ProBNP (last 3 results) No results for input(s): PROBNP in the last 8760 hours.  CBG: No results for input(s): GLUCAP in the last 168 hours.  Recent Results (from the past 240 hour(s))  Urine culture     Status: None   Collection Time: 01/01/15  3:50 PM  Result Value Ref Range Status   Specimen Description URINE,  CLEAN CATCH  Final   Special Requests NONE  Final   Culture   Final    >=100,000 COLONIES/mL ESCHERICHIA COLI Performed at [facility]    Report Status 01/03/2015 FINAL  Final   Organism ID, Bacteria ESCHERICHIA COLI  Final      Susceptibility   Escherichia coli - MIC*    AMPICILLIN <=2 SENSITIVE Sensitive     CEFAZOLIN <=4 SENSITIVE Sensitive     CEFTRIAXONE <=1 SENSITIVE Sensitive     CIPROFLOXACIN >=4 RESISTANT Resistant     GENTAMICIN <=1 SENSITIVE Sensitive     IMIPENEM <=0.25 SENSITIVE Sensitive     NITROFURANTOIN <=16 SENSITIVE Sensitive     TRIMETH/SULFA <=20 SENSITIVE Sensitive     AMPICILLIN/SULBACTAM <=2 SENSITIVE Sensitive     PIP/TAZO <=4 SENSITIVE Sensitive     * >=100,000 COLONIES/mL ESCHERICHIA COLI  Culture, blood (routine x 2)     Status: None   Collection Time:  01/01/15  4:18 PM  Result Value Ref Range Status   Specimen Description BLOOD LEFT ANTECUBITAL  Final   Special Requests BOTTLES DRAWN AEROBIC AND ANAEROBIC 5CC  Final   Culture  Setup Time   Final    GRAM NEGATIVE RODS IN BOTH AEROBIC AND ANAEROBIC BOTTLES CRITICAL RESULT CALLED TO, READ BACK BY AND VERIFIED WITH: Marijean Heath AT 2836 01/02/15 BY L BENFIELD    Culture   Final    ESCHERICHIA COLI Performed at [facility]    Report Status 01/04/2015 FINAL  Final   Organism ID, Bacteria ESCHERICHIA COLI  Final      Susceptibility   Escherichia coli - MIC*    AMPICILLIN <=2 SENSITIVE Sensitive     CEFAZOLIN <=4 SENSITIVE Sensitive     CEFEPIME <=1 SENSITIVE Sensitive     CEFTAZIDIME <=1 SENSITIVE Sensitive     CEFTRIAXONE <=1 SENSITIVE Sensitive     CIPROFLOXACIN >=4 RESISTANT Resistant     GENTAMICIN <=1 SENSITIVE Sensitive     IMIPENEM <=0.25 SENSITIVE Sensitive     TRIMETH/SULFA <=20 SENSITIVE Sensitive     AMPICILLIN/SULBACTAM <=2 SENSITIVE Sensitive     PIP/TAZO <=4 SENSITIVE Sensitive     * ESCHERICHIA COLI  Culture, blood (routine x 2)     Status: None   Collection Time: 01/01/15  4:19 PM  Result Value Ref Range Status   Specimen Description BLOOD LEFT HAND  Final   Special Requests BOTTLES DRAWN AEROBIC AND ANAEROBIC 5CC EACH  Final   Culture  Setup Time   Final    GRAM NEGATIVE RODS IN BOTH AEROBIC AND ANAEROBIC BOTTLES CRITICAL RESULT CALLED TO, READ BACK BY AND VERIFIED WITH: T FESTERMAN,RN AT 1035 01/02/15 BY L BENFIELD    Culture   Final    ESCHERICHIA COLI SUSCEPTIBILITIES PERFORMED ON PREVIOUS CULTURE WITHIN THE LAST 5 DAYS. Performed at [facility]    Report Status 01/04/2015 FINAL  Final     Studies: No results found.  Scheduled Meds: . aspirin EC  81 mg Oral Daily  . B-complex with vitamin C  1 tablet Oral Daily  . beta carotene w/minerals  1 tablet Oral Daily  . calcium-vitamin D  1 tablet Oral Daily  . cefTRIAXone (ROCEPHIN)  IV   2 g Intravenous Q24H  . enoxaparin (LOVENOX) injection  30 mg Subcutaneous Daily  . polyvinyl alcohol  1 drop Both Eyes BID  . vitamin C  1,000 mg Oral Daily   Continuous Infusions: . sodium chloride 75 mL/hr at 01/02/15 1849  Time Spent: 15 min   Charlynne Cousins  [facility] Pager [phone]. If 7PM-7AM, please contact night-coverage at [email or web address], password [facility] 01/04/2015, 12:45 PM  LOS: 2 days

## 2015-01-05 ENCOUNTER — Telehealth (HOSPITAL_BASED_OUTPATIENT_CLINIC_OR_DEPARTMENT_OTHER): Payer: Self-pay | Admitting: Emergency Medicine

## 2015-01-05 MED ORDER — CIPROFLOXACIN HCL 500 MG PO TABS: 500.0000 mg | ORAL_TABLET | Freq: Two times a day (BID) | ORAL | Status: DC

## 2015-01-05 NOTE — Progress Notes (Signed)
CM notes no HH services recc or ordered.  No other CM needs were communicated.

## 2015-01-05 NOTE — Discharge Summary (Signed)
Physician Discharge Summary  [name] ZOX:[id number] DOB: Mar 28, 1923 DOA: 01/02/2015  PCP: Estill Dooms, MD  Admit date: 01/02/2015 Discharge date: 01/05/2015  Time spent: 35 minutes  Recommendations for Outpatient Follow-up:  1. Primary care doctor in 2-4 weeks    Discharge Diagnoses:  Principal Problem:   Bacteremia Active Problems:   Urine, incontinence, stress female   Multiple pulmonary nodules   Hyperglycemia   UTI (lower urinary tract infection)   Hyponatremia   Sepsis due to Escherichia coli   Discharge Condition: stable  Diet recommendation: regular  Filed Weights   01/04/15 0000  Weight: 45.36 kg (100 lb)    History of present illness:   [name] is an 81 y.o. female with a PMH of stress incontinence and osteoarthritis who lives at an ALF and stays quite active who was originally seen in the ED on 01/01/15 for evaluation of polyuria and fever of 101.3. Urinalysis was consistent with a UTI so the patient was given an IM dose of Rocephin and discharged home on Keflex. Blood cultures were also drawn at that time and subsequently were positive for GNR and therefore the patient was advised to return to the ED.  Hospital Course:  Sepsis due to Escherichia coli bacteremia/UTI: Blood cultures 2 out of 2 grew Escherichia coli, urine culture grew Escherichia coli sensitive to Cipro. On admission she was started on IV Rocephin and her general malaise and her fever resolved. She was changed to ciprofloxacin which she will continue for total 14 days as an outpatient.  Hyponatremia: Likely due to hypovolemia due to resolving with IV hydration.  Hyperglycemia: likely due to infection will need to follow-up with her primary care doctor as an outpatient.  Multiple pulmonary nodules she will continue follow-up with pulmonary as an outpatient.   Consultants:  none  Procedures:  none  Discharge Exam: Filed Vitals:   01/05/15 0612  BP: 132/70   Pulse: 77  Temp: 97.8 F (36.6 C)  Resp: 20    General: A&O x3 Cardiovascular: RRR Respiratory: good air movement CTA B/L  Discharge Instructions   Discharge Instructions    Diet - low sodium heart healthy    Complete by:  As directed      Increase activity slowly    Complete by:  As directed           Current Discharge Medication List    START taking these medications   Details  ciprofloxacin (CIPRO) 500 MG tablet Take 1 tablet (500 mg total) by mouth 2 (two) times daily. Qty: 22 tablet, Refills: 0      CONTINUE these medications which have NOT CHANGED   Details  acetaminophen (TYLENOL) 500 MG tablet Take 500 mg by mouth every 6 (six) hours as needed for mild pain, moderate pain or headache.    Ascorbic Acid (VITAMIN C) 1000 MG tablet Take 1,000 mg by mouth daily.    aspirin EC 81 MG tablet Take 81 mg by mouth. Take one three times a week    b complex vitamins tablet Take 1 tablet by mouth daily.    beta carotene w/minerals (OCUVITE) tablet Take 1 tablet by mouth daily.    Calcium Carbonate-Vitamin D (CALCIUM 600 + D PO) Take 1 tablet by mouth daily.    diphenhydrAMINE (SOMINEX) 25 MG tablet Take 25 mg by mouth every 4 (four) hours as needed for allergies.    loperamide (IMODIUM) 2 MG capsule Take 2 mg by mouth as needed for diarrhea or  loose stools.    Polyethyl Glycol-Propyl Glycol (SYSTANE OP) Apply 1 drop to eye 2 (two) times daily.      STOP taking these medications     cephALEXin (KEFLEX) 500 MG capsule        Allergies  Allergen Reactions  . Penicillin G Itching  . Septra [Bactrim]     Stomach concerns.    Follow-up Information    Follow up with GREEN, Viviann Spare, MD In 4 weeks.   Specialty:  Internal Medicine   Why:  hospital follow up   Contact information:   [place] [address] [phone]        The results of significant diagnostics from this hospitalization (including imaging, microbiology, ancillary and  laboratory) are listed below for reference.    Significant Diagnostic Studies: Dg Chest 2 View  01/01/2015   CLINICAL DATA:  Shortness of breath.  EXAM: CHEST  2 VIEW  COMPARISON:  July 05, 2014.  FINDINGS: The heart size and mediastinal contours are within normal limits. No pneumothorax or pleural effusion is noted. Stable granuloma is noted in right midlung. Probable new enlarged nodule seen in the left perihilar region. Multilevel degenerative disc disease is noted in the lower thoracic spine. Stable mild compression deformity of mid thoracic vertebral body is noted.  IMPRESSION: Probable new enlarged nodule seen in the left perihilar region; CT scan of the chest with contrast administration is recommended for further evaluation.   Electronically Signed   By: Marijo Conception, M.D.   On: 01/01/2015 16:09    Microbiology: Recent Results (from the past 240 hour(s))  Urine culture     Status: None   Collection Time: 01/01/15  3:50 PM  Result Value Ref Range Status   Specimen Description URINE, CLEAN CATCH  Final   Special Requests NONE  Final   Culture   Final    >=100,000 COLONIES/mL ESCHERICHIA COLI Performed at [facility]    Report Status 01/03/2015 FINAL  Final   Organism ID, Bacteria ESCHERICHIA COLI  Final      Susceptibility   Escherichia coli - MIC*    AMPICILLIN <=2 SENSITIVE Sensitive     CEFAZOLIN <=4 SENSITIVE Sensitive     CEFTRIAXONE <=1 SENSITIVE Sensitive     CIPROFLOXACIN >=4 RESISTANT Resistant     GENTAMICIN <=1 SENSITIVE Sensitive     IMIPENEM <=0.25 SENSITIVE Sensitive     NITROFURANTOIN <=16 SENSITIVE Sensitive     TRIMETH/SULFA <=20 SENSITIVE Sensitive     AMPICILLIN/SULBACTAM <=2 SENSITIVE Sensitive     PIP/TAZO <=4 SENSITIVE Sensitive     * >=100,000 COLONIES/mL ESCHERICHIA COLI  Culture, blood (routine x 2)     Status: None   Collection Time: 01/01/15  4:18 PM  Result Value Ref Range Status   Specimen Description BLOOD LEFT ANTECUBITAL  Final    Special Requests BOTTLES DRAWN AEROBIC AND ANAEROBIC 5CC  Final   Culture  Setup Time   Final    GRAM NEGATIVE RODS IN BOTH AEROBIC AND ANAEROBIC BOTTLES CRITICAL RESULT CALLED TO, READ BACK BY AND VERIFIED WITH: Marijean Heath AT 0160 01/02/15 BY L BENFIELD    Culture   Final    ESCHERICHIA COLI Performed at [facility]    Report Status 01/04/2015 FINAL  Final   Organism ID, Bacteria ESCHERICHIA COLI  Final      Susceptibility   Escherichia coli - MIC*    AMPICILLIN <=2 SENSITIVE Sensitive     CEFAZOLIN <=4  SENSITIVE Sensitive     CEFEPIME <=1 SENSITIVE Sensitive     CEFTAZIDIME <=1 SENSITIVE Sensitive     CEFTRIAXONE <=1 SENSITIVE Sensitive     CIPROFLOXACIN >=4 RESISTANT Resistant     GENTAMICIN <=1 SENSITIVE Sensitive     IMIPENEM <=0.25 SENSITIVE Sensitive     TRIMETH/SULFA <=20 SENSITIVE Sensitive     AMPICILLIN/SULBACTAM <=2 SENSITIVE Sensitive     PIP/TAZO <=4 SENSITIVE Sensitive     * ESCHERICHIA COLI  Culture, blood (routine x 2)     Status: None   Collection Time: 01/01/15  4:19 PM  Result Value Ref Range Status   Specimen Description BLOOD LEFT HAND  Final   Special Requests BOTTLES DRAWN AEROBIC AND ANAEROBIC 5CC EACH  Final   Culture  Setup Time   Final    GRAM NEGATIVE RODS IN BOTH AEROBIC AND ANAEROBIC BOTTLES CRITICAL RESULT CALLED TO, READ BACK BY AND VERIFIED WITH: T FESTERMAN,RN AT 1035 01/02/15 BY L BENFIELD    Culture   Final    ESCHERICHIA COLI SUSCEPTIBILITIES PERFORMED ON PREVIOUS CULTURE WITHIN THE LAST 5 DAYS. Performed at [facility]    Report Status 01/04/2015 FINAL  Final     Labs: Basic Metabolic Panel:  Recent Labs Lab 01/01/15 1627 01/01/15 1735 01/02/15 1547 01/03/15 0604 01/04/15 0555  NA 127* 132* 129* 134* 135  K 4.2 4.1 4.3 4.1 3.8  CL 95* 95* 95* 103 105  CO2  --  27 24 24 23   GLUCOSE 110* 101* 113* 90 86  BUN 20 18 27* 22* 16  CREATININE 0.90 0.89 0.88 0.92 0.73  CALCIUM  --  8.7* 8.7* 8.0* 7.7*    Liver Function Tests:  Recent Labs Lab 01/01/15 1735 01/02/15 1547 01/04/15 0555  AST 39 35 32  ALT 28 28 36  ALKPHOS 85 80 79  BILITOT 0.7 0.6 0.5  PROT 6.3* 6.2* 4.7*  ALBUMIN 3.3* 3.3* 2.2*   No results for input(s): LIPASE, AMYLASE in the last 168 hours. No results for input(s): AMMONIA in the last 168 hours. CBC:  Recent Labs Lab 01/01/15 1554 01/01/15 1627 01/02/15 1547 01/04/15 0555  WBC 14.4*  --  14.7* 9.3  NEUTROABS 11.8*  --  12.4*  --   HGB 13.5 14.6 13.0 11.5*  HCT 39.3 43.0 36.4 34.1*  MCV 89.9  --  88.6 90.7  PLT 172  --  186 173   Cardiac Enzymes: No results for input(s): CKTOTAL, CKMB, CKMBINDEX, TROPONINI in the last 168 hours. BNP: BNP (last 3 results) No results for input(s): BNP in the last 8760 hours.  ProBNP (last 3 results) No results for input(s): PROBNP in the last 8760 hours.  CBG: No results for input(s): GLUCAP in the last 168 hours.     Signed:  Charlynne Cousins  [facility] 01/05/2015, 12:22 PM

## 2015-01-05 NOTE — Telephone Encounter (Signed)
Post ED Visit - Positive Culture Follow-up  Culture report reviewed by antimicrobial stewardship pharmacist:  [x]  Heide Guile, Pharm.D., BCPS []  Alycia Rossetti, Pharm.D., BCPS []  [place], Pharm.D., BCPS, AAHIVP []  Legrand Como, Pharm.D., BCPS, AAHIVP []  [place], Pharm.D. []  Milus Glazier, Pharm.D.  Positive blood culture E. coli Treated with cephalexin, organism sensitive to the same and no further patient follow-up is required at this time.  Hazle Nordmann 01/05/2015, 10:00 AM

## 2015-01-05 NOTE — Care Management Important Message (Signed)
Important Message  Patient Details  Name: ADALYNNE STEFFENSMEIER MRN: [id number] Date of Birth: 08/04/1922   Medicare Important Message Given:  Yes-second notification given    Camillo Flaming 01/05/2015, 12:48 [place] Message  Patient Details  Name: ELSA PLOCH MRN: [id number] Date of Birth: [birth year]   Medicare Important Message Given:  Yes-second notification given    Camillo Flaming 01/05/2015, 12:48 PM

## 2015-01-05 NOTE — Progress Notes (Signed)
Discussed with patient discharge instructions, she verbalized agreement and understanding.  Patient's IV was discontinued with no complications. Patient to go down in wheelchair with all belongings to go home in private vehicle. Philis Fendt, RN 01/05/2015 1807

## 2015-01-12 ENCOUNTER — Non-Acute Institutional Stay: Payer: Medicare Other | Admitting: Nurse Practitioner

## 2015-01-12 ENCOUNTER — Encounter: Payer: Self-pay | Admitting: Nurse Practitioner

## 2015-01-12 VITALS — BP 110/58 | HR 80 | Temp 97.9°F | Wt 99.0 lb

## 2015-01-12 DIAGNOSIS — R918 Other nonspecific abnormal finding of lung field: Secondary | ICD-10-CM | POA: Diagnosis not present

## 2015-01-12 DIAGNOSIS — N393 Stress incontinence (female) (male): Secondary | ICD-10-CM

## 2015-01-12 DIAGNOSIS — Z66 Do not resuscitate: Secondary | ICD-10-CM | POA: Diagnosis not present

## 2015-01-12 DIAGNOSIS — A4151 Sepsis due to Escherichia coli [E. coli]: Secondary | ICD-10-CM | POA: Diagnosis not present

## 2015-01-12 DIAGNOSIS — E871 Hypo-osmolality and hyponatremia: Secondary | ICD-10-CM | POA: Diagnosis not present

## 2015-01-12 DIAGNOSIS — N390 Urinary tract infection, site not specified: Secondary | ICD-10-CM

## 2015-01-12 DIAGNOSIS — K219 Gastro-esophageal reflux disease without esophagitis: Secondary | ICD-10-CM | POA: Diagnosis not present

## 2015-01-12 NOTE — Progress Notes (Signed)
Patient ID: [name], female   DOB: 1922-10-03, 81 y.o.   MRN: [id number]  Location:  clinic [place] Loretta Doutt:  Marlana Latus NP  Code Status:  DNR Goals of care: Advanced Directive information Type of Advance Directive: Living will;Out of facility DNR (pink MOST or yellow form), Pre-existing out of facility DNR order (yellow form or pink MOST form): Yellow form placed in chart (order not valid for inpatient use)  Chief Complaint  Patient presents with  . Hospitalization Follow-up    ER 01/01/15 for fever, was called to return admitted 01/02/15 to 01/05/15 Bactermia Sepsis due to E-coli     HPI: Patient is a 81 y.o. female seen in the clinic at [facility] today for evaluation of  01/02/15-01/05/15 hospitalized for Sepsis due to Escherichia coli bacteremia/UTI: Blood cultures 2 out of 2 grew Escherichia coli, urine culture grew Escherichia coli sensitive to Cipro. On admission she was started on IV Rocephin and her general malaise and her fever resolved. She was changed to ciprofloxacin which she will continue for total 14 days as an outpatient.  Review of Systems:  Review of Systems  Constitutional: Negative.   HENT: Positive for hearing loss. Negative for sore throat.   Eyes: Negative.   Cardiovascular: Negative for chest pain, palpitations and leg swelling.  Gastrointestinal: Negative for diarrhea.  Genitourinary:       Incontinent of urine.  Musculoskeletal:       Left knee pain. Hx of tibial plateau fracture. Osteoarthritis of the fingers. Tender in the lateral right mid -calf area.  Skin: Negative.   Neurological: Negative for dizziness, tremors, speech change, seizures, weakness and headaches.  Psychiatric/Behavioral: The patient is not nervous/anxious.     Past Medical History  Diagnosis Date  . Arthritis   . Cancer     squamous cell  . Incontinence of urine   . Allergy   . Oxygen deficiency   . GERD (gastroesophageal reflux disease)   . Squamous cell  carcinoma of skin   . Female stress incontinence   . Frequent loose stools   . TIA (transient ischemic attack) 01/05/13    Patient Active Problem List   Diagnosis Date Noted  . Closed fracture of right patella 01/16/2015  . Sepsis due to Escherichia coli 01/04/2015  . Bacteremia 01/02/2015  . UTI (lower urinary tract infection) 01/02/2015  . Hyponatremia 01/02/2015  . Carotid atherosclerosis 06/09/2014  . Hyperglycemia 06/09/2014  . Multiple pulmonary nodules 04/01/2014  . Hearing loss 01/20/2014  . Insomnia 02/08/2013  . Left shoulder pain 02/08/2013  . History of amputation of lesser toe of right foot 01/20/2013  . TIA (transient ischemic attack) 01/13/2013  . Tibial plateau fracture 01/13/2013  . Frequent loose stools 07/01/2012  . GERD (gastroesophageal reflux disease) 03/21/2011  . Squamous cell skin cancer 03/21/2011  . Urine, incontinence, stress female 03/21/2011  . Osteoarthritis, hand 03/21/2011    Allergies  Allergen Reactions  . Penicillin G Itching    Has patient had a PCN reaction causing immediate rash, facial/tongue/throat swelling, SOB or lightheadedness with hypotension: No Has patient had a PCN reaction causing severe rash involving mucus membranes or skin necrosis: No Has patient had a PCN reaction that required hospitalization No Has patient had a PCN reaction occurring within the last 10 years: No If all of the above answers are "NO", then may proceed with Cephalosporin use.  Sarina Ill [Bactrim]     Stomach concerns.     Medications: Patient's Medications  New Prescriptions  HYDROCODONE-ACETAMINOPHEN (NORCO/VICODIN) 5-325 MG PER TABLET    Take 1 tablet by mouth every 4 (four) hours as needed.  Previous Medications   ACETAMINOPHEN (TYLENOL) 500 MG TABLET    Take 500 mg by mouth every 6 (six) hours as needed for mild pain, moderate pain or headache.   ASCORBIC ACID (VITAMIN C) 1000 MG TABLET    Take 1,000 mg by mouth daily.   ASPIRIN EC 81 MG TABLET     Take 81 mg by mouth daily.    B COMPLEX VITAMINS TABLET    Take 1 tablet by mouth daily.   BETA CAROTENE W/MINERALS (OCUVITE) TABLET    Take 1 tablet by mouth daily.   CALCIUM CARBONATE-VITAMIN D (CALCIUM 600 + D PO)    Take 1 tablet by mouth daily.   CIPROFLOXACIN (CIPRO) 500 MG TABLET    Take 1 tablet (500 mg total) by mouth 2 (two) times daily.   DIPHENHYDRAMINE (SOMINEX) 25 MG TABLET    Take 25 mg by mouth every 4 (four) hours as needed for allergies.   LOPERAMIDE (IMODIUM) 2 MG CAPSULE    Take 2 mg by mouth as needed for diarrhea or loose stools.   POLYETHYL GLYCOL-PROPYL GLYCOL (SYSTANE OP)    Apply 1 drop to eye 2 (two) times daily.  Modified Medications   No medications on file  Discontinued Medications   No medications on file    Physical Exam: Filed Vitals:   01/12/15 1412  BP: 110/58  Pulse: 80  Temp: 97.9 F (36.6 C)  TempSrc: Oral  Weight: 99 lb (44.906 kg)  SpO2: 97%   Body mass index is 18.72 kg/(m^2).  Physical Exam  Constitutional: She is oriented to person, place, and time. She appears well-developed and well-nourished. No distress.  HENT:  Head: Normocephalic and atraumatic.  Right Ear: External ear normal.  Left Ear: External ear normal.  Nose: Nose normal.  Mouth/Throat: Oropharynx is clear and moist. No oropharyngeal exudate.  Bilateral hearing aids. Profound loss of hearing.  Eyes: Conjunctivae and EOM are normal. Pupils are equal, round, and reactive to light. Right eye exhibits no discharge. Left eye exhibits no discharge. No scleral icterus.  Neck: Normal range of motion. Neck supple. No JVD present. No tracheal deviation present. No thyromegaly present.  Cardiovascular: Normal rate, regular rhythm, normal heart sounds and intact distal pulses.   No murmur heard. Pulmonary/Chest: Effort normal and breath sounds normal. No respiratory distress. She has no wheezes. She has no rales.  Abdominal: Soft. Bowel sounds are normal. She exhibits no  distension. There is no tenderness.  Musculoskeletal: She exhibits edema and tenderness.  Left knee pain.  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 Tender in the lateral aspect of the right calf muscle. No palpable venous cords. There is no tenderness in the medial thigh or groin. No palpable lymph nodes. Patient is able to walk.  Neurological: She is alert and oriented to person, place, and time. She displays normal reflexes. No cranial nerve deficit. She exhibits normal muscle tone. Coordination normal.  Skin: No rash noted. She is not diaphoretic. There is erythema. No pallor.  Brownish pigmented BLE  Psychiatric: She has a normal mood and affect. Her behavior is normal. Judgment and thought content normal.  Talks constantly and changes her subjects frequently.     Labs reviewed: Basic Metabolic Panel:  Recent Labs  01/02/15 1547 01/03/15 0604 01/04/15 0555  NA 129* 134* 135  K 4.3 4.1 3.8  CL  95* 103 105  CO2 24 24 23   GLUCOSE 113* 90 86  BUN 27* 22* 16  CREATININE 0.88 0.92 0.73  CALCIUM 8.7* 8.0* 7.7*    Liver Function Tests:  Recent Labs  01/01/15 1735 01/02/15 1547 01/04/15 0555  AST 39 35 32  ALT 28 28 36  ALKPHOS 85 80 79  BILITOT 0.7 0.6 0.5  PROT 6.3* 6.2* 4.7*  ALBUMIN 3.3* 3.3* 2.2*    CBC:  Recent Labs  01/01/15 1554 01/01/15 1627 01/02/15 1547 01/04/15 0555  WBC 14.4*  --  14.7* 9.3  NEUTROABS 11.8*  --  12.4*  --   HGB 13.5 14.6 13.0 11.5*  HCT 39.3 43.0 36.4 34.1*  MCV 89.9  --  88.6 90.7  PLT 172  --  186 173    Lab Results  Component Value Date   TSH 1.844 01/13/2013   Lab Results  Component Value Date   HGBA1C 6.0 06/14/2014   Lab Results  Component Value Date   CHOL 174 06/14/2014   HDL 96* 06/14/2014   LDLCALC 64 06/14/2014   TRIG 69 06/14/2014   CHOLHDL 1.4 01/13/2013    Significant Diagnostic Results since last visit: none  Patient Care Team: Estill Dooms, MD as PCP - General (Internal  Medicine) Dorna Leitz, MD as Consulting Physician (Orthopedic Surgery) Carol Ada, MD as Consulting Physician (Gastroenterology) Crista Luria, MD as Consulting Physician (Dermatology) [facility] as Audiologist (Audiology) Man Mast X, NP as Nurse Practitioner (Nurse Practitioner)  Assessment/Plan Problem List Items Addressed This Visit    UTI (lower urinary tract infection)    Will repeat UA C/S upon completion of Cipro      Urine, incontinence, stress female    Leakage, adult pads for urinary incontinent care.      Sepsis due to Escherichia coli    Update CBC, TSH, UA C/S upon completion of the ABT      Multiple pulmonary nodules    See CT chest 03/15/14  - not vz on cxr 07/05/14 and pt not wishing to pursue tissue dx for "microscopic" multifocal dz      Hyponatremia    Na 135 01/04/15, update CMP      GERD (gastroesophageal reflux disease) - Primary    Stable off med       Other Visit Diagnoses    Advance directive indicates patient wish for do-not-resuscitate status        Relevant Orders    DNR (Do Not Resuscitate)        Family/ staff Communication: observe the patient.   Labs/tests ordered: CBC, CMP, TSH, UA C/S  [facility] Mast NP [facility] [address], [place] [address] On Call:  [phone] & follow prompts after 5pm & weekends Office Phone:  [phone] Office Fax:  [phone]

## 2015-01-12 NOTE — Assessment & Plan Note (Signed)
Will repeat UA C/S upon completion of Cipro

## 2015-01-12 NOTE — Assessment & Plan Note (Addendum)
Na 135 01/04/15, update CMP

## 2015-01-12 NOTE — Assessment & Plan Note (Signed)
Update CBC, TSH, UA C/S upon completion of the ABT

## 2015-01-12 NOTE — Assessment & Plan Note (Signed)
Stable off med 

## 2015-01-12 NOTE — Assessment & Plan Note (Signed)
Leakage, adult pads for urinary incontinent care.

## 2015-01-12 NOTE — Assessment & Plan Note (Signed)
See CT chest 03/15/14  - not vz on cxr 07/05/14 and pt not wishing to pursue tissue dx for "microscopic" multifocal dz

## 2015-01-14 DIAGNOSIS — M25569 Pain in unspecified knee: Secondary | ICD-10-CM | POA: Diagnosis not present

## 2015-01-15 ENCOUNTER — Encounter (HOSPITAL_COMMUNITY): Payer: Self-pay | Admitting: Emergency Medicine

## 2015-01-15 ENCOUNTER — Emergency Department (HOSPITAL_COMMUNITY)
Admission: EM | Admit: 2015-01-15 | Discharge: 2015-01-15 | Disposition: A | Discharge status: Home / Self Care | Payer: Medicare Other | Attending: Emergency Medicine | Admitting: Emergency Medicine

## 2015-01-15 ENCOUNTER — Emergency Department (HOSPITAL_COMMUNITY): Admission: EM | Admit: 2015-01-15 | Payer: Medicare Other | Source: Home / Self Care

## 2015-01-15 DIAGNOSIS — T148 Other injury of unspecified body region: Secondary | ICD-10-CM | POA: Diagnosis not present

## 2015-01-15 DIAGNOSIS — S81811A Laceration without foreign body, right lower leg, initial encounter: Secondary | ICD-10-CM | POA: Diagnosis not present

## 2015-01-15 DIAGNOSIS — Z8719 Personal history of other diseases of the digestive system: Secondary | ICD-10-CM | POA: Insufficient documentation

## 2015-01-15 DIAGNOSIS — Y9389 Activity, other specified: Secondary | ICD-10-CM | POA: Diagnosis not present

## 2015-01-15 DIAGNOSIS — Z880 Allergy status to penicillin: Secondary | ICD-10-CM | POA: Insufficient documentation

## 2015-01-15 DIAGNOSIS — S82031A Displaced transverse fracture of right patella, initial encounter for closed fracture: Secondary | ICD-10-CM | POA: Insufficient documentation

## 2015-01-15 DIAGNOSIS — Z79899 Other long term (current) drug therapy: Secondary | ICD-10-CM | POA: Diagnosis not present

## 2015-01-15 DIAGNOSIS — Z792 Long term (current) use of antibiotics: Secondary | ICD-10-CM | POA: Insufficient documentation

## 2015-01-15 DIAGNOSIS — Z8673 Personal history of transient ischemic attack (TIA), and cerebral infarction without residual deficits: Secondary | ICD-10-CM | POA: Insufficient documentation

## 2015-01-15 DIAGNOSIS — Z7982 Long term (current) use of aspirin: Secondary | ICD-10-CM | POA: Insufficient documentation

## 2015-01-15 DIAGNOSIS — W01198A Fall on same level from slipping, tripping and stumbling with subsequent striking against other object, initial encounter: Secondary | ICD-10-CM | POA: Insufficient documentation

## 2015-01-15 DIAGNOSIS — Z85828 Personal history of other malignant neoplasm of skin: Secondary | ICD-10-CM | POA: Diagnosis not present

## 2015-01-15 DIAGNOSIS — S82001A Unspecified fracture of right patella, initial encounter for closed fracture: Secondary | ICD-10-CM

## 2015-01-15 DIAGNOSIS — Z8742 Personal history of other diseases of the female genital tract: Secondary | ICD-10-CM | POA: Diagnosis not present

## 2015-01-15 DIAGNOSIS — Y998 Other external cause status: Secondary | ICD-10-CM | POA: Insufficient documentation

## 2015-01-15 DIAGNOSIS — S82091A Other fracture of right patella, initial encounter for closed fracture: Secondary | ICD-10-CM | POA: Diagnosis not present

## 2015-01-15 DIAGNOSIS — Z87891 Personal history of nicotine dependence: Secondary | ICD-10-CM | POA: Diagnosis not present

## 2015-01-15 DIAGNOSIS — M1990 Unspecified osteoarthritis, unspecified site: Secondary | ICD-10-CM | POA: Insufficient documentation

## 2015-01-15 DIAGNOSIS — Y9289 Other specified places as the place of occurrence of the external cause: Secondary | ICD-10-CM | POA: Diagnosis not present

## 2015-01-15 DIAGNOSIS — Z23 Encounter for immunization: Secondary | ICD-10-CM | POA: Insufficient documentation

## 2015-01-15 DIAGNOSIS — S8991XA Unspecified injury of right lower leg, initial encounter: Secondary | ICD-10-CM | POA: Diagnosis present

## 2015-01-15 MED ORDER — HYDROCODONE-ACETAMINOPHEN 5-325 MG PO TABS: 1.0000 | ORAL_TABLET | ORAL | Status: DC | PRN

## 2015-01-15 MED ORDER — TETANUS-DIPHTH-ACELL PERTUSSIS 5-2.5-18.5 LF-MCG/0.5 IM SUSP
0.5000 mL | Freq: Once | INTRAMUSCULAR | Status: AC
  Administered 2015-01-15: 0.5 mL via INTRAMUSCULAR
  Filled 2015-01-15: qty 0.5

## 2015-01-15 NOTE — ED Notes (Signed)
Bed: PE48 Expected date: 01/15/15 Expected time: 1:13 PM Means of arrival: Ambulance Comments: 81 yo fell yesterday, x rayed, fx patella rt with skin tears

## 2015-01-15 NOTE — ED Notes (Signed)
Pt from nursing facility, fell several days ago, XR positive for patellar fracture. Pt with abrasian to R leg.

## 2015-01-15 NOTE — ED Notes (Signed)
Pt attempted to ambulate yesterday, first time since being d/c from [facility], pt fell and XR shows R patellar fracture. Abrasions to R lle.

## 2015-01-15 NOTE — Discharge Instructions (Signed)
Return to the ED with any concerns including increased pain, leg swelling, redness around wounds, pus draining, fever/chills, decreased level of alertness/lethargy, or any other alarming symptoms

## 2015-01-15 NOTE — ED Notes (Signed)
Bed: [facility] Expected date:  Expected time:  Means of arrival:  Comments: 

## 2015-01-15 NOTE — ED Provider Notes (Signed)
CSN: [id number]     Arrival date & time 01/15/15  1352 History   First MD Initiated Contact with Patient 01/15/15 1353     Chief Complaint  Patient presents with  . Leg Injury     (Consider location/radiation/quality/duration/timing/severity/associated sxs/prior Treatment) HPI  Pt presenting with c/o fall with right knee pain.  Pt states that this was her first time trying to walk since recent discharge from the hospital.  She fell forward hitting her right knee.  [facility] lives [facility].  She had an xray performed last night and this showed a transverse patella fracture.  She has some skin tears to the right RLE as well.  Pt brought into the ED by EMS today for further evaluation.  No other areas of pain.  Written xray report is accompanying patient and reads as transverse patella fracture- femur/tib/fib are normal.  There are no other associated systemic symptoms, there are no other alleviating or modifying factors.   Past Medical History  Diagnosis Date  . Arthritis   . Cancer     squamous cell  . Incontinence of urine   . Allergy   . Oxygen deficiency   . GERD (gastroesophageal reflux disease)   . Squamous cell carcinoma of skin   . Female stress incontinence   . Frequent loose stools   . TIA (transient ischemic attack) 01/05/13   Past Surgical History  Procedure Laterality Date  . Breast surgery  1982    breast  . Tonsillectomy  1940  . Abdominal hysterectomy  1959  . Tee without cardioversion N/A 01/18/2013    Procedure: TRANSESOPHAGEAL ECHOCARDIOGRAM (TEE);  Surgeon: Lelon Perla, MD;  Location: [place];  Service: Cardiovascular;  Laterality: N/A;  . Appendectomy  1959  . Colonoscopy  2013    Dr. Carol Ada   Family History  Problem Relation Age of Onset  . Heart disease Mother     failure  . Cancer Sister     breast  . Cancer Father   . Cancer Son     lung   Social History  Substance Use Topics  . Smoking status: Former Smoker -- 1.00 packs/day  for 15 years    Types: Cigarettes    Quit date: 03/20/1977  . Smokeless tobacco: Never Used  . Alcohol Use: No   OB History    No data available     Review of Systems  ROS reviewed and all otherwise negative except for mentioned in HPI    Allergies  Penicillin g and Septra  Home Medications   Prior to Admission medications   Medication Sig Start Date End Date Taking? Authorizing Provider  acetaminophen (TYLENOL) 500 MG tablet Take 500 mg by mouth every 6 (six) hours as needed for mild pain, moderate pain or headache.   Yes Historical Provider, MD  Ascorbic Acid (VITAMIN C) 1000 MG tablet Take 1,000 mg by mouth daily.   Yes Historical Provider, MD  aspirin EC 81 MG tablet Take 81 mg by mouth daily.    Yes Historical Provider, MD  b complex vitamins tablet Take 1 tablet by mouth daily.   Yes Historical Provider, MD  beta carotene w/minerals (OCUVITE) tablet Take 1 tablet by mouth daily.   Yes Historical Provider, MD  Calcium Carbonate-Vitamin D (CALCIUM 600 + D PO) Take 1 tablet by mouth daily.   Yes Historical Provider, MD  ciprofloxacin (CIPRO) 500 MG tablet Take 1 tablet (500 mg total) by mouth 2 (two) times daily. 01/05/15  Yes Charlynne Cousins, MD  diphenhydrAMINE (SOMINEX) 25 MG tablet Take 25 mg by mouth every 4 (four) hours as needed for allergies.   Yes Historical Provider, MD  loperamide (IMODIUM) 2 MG capsule Take 2 mg by mouth as needed for diarrhea or loose stools.   Yes Historical Provider, MD  Polyethyl Glycol-Propyl Glycol (SYSTANE OP) Apply 1 drop to eye 2 (two) times daily.   Yes Historical Provider, MD  HYDROcodone-acetaminophen (NORCO/VICODIN) 5-325 MG per tablet Take 1 tablet by mouth every 4 (four) hours as needed. 01/15/15   Alfonzo Beers, MD   BP 133/54 mmHg  Pulse 81  Temp(Src) 98.5 F (36.9 C) (Oral)  Resp 20  SpO2 95%  Vitals reviewed Physical Exam  Physical Examination: General appearance - alert, well appearing, and in no distress Mental status -  alert, oriented to person, place, and time Eyes - no conjunctival injection, no scleral icterus Chest - clear to auscultation, no wheezes, rales or rhonchi, symmetric air entry Heart - normal rate, regular rhythm, normal S1, S2, no murmurs, rubs, clicks or gallops Neurological - alert, oriented, normal speech, foot distally NVI Musculoskeletal - ttp over right knee with bruising/superficial skin tear overlying as well another skin tear on mid right anterior tib/fib as well, otherwise no joint tenderness, deformity or swelling Extremities - peripheral pulses normal, no pedal edema, no clubbing or cyanosis Skin - normal coloration and turgor, no rashes, scattered contusions, multiple skin tears  ED Course  Procedures (including critical care time) Labs Review Labs Reviewed - No data to display  Imaging Review No results found. I have personally reviewed and evaluated these images and lab results as part of my medical decision-making.   EKG Interpretation None      MDM   Final diagnoses:  Patella fracture, right, closed, initial encounter  Skin tear of right lower leg without complication, initial encounter    Pt presenting with patellar fracture, skin tears of knee.  This does not constitute an open fracture- would like to avoid antibiotics in this patient who just had hospitalization for UTI and finished course of cipro.  Wounds dresssed, tetanus updated, placed in knee immobilizer and patient will need to have f/u with orthopedics.  Discharged with strict return precautions.  Pt agreeable with plan.    Alfonzo Beers, MD 01/15/15 [phone]

## 2015-01-16 ENCOUNTER — Encounter: Payer: Self-pay | Admitting: Nurse Practitioner

## 2015-01-16 DIAGNOSIS — S82001A Unspecified fracture of right patella, initial encounter for closed fracture: Secondary | ICD-10-CM | POA: Insufficient documentation

## 2015-01-17 DIAGNOSIS — S82031A Displaced transverse fracture of right patella, initial encounter for closed fracture: Secondary | ICD-10-CM | POA: Diagnosis not present

## 2015-01-19 DIAGNOSIS — M6281 Muscle weakness (generalized): Secondary | ICD-10-CM | POA: Diagnosis not present

## 2015-01-19 DIAGNOSIS — Z23 Encounter for immunization: Secondary | ICD-10-CM | POA: Diagnosis not present

## 2015-01-19 DIAGNOSIS — Z9181 History of falling: Secondary | ICD-10-CM | POA: Diagnosis not present

## 2015-01-19 DIAGNOSIS — R2681 Unsteadiness on feet: Secondary | ICD-10-CM | POA: Diagnosis not present

## 2015-01-23 DIAGNOSIS — L570 Actinic keratosis: Secondary | ICD-10-CM | POA: Diagnosis not present

## 2015-01-23 DIAGNOSIS — D0430 Carcinoma in situ of skin of unspecified part of face: Secondary | ICD-10-CM | POA: Diagnosis not present

## 2015-01-24 ENCOUNTER — Ambulatory Visit: Payer: Medicare Other | Admitting: Internal Medicine

## 2015-01-24 DIAGNOSIS — Z9181 History of falling: Secondary | ICD-10-CM | POA: Diagnosis not present

## 2015-01-24 DIAGNOSIS — N390 Urinary tract infection, site not specified: Secondary | ICD-10-CM | POA: Diagnosis not present

## 2015-01-24 DIAGNOSIS — R2681 Unsteadiness on feet: Secondary | ICD-10-CM | POA: Diagnosis not present

## 2015-01-24 DIAGNOSIS — E871 Hypo-osmolality and hyponatremia: Secondary | ICD-10-CM | POA: Diagnosis not present

## 2015-01-24 DIAGNOSIS — A4151 Sepsis due to Escherichia coli [E. coli]: Secondary | ICD-10-CM | POA: Diagnosis not present

## 2015-01-24 DIAGNOSIS — M6281 Muscle weakness (generalized): Secondary | ICD-10-CM | POA: Diagnosis not present

## 2015-01-25 ENCOUNTER — Ambulatory Visit: Payer: Medicare Other | Admitting: Internal Medicine

## 2015-01-26 ENCOUNTER — Encounter: Payer: Medicare Other | Admitting: Nurse Practitioner

## 2015-01-27 DIAGNOSIS — Z9181 History of falling: Secondary | ICD-10-CM | POA: Diagnosis not present

## 2015-01-27 DIAGNOSIS — M6281 Muscle weakness (generalized): Secondary | ICD-10-CM | POA: Diagnosis not present

## 2015-01-27 DIAGNOSIS — R2681 Unsteadiness on feet: Secondary | ICD-10-CM | POA: Diagnosis not present

## 2015-01-31 ENCOUNTER — Other Ambulatory Visit: Payer: Self-pay | Admitting: Orthopedic Surgery

## 2015-01-31 DIAGNOSIS — R2681 Unsteadiness on feet: Secondary | ICD-10-CM | POA: Diagnosis not present

## 2015-01-31 DIAGNOSIS — M6281 Muscle weakness (generalized): Secondary | ICD-10-CM | POA: Diagnosis not present

## 2015-01-31 DIAGNOSIS — Z9181 History of falling: Secondary | ICD-10-CM | POA: Diagnosis not present

## 2015-01-31 DIAGNOSIS — S82031D Displaced transverse fracture of right patella, subsequent encounter for closed fracture with routine healing: Secondary | ICD-10-CM | POA: Diagnosis not present

## 2015-02-01 DIAGNOSIS — M6281 Muscle weakness (generalized): Secondary | ICD-10-CM | POA: Diagnosis not present

## 2015-02-01 DIAGNOSIS — R2681 Unsteadiness on feet: Secondary | ICD-10-CM | POA: Diagnosis not present

## 2015-02-01 DIAGNOSIS — Z9181 History of falling: Secondary | ICD-10-CM | POA: Diagnosis not present

## 2015-02-01 LAB — HM MAMMOGRAPHY: HM MAMMO: NORMAL (ref 0–4)

## 2015-02-02 ENCOUNTER — Encounter (HOSPITAL_COMMUNITY): Payer: Self-pay | Admitting: *Deleted

## 2015-02-02 ENCOUNTER — Non-Acute Institutional Stay: Payer: Medicare Other | Admitting: Nurse Practitioner

## 2015-02-02 ENCOUNTER — Encounter: Payer: Self-pay | Admitting: Nurse Practitioner

## 2015-02-02 DIAGNOSIS — S82001S Unspecified fracture of right patella, sequela: Secondary | ICD-10-CM | POA: Diagnosis not present

## 2015-02-02 DIAGNOSIS — M6281 Muscle weakness (generalized): Secondary | ICD-10-CM | POA: Diagnosis not present

## 2015-02-02 DIAGNOSIS — E871 Hypo-osmolality and hyponatremia: Secondary | ICD-10-CM | POA: Diagnosis not present

## 2015-02-02 DIAGNOSIS — R2681 Unsteadiness on feet: Secondary | ICD-10-CM | POA: Diagnosis not present

## 2015-02-02 DIAGNOSIS — Z9181 History of falling: Secondary | ICD-10-CM | POA: Diagnosis not present

## 2015-02-02 DIAGNOSIS — N393 Stress incontinence (female) (male): Secondary | ICD-10-CM | POA: Diagnosis not present

## 2015-02-02 DIAGNOSIS — N390 Urinary tract infection, site not specified: Secondary | ICD-10-CM | POA: Diagnosis not present

## 2015-02-02 MED ORDER — CEFAZOLIN SODIUM-DEXTROSE 2-3 GM-% IV SOLR
2.0000 g | INTRAVENOUS | Status: AC
  Administered 2015-02-03: 2 g via INTRAVENOUS
  Filled 2015-02-02: qty 50

## 2015-02-02 MED ORDER — CHLORHEXIDINE GLUCONATE 4 % EX LIQD: 60.0000 mL | Freq: Once | CUTANEOUS | Status: DC

## 2015-02-02 NOTE — Assessment & Plan Note (Signed)
01/26/15 urine culture, E Coli, complete 7 day course of Nitrofurantoin 100mg  bid started 01/30/15, repeat UA c/s upon completion of ABT

## 2015-02-02 NOTE — Progress Notes (Signed)
Kalman Shan, RN from [facility] faxed [facility] with medical history to me. I faxed pre-op instructions to the attention of Helene Kelp, nurse for Mrs. Narciso. Also spoke with Helene Kelp and went over the instructions with her.

## 2015-02-02 NOTE — Assessment & Plan Note (Signed)
01/24/15 Na 141

## 2015-02-02 NOTE — Assessment & Plan Note (Signed)
01/14/15 x-ray transverse fracture identified through through the right patella.  Knee immobilizer, f/u Ortho

## 2015-02-02 NOTE — Pre-Procedure Instructions (Signed)
    [name]  02/02/2015       Mrs. Dorsainvil procedure is scheduled for tomorrow, February 03, 2015 at 2:15 PM.   Report to [facility] Entrance "A" Admitting Office at 11:45 AM.   Call this number if you have problems the morning of surgery: [phone]     Remember:  Do not eat food or drink liquids after midnight tonight (includes chewing gum and hard candy)  Take these medicines the morning of surgery with A SIP OF WATER: Tylenol - if needed, may use eye drops   Do not wear jewelry, make-up or nail polish.  Do not wear lotions, powders, or perfumes.  You may wear deodorant.  Do not shave 48 hours prior to surgery.    Do not bring valuables to the hospital.  [facility] is not responsible for any belongings or valuables.  Contacts, dentures or bridgework may not be worn into surgery.  Leave your suitcase in the car.  After surgery it may be brought to your room.  For patients admitted to the hospital, discharge time will be determined by your treatment team.  Any questions today, please call me at [phone]  - Thank you!           Lilia Pro, RN

## 2015-02-02 NOTE — Progress Notes (Signed)
Patient ID: [name], female   DOB: Mar 13, 1923, 81 y.o.   MRN: [id number]  Location: [facility] Provider:  Marlana Latus NP  Code Status:  DNR Goals of care: Advanced Directive information    Chief Complaint  Patient presents with  . Medical Management of Chronic Issues  . Acute Visit    UTI     HPI: Patient is a 81 y.o. female seen in the clinic at [facility] today for evaluation of  Recurrent UTI, urine culture 01/26/15 showed E Coli >100,000c/ml, Nitrofurantoin 100mg  bid started 01/30/15 for 7 days. 01/02/15-01/05/15 hospitalized for Sepsis due to Escherichia coli bacteremia/UTI treated with Cipro. 01/15/15 ED eval for patellar fracture, skin tears of knee.  Review of Systems  Constitutional: Negative.   HENT: Positive for hearing loss. Negative for sore throat.   Eyes: Negative.   Cardiovascular: Negative for chest pain, palpitations and leg swelling.  Gastrointestinal: Negative for diarrhea.  Genitourinary:       Incontinent of urine.  Musculoskeletal: Positive for joint pain.       Left knee pain. Hx of tibial plateau fracture. Osteoarthritis of the fingers. Tender in the lateral right mid -calf area. R knee in immobilizer, tender with weight bearing, mild edema right ankle and foot.   Skin: Negative.   Neurological: Negative for dizziness, tremors, speech change, seizures, weakness and headaches.  Psychiatric/Behavioral: The patient is not nervous/anxious.     Past Medical History  Diagnosis Date  . Arthritis   . Cancer (HCC)     squamous cell  . Incontinence of urine   . Allergy   . Oxygen deficiency   . GERD (gastroesophageal reflux disease)   . Squamous cell carcinoma of skin   . Female stress incontinence   . Frequent loose stools   . TIA (transient ischemic attack) 01/05/13    Patient Active Problem List   Diagnosis Date Noted  . Closed fracture of right patella 01/16/2015  . Sepsis due to Escherichia coli ([place]) 01/04/2015  . Bacteremia  01/02/2015  . UTI (lower urinary tract infection) 01/02/2015  . Hyponatremia 01/02/2015  . Carotid atherosclerosis 06/09/2014  . Hyperglycemia 06/09/2014  . Multiple pulmonary nodules 04/01/2014  . Hearing loss 01/20/2014  . Insomnia 02/08/2013  . Left shoulder pain 02/08/2013  . History of amputation of lesser toe of right foot ([place]) 01/20/2013  . TIA (transient ischemic attack) 01/13/2013  . Tibial plateau fracture 01/13/2013  . Frequent loose stools 07/01/2012  . GERD (gastroesophageal reflux disease) 03/21/2011  . Squamous cell skin cancer 03/21/2011  . Urine, incontinence, stress female 03/21/2011  . Osteoarthritis, hand 03/21/2011    Allergies  Allergen Reactions  . Penicillin G Itching    Has patient had a PCN reaction causing immediate rash, facial/tongue/throat swelling, SOB or lightheadedness with hypotension: No Has patient had a PCN reaction causing severe rash involving mucus membranes or skin necrosis: No Has patient had a PCN reaction that required hospitalization No Has patient had a PCN reaction occurring within the last 10 years: No If all of the above answers are "NO", then may proceed with Cephalosporin use.  Sarina Ill [Bactrim]     Stomach concerns.     Medications: Patient's Medications  New Prescriptions   No medications on file  Previous Medications   ACETAMINOPHEN (TYLENOL) 500 MG TABLET    Take 500 mg by mouth every 6 (six) hours as needed for mild pain, moderate pain or headache.   ASCORBIC ACID (VITAMIN C) 1000 MG TABLET  Take 1,000 mg by mouth daily.   ASPIRIN EC 81 MG TABLET    Take 81 mg by mouth daily.    B COMPLEX VITAMINS TABLET    Take 1 tablet by mouth daily.   BETA CAROTENE W/MINERALS (OCUVITE) TABLET    Take 1 tablet by mouth daily.   CALCIUM CARBONATE-VITAMIN D (CALCIUM 600 + D PO)    Take 1 tablet by mouth daily.   CIPROFLOXACIN (CIPRO) 500 MG TABLET    Take 1 tablet (500 mg total) by mouth 2 (two) times daily.   DIPHENHYDRAMINE  (SOMINEX) 25 MG TABLET    Take 25 mg by mouth every 4 (four) hours as needed for allergies.   HYDROCODONE-ACETAMINOPHEN (NORCO/VICODIN) 5-325 MG PER TABLET    Take 1 tablet by mouth every 4 (four) hours as needed.   LOPERAMIDE (IMODIUM) 2 MG CAPSULE    Take 2 mg by mouth as needed for diarrhea or loose stools.   POLYETHYL GLYCOL-PROPYL GLYCOL (SYSTANE OP)    Apply 1 drop to eye 2 (two) times daily.  Modified Medications   No medications on file  Discontinued Medications   No medications on file    Physical Exam: Filed Vitals:   02/02/15 1238  BP: 122/64  Pulse: 79  Temp: 97.6 F (36.4 C)  TempSrc: Tympanic  Resp: 18   There is no weight on file to calculate BMI.  Physical Exam  Constitutional: She is oriented to person, place, and time. She appears well-developed and well-nourished. No distress.  HENT:  Head: Normocephalic and atraumatic.  Right Ear: External ear normal.  Left Ear: External ear normal.  Nose: Nose normal.  Mouth/Throat: Oropharynx is clear and moist. No oropharyngeal exudate.  Bilateral hearing aids. Profound loss of hearing.  Eyes: Conjunctivae and EOM are normal. Pupils are equal, round, and reactive to light. Right eye exhibits no discharge. Left eye exhibits no discharge. No scleral icterus.  Neck: Normal range of motion. Neck supple. No JVD present. No tracheal deviation present. No thyromegaly present.  Cardiovascular: Normal rate, regular rhythm, normal heart sounds and intact distal pulses.   No murmur heard. Pulmonary/Chest: Effort normal and breath sounds normal. No respiratory distress. She has no wheezes. She has no rales.  Abdominal: Soft. Bowel sounds are normal. She exhibits no distension. There is no tenderness.  Musculoskeletal: She exhibits edema and tenderness.  Left knee pain.  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 Tender in the lateral aspect of the right calf muscle. No palpable venous cords. There is no  tenderness in the medial thigh or groin. No palpable lymph nodes. Patient is able to walk. Right knee in immobilizer, mild edema right ankle and foot.   Neurological: She is alert and oriented to person, place, and time. She displays normal reflexes. No cranial nerve deficit. She exhibits normal muscle tone. Coordination normal.  Skin: No rash noted. She is not diaphoretic. There is erythema. No pallor.  Brownish pigmented BLE  Psychiatric: She has a normal mood and affect. Her behavior is normal. Judgment and thought content normal.  Talks constantly and changes her subjects frequently.     Labs reviewed: Basic Metabolic Panel:  Recent Labs  01/02/15 1547 01/03/15 0604 01/04/15 0555  NA 129* 134* 135  K 4.3 4.1 3.8  CL 95* 103 105  CO2 24 24 23   GLUCOSE 113* 90 86  BUN 27* 22* 16  CREATININE 0.88 0.92 0.73  CALCIUM 8.7* 8.0* 7.7*    Liver Function  Tests:  Recent Labs  01/01/15 1735 01/02/15 1547 01/04/15 0555  AST 39 35 32  ALT 28 28 36  ALKPHOS 85 80 79  BILITOT 0.7 0.6 0.5  PROT 6.3* 6.2* 4.7*  ALBUMIN 3.3* 3.3* 2.2*    CBC:  Recent Labs  01/01/15 1554 01/01/15 1627 01/02/15 1547 01/04/15 0555  WBC 14.4*  --  14.7* 9.3  NEUTROABS 11.8*  --  12.4*  --   HGB 13.5 14.6 13.0 11.5*  HCT 39.3 43.0 36.4 34.1*  MCV 89.9  --  88.6 90.7  PLT 172  --  186 173    Lab Results  Component Value Date   TSH 1.844 01/13/2013   Lab Results  Component Value Date   HGBA1C 6.0 06/14/2014   Lab Results  Component Value Date   CHOL 174 06/14/2014   HDL 96* 06/14/2014   LDLCALC 64 06/14/2014   TRIG 69 06/14/2014   CHOLHDL 1.4 01/13/2013    Significant Diagnostic Results since last visit: none  Patient Care Team: Estill Dooms, MD as PCP - General (Internal Medicine) Dorna Leitz, MD as Consulting Physician (Orthopedic Surgery) Carol Ada, MD as Consulting Physician (Gastroenterology) Crista Luria, MD as Consulting Physician (Dermatology) [facility] as Audiologist (Audiology) Adnan Vanvoorhis X, NP as Nurse Practitioner (Nurse Practitioner)  Assessment/Plan Problem List Items Addressed This Visit    UTI (lower urinary tract infection) - Primary    01/26/15 urine culture, E Coli, complete 7 day course of Nitrofurantoin 100mg  bid started 01/30/15, repeat UA c/s upon completion of ABT      Urine, incontinence, stress female    Leakage, adult pads for urinary incontinent care.      Hyponatremia    01/24/15 Na 141      Closed fracture of right patella    01/14/15 x-ray transverse fracture identified through through the right patella.  Knee immobilizer, f/u Ortho          Family/ staff Communication: observe the patient.   Labs/tests ordered: UA C/S  [facility] Athziri Freundlich NP [facility] [address], [place] [address] On Call:  [phone] & follow prompts after 5pm & weekends Office Phone:  [phone] Office Fax:  [phone]

## 2015-02-02 NOTE — Assessment & Plan Note (Signed)
Leakage, adult pads for urinary incontinent care.

## 2015-02-03 ENCOUNTER — Encounter (HOSPITAL_COMMUNITY): Payer: Self-pay | Admitting: Surgery

## 2015-02-03 ENCOUNTER — Inpatient Hospital Stay (HOSPITAL_COMMUNITY): Payer: Medicare Other | Admitting: Certified Registered Nurse Anesthetist

## 2015-02-03 ENCOUNTER — Inpatient Hospital Stay (HOSPITAL_COMMUNITY): Payer: Medicare Other

## 2015-02-03 ENCOUNTER — Encounter (HOSPITAL_COMMUNITY)
Admission: RE | Disposition: A | Discharge status: Skilled Nursing Facility | Payer: Self-pay | Source: Ambulatory Visit | Attending: Orthopedic Surgery

## 2015-02-03 ENCOUNTER — Inpatient Hospital Stay (HOSPITAL_COMMUNITY)
Admission: RE | Admit: 2015-02-03 | Discharge: 2015-02-06 | DRG: 489 | Disposition: A | Discharge status: Skilled Nursing Facility | Payer: Medicare Other | Source: Ambulatory Visit | Attending: Orthopedic Surgery | Admitting: Orthopedic Surgery

## 2015-02-03 DIAGNOSIS — Z01818 Encounter for other preprocedural examination: Secondary | ICD-10-CM | POA: Diagnosis not present

## 2015-02-03 DIAGNOSIS — Z880 Allergy status to penicillin: Secondary | ICD-10-CM

## 2015-02-03 DIAGNOSIS — K219 Gastro-esophageal reflux disease without esophagitis: Secondary | ICD-10-CM | POA: Diagnosis present

## 2015-02-03 DIAGNOSIS — Z881 Allergy status to other antibiotic agents status: Secondary | ICD-10-CM

## 2015-02-03 DIAGNOSIS — S82031A Displaced transverse fracture of right patella, initial encounter for closed fracture: Secondary | ICD-10-CM | POA: Diagnosis not present

## 2015-02-03 DIAGNOSIS — W19XXXA Unspecified fall, initial encounter: Secondary | ICD-10-CM | POA: Diagnosis present

## 2015-02-03 DIAGNOSIS — S82031D Displaced transverse fracture of right patella, subsequent encounter for closed fracture with routine healing: Secondary | ICD-10-CM | POA: Diagnosis not present

## 2015-02-03 DIAGNOSIS — Z87891 Personal history of nicotine dependence: Secondary | ICD-10-CM | POA: Diagnosis not present

## 2015-02-03 DIAGNOSIS — M6281 Muscle weakness (generalized): Secondary | ICD-10-CM | POA: Diagnosis not present

## 2015-02-03 DIAGNOSIS — G4700 Insomnia, unspecified: Secondary | ICD-10-CM | POA: Diagnosis present

## 2015-02-03 DIAGNOSIS — R1312 Dysphagia, oropharyngeal phase: Secondary | ICD-10-CM | POA: Diagnosis not present

## 2015-02-03 DIAGNOSIS — Z4789 Encounter for other orthopedic aftercare: Secondary | ICD-10-CM | POA: Diagnosis not present

## 2015-02-03 DIAGNOSIS — Z79899 Other long term (current) drug therapy: Secondary | ICD-10-CM | POA: Diagnosis not present

## 2015-02-03 DIAGNOSIS — Z8673 Personal history of transient ischemic attack (TIA), and cerebral infarction without residual deficits: Secondary | ICD-10-CM | POA: Diagnosis not present

## 2015-02-03 DIAGNOSIS — S82001B Unspecified fracture of right patella, initial encounter for open fracture type I or II: Secondary | ICD-10-CM | POA: Diagnosis not present

## 2015-02-03 DIAGNOSIS — M1990 Unspecified osteoarthritis, unspecified site: Secondary | ICD-10-CM | POA: Diagnosis not present

## 2015-02-03 DIAGNOSIS — Z7982 Long term (current) use of aspirin: Secondary | ICD-10-CM

## 2015-02-03 DIAGNOSIS — N393 Stress incontinence (female) (male): Secondary | ICD-10-CM | POA: Diagnosis present

## 2015-02-03 DIAGNOSIS — S82001A Unspecified fracture of right patella, initial encounter for closed fracture: Secondary | ICD-10-CM | POA: Diagnosis not present

## 2015-02-03 DIAGNOSIS — R2681 Unsteadiness on feet: Secondary | ICD-10-CM | POA: Diagnosis not present

## 2015-02-03 DIAGNOSIS — R531 Weakness: Secondary | ICD-10-CM | POA: Diagnosis not present

## 2015-02-03 DIAGNOSIS — Z4781 Encounter for orthopedic aftercare following surgical amputation: Secondary | ICD-10-CM | POA: Diagnosis not present

## 2015-02-03 HISTORY — DX: Insomnia, unspecified: G47.00

## 2015-02-03 HISTORY — PX: ORIF PATELLA: SHX5033

## 2015-02-03 HISTORY — DX: Sepsis due to Escherichia coli (e. coli): A41.51

## 2015-02-03 HISTORY — DX: Cerebral infarction, unspecified: I63.9

## 2015-02-03 LAB — COMPREHENSIVE METABOLIC PANEL
ALBUMIN: 3.6 g/dL (ref 3.5–5.0)
ALT: 19 U/L (ref 14–54)
AST: 27 U/L (ref 15–41)
Alkaline Phosphatase: 64 U/L (ref 38–126)
Anion gap: 10 (ref 5–15)
BUN: 20 mg/dL (ref 6–20)
CHLORIDE: 101 mmol/L (ref 101–111)
CO2: 25 mmol/L (ref 22–32)
CREATININE: 0.73 mg/dL (ref 0.44–1.00)
Calcium: 8.8 mg/dL — ABNORMAL LOW (ref 8.9–10.3)
GFR calc Af Amer: >60 mL/min (ref >60)
GFR calc non Af Amer: >60 mL/min (ref >60)
GLUCOSE: 92 mg/dL (ref 65–99)
Potassium: 4.3 mmol/L (ref 3.5–5.1)
SODIUM: 136 mmol/L (ref 135–145)
Total Bilirubin: 1 mg/dL (ref 0.3–1.2)
Total Protein: 6 g/dL — ABNORMAL LOW (ref 6.5–8.1)

## 2015-02-03 LAB — CBC WITH DIFFERENTIAL/PLATELET
Basophils Absolute: 0 10*3/uL (ref 0.0–0.1)
Basophils Relative: 0 %
EOS ABS: 0 10*3/uL (ref 0.0–0.7)
EOS PCT: 0 %
HCT: 40 % (ref 36.0–46.0)
Hemoglobin: 13.1 g/dL (ref 12.0–15.0)
LYMPHS ABS: 1.6 10*3/uL (ref 0.7–4.0)
LYMPHS PCT: 22 %
MCH: 30 pg (ref 26.0–34.0)
MCHC: 32.8 g/dL (ref 30.0–36.0)
MCV: 91.5 fL (ref 78.0–100.0)
MONOS PCT: 9 %
Monocytes Absolute: 0.6 10*3/uL (ref 0.1–1.0)
Neutro Abs: 5 10*3/uL (ref 1.7–7.7)
Neutrophils Relative %: 69 %
PLATELETS: 231 10*3/uL (ref 150–400)
RBC: 4.37 MIL/uL (ref 3.87–5.11)
RDW: 15 % (ref 11.5–15.5)
WBC: 7.3 10*3/uL (ref 4.0–10.5)

## 2015-02-03 LAB — PROTIME-INR
INR: 1.07 (ref 0.00–1.49)
Prothrombin Time: 14.1 seconds (ref 11.6–15.2)

## 2015-02-03 LAB — TYPE AND SCREEN
ABO/RH(D): O POS
Antibody Screen: NEGATIVE

## 2015-02-03 LAB — ABO/RH: ABO/RH(D): O POS

## 2015-02-03 LAB — APTT: APTT: 28 s (ref 24–37)

## 2015-02-03 SURGERY — OPEN REDUCTION INTERNAL FIXATION (ORIF) PATELLA
Anesthesia: Monitor Anesthesia Care | Site: Knee | Laterality: Right

## 2015-02-03 MED ORDER — CEFAZOLIN SODIUM 1-5 GM-% IV SOLN
1.0000 g | Freq: Two times a day (BID) | INTRAVENOUS | Status: AC
  Administered 2015-02-04 (×2): 1 g via INTRAVENOUS
  Filled 2015-02-03 (×2): qty 50

## 2015-02-03 MED ORDER — ONDANSETRON HCL 4 MG/2ML IJ SOLN: 4.0000 mg | Freq: Once | INTRAMUSCULAR | Status: DC | PRN

## 2015-02-03 MED ORDER — ONDANSETRON HCL 4 MG PO TABS: 4.0000 mg | ORAL_TABLET | Freq: Four times a day (QID) | ORAL | Status: DC | PRN

## 2015-02-03 MED ORDER — HYDROMORPHONE HCL 1 MG/ML IJ SOLN
0.2500 mg | INTRAMUSCULAR | Status: DC | PRN
  Administered 2015-02-03: 0.5 mg via INTRAVENOUS
  Filled 2015-02-03: qty 1

## 2015-02-03 MED ORDER — SODIUM CHLORIDE 0.9 % IV SOLN
INTRAVENOUS | Status: DC
  Administered 2015-02-03: 50 mL/h via INTRAVENOUS
  Administered 2015-02-04: 15:00:00 via INTRAVENOUS

## 2015-02-03 MED ORDER — DEXTROSE 5 % IV SOLN
10.0000 mg | INTRAVENOUS | Status: DC | PRN
  Administered 2015-02-03: 10 ug/min via INTRAVENOUS

## 2015-02-03 MED ORDER — HYDROCODONE-ACETAMINOPHEN 5-325 MG PO TABS
ORAL_TABLET | ORAL | Status: AC
  Filled 2015-02-03: qty 1

## 2015-02-03 MED ORDER — FENTANYL CITRATE (PF) 250 MCG/5ML IJ SOLN
INTRAMUSCULAR | Status: AC
  Filled 2015-02-03: qty 5

## 2015-02-03 MED ORDER — PROPOFOL 500 MG/50ML IV EMUL
INTRAVENOUS | Status: DC | PRN
  Administered 2015-02-03: 15 ug/kg/min via INTRAVENOUS

## 2015-02-03 MED ORDER — BISACODYL 5 MG PO TBEC: 5.0000 mg | DELAYED_RELEASE_TABLET | Freq: Every day | ORAL | Status: DC | PRN

## 2015-02-03 MED ORDER — LACTATED RINGERS IV SOLN
INTRAVENOUS | Status: DC
  Administered 2015-02-03: 13:00:00 via INTRAVENOUS

## 2015-02-03 MED ORDER — ACETAMINOPHEN 325 MG PO TABS: 650.0000 mg | ORAL_TABLET | Freq: Four times a day (QID) | ORAL | Status: DC | PRN

## 2015-02-03 MED ORDER — SODIUM CHLORIDE (HYPERTONIC) 5 % OP OINT
1.0000 "application " | TOPICAL_OINTMENT | Freq: Every day | OPHTHALMIC | Status: DC
  Filled 2015-02-03: qty 3.5

## 2015-02-03 MED ORDER — MIDAZOLAM HCL 2 MG/2ML IJ SOLN
INTRAMUSCULAR | Status: AC
  Filled 2015-02-03: qty 4

## 2015-02-03 MED ORDER — POLYETHYL GLYCOL-PROPYL GLYCOL 0.4-0.3 % OP GEL
1.0000 "application " | Freq: Every day | OPHTHALMIC | Status: DC
  Filled 2015-02-03: qty 10

## 2015-02-03 MED ORDER — FENTANYL CITRATE (PF) 100 MCG/2ML IJ SOLN
INTRAMUSCULAR | Status: AC
  Filled 2015-02-03: qty 2

## 2015-02-03 MED ORDER — ACETAMINOPHEN 650 MG RE SUPP: 650.0000 mg | Freq: Four times a day (QID) | RECTAL | Status: DC | PRN

## 2015-02-03 MED ORDER — GLYCOPYRROLATE 0.2 MG/ML IJ SOLN
INTRAMUSCULAR | Status: DC | PRN
  Administered 2015-02-03: 0.2 mg via INTRAVENOUS

## 2015-02-03 MED ORDER — LOPERAMIDE HCL 2 MG PO CAPS
2.0000 mg | ORAL_CAPSULE | ORAL | Status: DC | PRN
  Filled 2015-02-03: qty 1

## 2015-02-03 MED ORDER — POLYETHYLENE GLYCOL 3350 17 G PO PACK: 17.0000 g | PACK | Freq: Every day | ORAL | Status: DC | PRN

## 2015-02-03 MED ORDER — FENTANYL CITRATE (PF) 100 MCG/2ML IJ SOLN
INTRAMUSCULAR | Status: DC | PRN
  Administered 2015-02-03: 50 ug via INTRAVENOUS

## 2015-02-03 MED ORDER — 0.9 % SODIUM CHLORIDE (POUR BTL) OPTIME
TOPICAL | Status: DC | PRN
  Administered 2015-02-03: 1000 mL

## 2015-02-03 MED ORDER — ASPIRIN EC 81 MG PO TBEC
81.0000 mg | DELAYED_RELEASE_TABLET | Freq: Every day | ORAL | Status: DC
  Administered 2015-02-03 – 2015-02-06 (×5): 81 mg via ORAL
  Filled 2015-02-03 (×4): qty 1

## 2015-02-03 MED ORDER — HYDROCODONE-ACETAMINOPHEN 5-325 MG PO TABS
1.0000 | ORAL_TABLET | ORAL | Status: DC | PRN
  Administered 2015-02-03 – 2015-02-06 (×8): 1 via ORAL
  Filled 2015-02-03 (×7): qty 1

## 2015-02-03 MED ORDER — DOCUSATE SODIUM 100 MG PO CAPS
100.0000 mg | ORAL_CAPSULE | Freq: Two times a day (BID) | ORAL | Status: DC
  Administered 2015-02-03 – 2015-02-05 (×4): 100 mg via ORAL
  Filled 2015-02-03 (×6): qty 1

## 2015-02-03 MED ORDER — TRAMADOL HCL 50 MG PO TABS
50.0000 mg | ORAL_TABLET | Freq: Four times a day (QID) | ORAL | Status: DC | PRN
  Administered 2015-02-04 – 2015-02-05 (×3): 50 mg via ORAL
  Filled 2015-02-03 (×3): qty 1

## 2015-02-03 MED ORDER — PROPOFOL 10 MG/ML IV BOLUS
INTRAVENOUS | Status: AC
  Filled 2015-02-03: qty 20

## 2015-02-03 MED ORDER — ACETAMINOPHEN 325 MG PO TABS: 650.0000 mg | ORAL_TABLET | ORAL | Status: DC | PRN

## 2015-02-03 MED ORDER — POLYVINYL ALCOHOL 1.4 % OP SOLN
1.0000 [drp] | Freq: Three times a day (TID) | OPHTHALMIC | Status: DC
  Filled 2015-02-03: qty 15

## 2015-02-03 MED ORDER — NITROFURANTOIN MACROCRYSTAL 100 MG PO CAPS
100.0000 mg | ORAL_CAPSULE | Freq: Two times a day (BID) | ORAL | Status: DC
  Administered 2015-02-03 – 2015-02-04 (×2): 100 mg via ORAL
  Filled 2015-02-03 (×3): qty 1

## 2015-02-03 MED ORDER — SACCHAROMYCES BOULARDII 250 MG PO CAPS
250.0000 mg | ORAL_CAPSULE | Freq: Two times a day (BID) | ORAL | Status: DC
  Administered 2015-02-03 – 2015-02-06 (×6): 250 mg via ORAL
  Filled 2015-02-03 (×6): qty 1

## 2015-02-03 MED ORDER — POLYVINYL ALCOHOL 1.4 % OP SOLN
1.0000 [drp] | Freq: Four times a day (QID) | OPHTHALMIC | Status: DC
  Administered 2015-02-03 – 2015-02-06 (×10): 1 [drp] via OPHTHALMIC
  Filled 2015-02-03: qty 15

## 2015-02-03 MED ORDER — ONDANSETRON HCL 4 MG/2ML IJ SOLN: 4.0000 mg | Freq: Four times a day (QID) | INTRAMUSCULAR | Status: DC | PRN

## 2015-02-03 MED ORDER — TRAMADOL HCL 50 MG PO TABS: 50.0000 mg | ORAL_TABLET | Freq: Four times a day (QID) | ORAL | Status: DC | PRN

## 2015-02-03 MED ORDER — FENTANYL CITRATE (PF) 100 MCG/2ML IJ SOLN
25.0000 ug | INTRAMUSCULAR | Status: DC | PRN
  Administered 2015-02-03: 25 ug via INTRAVENOUS
  Administered 2015-02-03: 50 ug via INTRAVENOUS
  Administered 2015-02-03: 25 ug via INTRAVENOUS

## 2015-02-03 MED ORDER — PROPOFOL 10 MG/ML IV BOLUS
INTRAVENOUS | Status: DC | PRN
  Administered 2015-02-03: 10 mg via INTRAVENOUS
  Administered 2015-02-03: 20 mg via INTRAVENOUS
  Administered 2015-02-03: 10 mg via INTRAVENOUS
  Administered 2015-02-03: 20 mg via INTRAVENOUS
  Administered 2015-02-03 (×2): 10 mg via INTRAVENOUS

## 2015-02-03 SURGICAL SUPPLY — 66 items
BANDAGE ELASTIC 4 VELCRO ST LF (GAUZE/BANDAGES/DRESSINGS) ×3 IMPLANT
BANDAGE ELASTIC 6 VELCRO ST LF (GAUZE/BANDAGES/DRESSINGS) ×3 IMPLANT
BANDAGE ESMARK 6X9 LF (GAUZE/BANDAGES/DRESSINGS) ×1 IMPLANT
BLADE SURG ROTATE 9660 (MISCELLANEOUS) IMPLANT
BNDG COHESIVE 6X5 TAN STRL LF (GAUZE/BANDAGES/DRESSINGS) IMPLANT
BNDG ESMARK 6X9 LF (GAUZE/BANDAGES/DRESSINGS) ×3
CANISTER SUCTION WELLS/JOHNSON (MISCELLANEOUS) ×3 IMPLANT
COVER MAYO STAND STRL (DRAPES) ×3 IMPLANT
COVER SURGICAL LIGHT HANDLE (MISCELLANEOUS) ×3 IMPLANT
CUFF TOURNIQUET SINGLE 34IN LL (TOURNIQUET CUFF) ×3 IMPLANT
CUFF TOURNIQUET SINGLE 44IN (TOURNIQUET CUFF) IMPLANT
DRAPE C-ARM 42X72 X-RAY (DRAPES) IMPLANT
DRAPE U-SHAPE 47X51 STRL (DRAPES) ×3 IMPLANT
DRILL BIT 7/64X5 (BIT) ×3 IMPLANT
DRSG ADAPTIC 3X8 NADH LF (GAUZE/BANDAGES/DRESSINGS) IMPLANT
DRSG AQUACEL AG ADV 3.5X10 (GAUZE/BANDAGES/DRESSINGS) ×3 IMPLANT
DRSG PAD ABDOMINAL 8X10 ST (GAUZE/BANDAGES/DRESSINGS) ×3 IMPLANT
ELECT REM PT RETURN 9FT ADLT (ELECTROSURGICAL) ×3
ELECTRODE REM PT RTRN 9FT ADLT (ELECTROSURGICAL) ×1 IMPLANT
FACESHIELD WRAPAROUND (MASK) IMPLANT
GAUZE XEROFORM 1X8 LF (GAUZE/BANDAGES/DRESSINGS) ×3 IMPLANT
GAUZE XEROFORM 5X9 LF (GAUZE/BANDAGES/DRESSINGS) ×3 IMPLANT
GLOVE BIOGEL PI IND STRL 8 (GLOVE) ×2 IMPLANT
GLOVE BIOGEL PI INDICATOR 8 (GLOVE) ×4
GLOVE ECLIPSE 7.5 STRL STRAW (GLOVE) ×6 IMPLANT
GOWN STRL REUS W/ TWL LRG LVL3 (GOWN DISPOSABLE) ×2 IMPLANT
GOWN STRL REUS W/ TWL XL LVL3 (GOWN DISPOSABLE) ×2 IMPLANT
GOWN STRL REUS W/TWL LRG LVL3 (GOWN DISPOSABLE) ×4
GOWN STRL REUS W/TWL XL LVL3 (GOWN DISPOSABLE) ×4
K-WIRE PLA 9 .062 (WIRE) IMPLANT
KIT BASIN OR (CUSTOM PROCEDURE TRAY) ×3 IMPLANT
KIT ROOM TURNOVER OR (KITS) ×3 IMPLANT
MANIFOLD NEPTUNE II (INSTRUMENTS) IMPLANT
NEEDLE 1/2 CIR CATGUT .05X1.09 (NEEDLE) ×3 IMPLANT
NEEDLE 22X1 1/2 (OR ONLY) (NEEDLE) IMPLANT
NS IRRIG 1000ML POUR BTL (IV SOLUTION) ×3 IMPLANT
PACK ORTHO EXTREMITY (CUSTOM PROCEDURE TRAY) ×3 IMPLANT
PAD ARMBOARD 7.5X6 YLW CONV (MISCELLANEOUS) ×6 IMPLANT
PAD CAST 4YDX4 CTTN HI CHSV (CAST SUPPLIES) ×1 IMPLANT
PADDING CAST COTTON 4X4 STRL (CAST SUPPLIES) ×2
PADDING CAST COTTON 6X4 STRL (CAST SUPPLIES) ×3 IMPLANT
PASSER SUT SWANSON 36MM LOOP (INSTRUMENTS) ×3 IMPLANT
SPONGE LAP 4X18 X RAY DECT (DISPOSABLE) ×6 IMPLANT
STAPLER VISISTAT 35W (STAPLE) ×3 IMPLANT
STOCKINETTE IMPERVIOUS LG (DRAPES) ×3 IMPLANT
SUCTION FRAZIER TIP 10 FR DISP (SUCTIONS) ×3 IMPLANT
SUT ETHIBOND 2 V 37 (SUTURE) IMPLANT
SUT ETHIBOND 5 LR DA (SUTURE) IMPLANT
SUT ETHIBOND NAB CT1 #1 30IN (SUTURE) IMPLANT
SUT FIBERWIRE #2 38 T-5 BLUE (SUTURE) ×6
SUT STEEL 7 (SUTURE) IMPLANT
SUT VIC AB 0 CT1 27 (SUTURE)
SUT VIC AB 0 CT1 27XBRD ANBCTR (SUTURE) IMPLANT
SUT VIC AB 1 CT1 27 (SUTURE) ×2
SUT VIC AB 1 CT1 27XBRD ANBCTR (SUTURE) ×1 IMPLANT
SUT VIC AB 2-0 CT1 27 (SUTURE)
SUT VIC AB 2-0 CT1 TAPERPNT 27 (SUTURE) IMPLANT
SUTURE FIBERWR #2 38 T-5 BLUE (SUTURE) ×2 IMPLANT
SYR CONTROL 10ML LL (SYRINGE) IMPLANT
TOWEL OR 17X24 6PK STRL BLUE (TOWEL DISPOSABLE) ×3 IMPLANT
TOWEL OR 17X26 10 PK STRL BLUE (TOWEL DISPOSABLE) ×3 IMPLANT
TUBE CONNECTING 12'X1/4 (SUCTIONS) ×1
TUBE CONNECTING 12X1/4 (SUCTIONS) ×2 IMPLANT
UNDERPAD 30X30 INCONTINENT (UNDERPADS AND DIAPERS) ×3 IMPLANT
WATER STERILE IRR 1000ML POUR (IV SOLUTION) IMPLANT
YANKAUER SUCT BULB TIP NO VENT (SUCTIONS) IMPLANT

## 2015-02-03 NOTE — Brief Op Note (Signed)
02/03/2015  3:56 PM  PATIENT:  Bethanie Dicker  81 y.o. female  PRE-OPERATIVE DIAGNOSIS:  DISPLACED RIGHT PATELLA FRACTURE  POST-OPERATIVE DIAGNOSIS:  DISPLACED RIGHT PATELLA FRACTURE  PROCEDURE:  Procedure(s): OPEN REDUCTION INTERNAL (ORIF) FIXATION RIGHT PATELLA (Right)  SURGEON:  Surgeon(s) and Role:    * Dorna Leitz, MD - Primary  PHYSICIAN ASSISTANT:   ASSISTANTS: bethune   ANESTHESIA:   spinal  EBL:     BLOOD ADMINISTERED:none  DRAINS: none   LOCAL MEDICATIONS USED:  NONE  SPECIMEN:  No Specimen  DISPOSITION OF SPECIMEN:  N/A  COUNTS:  YES  TOURNIQUET:  * Missing tourniquet times found for documented tourniquets in log:  [id number] *  DICTATION: .Other Dictation: Dictation Number [id number]  PLAN OF CARE: Admit to inpatient   PATIENT DISPOSITION:  PACU - hemodynamically stable.   Delay start of Pharmacological VTE agent (>24hrs) due to surgical blood loss or risk of bleeding: no

## 2015-02-03 NOTE — Anesthesia Procedure Notes (Addendum)
Spinal Patient location during procedure: OR Start time: 02/03/2015 2:35 PM End time: 02/03/2015 2:40 PM Staffing Performed by: anesthesiologist  Preanesthetic Checklist Completed: patient identified, site marked, surgical consent, pre-op evaluation, timeout performed, IV checked, risks and benefits discussed and monitors and equipment checked Spinal Block Patient position: right lateral decubitus Prep: ChloraPrep Patient monitoring: heart rate, continuous pulse ox, cardiac monitor and blood pressure Approach: right paramedian Location: L3-4 Injection technique: single-shot Needle Needle type: Pencil-Tip  Needle gauge: 22 G Needle length: 9 cm Assessment Sensory level: T6 Additional Notes 10 mg 0.75% Bupivacaine  injected easily.  Roberts Gaudy  Procedure Name: [facility] Date/Time: 02/03/2015 2:45 PM Performed by: Vennie Homans Pre-anesthesia Checklist: Patient identified, Timeout performed, Emergency Drugs available, Suction available and Patient being monitored Oxygen Delivery Method: Simple face mask Placement Confirmation: positive ETCO2

## 2015-02-03 NOTE — Addendum Note (Signed)
Addendum  created 02/03/15 1842 by Roberts Gaudy, MD   Modules edited: Notes Section   Notes Section:  [place]: [phone]; [place]: [phone]

## 2015-02-03 NOTE — Transfer of Care (Signed)
Immediate Anesthesia Transfer of Care Note  Patient: [name]  Procedure(s) Performed: Procedure(s): OPEN REDUCTION INTERNAL (ORIF) FIXATION RIGHT PATELLA (Right)  Patient Location: PACU  Anesthesia Type:Spinal  Level of Consciousness: awake, alert , oriented, patient cooperative and responds to stimulation  Airway & Oxygen Therapy: Patient Spontanous Breathing and Patient connected to nasal cannula oxygen  Post-op Assessment: Report given to RN, Post -op Vital signs reviewed and stable and Patient able to stick tongue midline  Post vital signs: Reviewed and stable  Last Vitals:  Filed Vitals:   02/03/15 1218  BP: 146/75  Pulse: 80  Temp: 36.5 C  Resp: 18    Complications: No apparent anesthesia complications

## 2015-02-03 NOTE — Discharge Instructions (Signed)
Do not change dressing. Keep wound dry WBAT on right with knee immobilizer

## 2015-02-03 NOTE — Anesthesia Postprocedure Evaluation (Signed)
  Anesthesia Post-op Note  Patient: [name]  Procedure(s) Performed: Procedure(s): OPEN REDUCTION INTERNAL (ORIF) FIXATION RIGHT PATELLA (Right)  Patient Location: PACU  Anesthesia Type:MAC and Spinal  Level of Consciousness: awake, alert  and oriented  Airway and Oxygen Therapy: Patient Spontanous Breathing and Patient connected to nasal cannula oxygen  Post-op Pain: mild  Post-op Assessment: Post-op Vital signs reviewed, Patient's Cardiovascular Status Stable, Respiratory Function Stable, Patent Airway and Pain level controlled              Post-op Vital Signs: stable  Last Vitals:  Filed Vitals:   02/03/15 1218  BP: 146/75  Pulse: 80  Temp: 36.5 C  Resp: 18    Complications: No apparent anesthesia complications

## 2015-02-03 NOTE — Anesthesia Preprocedure Evaluation (Addendum)
Anesthesia Evaluation  Patient identified by MRN, date of birth, ID band Patient awake    Reviewed: Allergy & Precautions, NPO status , Patient's Chart, lab work & pertinent test results  Airway Mallampati: II  TM Distance: >3 FB Neck ROM: Full    Dental  (+) Teeth Intact   Pulmonary former smoker,    breath sounds clear to auscultation       Cardiovascular  Rhythm:Regular Rate:Normal     Neuro/Psych    GI/Hepatic   Endo/Other    Renal/GU      Musculoskeletal   Abdominal   Peds  Hematology   Anesthesia Other Findings   Reproductive/Obstetrics                          Anesthesia Physical Anesthesia Plan  ASA: II  Anesthesia Plan: MAC and Spinal   Post-op Pain Management:    Induction: Intravenous  Airway Management Planned: Natural Airway and Simple Face Mask  Additional Equipment:   Intra-op Plan:   Post-operative Plan:   Informed Consent: I have reviewed the patients History and Physical, chart, labs and discussed the procedure including the risks, benefits and alternatives for the proposed anesthesia with the patient or authorized representative who has indicated his/her understanding and acceptance.   Dental advisory given  Plan Discussed with: CRNA and Anesthesiologist  Anesthesia Plan Comments: (Fracture R. Patella  Plan SAB  Roberts Gaudy)       Anesthesia Quick Evaluation

## 2015-02-03 NOTE — H&P (Signed)
PREOPERATIVE H&P  Chief Complaint: r patella fracture  HPI: [name] is a 81 y.o. female who presents for evaluation of r patella fracture. It has been present for 2 weeks and has been worsening. She has failed conservative measures. Pain is rated as moderate.  Past Medical History  Diagnosis Date  . Arthritis   . Insomnia   . GERD (gastroesophageal reflux disease)   . Sepsis due to Escherichia coli (E. coli) ([place])   . Stroke [facility])     TIA  . Female stress incontinence   . Squamous cell carcinoma of skin    History reviewed. No pertinent past surgical history. Social History   Social History  . Marital Status: Widowed    Spouse Name: N/A  . Number of Children: N/A  . Years of Education: N/A   Social History Main Topics  . Smoking status: Former Smoker    Quit date: 03/20/1977  . Smokeless tobacco: Never Used  . Alcohol Use: No  . Drug Use: No  . Sexual Activity: Not Asked   Other Topics Concern  . None   Social History Narrative   History reviewed. No pertinent family history. Allergies  Allergen Reactions  . Penicillins Other (See Comments)    Per MAR  . Septra [Sulfamethoxazole-Trimethoprim] Other (See Comments)    Per MAR   Prior to Admission medications   Medication Sig Start Date End Date Taking? Authorizing Provider  acetaminophen (TYLENOL) 325 MG tablet Take 650 mg by mouth every 4 (four) hours as needed for mild pain.   Yes Historical Provider, MD  Ascorbic Acid (VITAMIN C) 1000 MG tablet Take 1,000 mg by mouth daily.   Yes Historical Provider, MD  aspirin EC 81 MG tablet Take 81 mg by mouth daily.   Yes Historical Provider, MD  Calcium Carb-Cholecalciferol (OYSTER SHELL CALCIUM + D) 500-200 MG-UNIT TABS Take 1 tablet by mouth daily.   Yes Historical Provider, MD  loperamide (IMODIUM) 2 MG capsule Take 2 mg by mouth daily.    Yes Historical Provider, MD  Multiple Vitamins-Minerals (I-VITE PO) Take 1 tablet by mouth daily.   Yes Historical  Provider, MD  nitrofurantoin (MACRODANTIN) 100 MG capsule Take 100 mg by mouth 2 (two) times daily. For 7 days 01/30/15  Yes Historical Provider, MD  Polyethyl Glycol-Propyl Glycol (SYSTANE ULTRA) 0.4-0.3 % SOLN Place 1 drop into both eyes 3 (three) times daily.   Yes Historical Provider, MD  Polyethyl Glycol-Propyl Glycol (SYSTANE) 0.4-0.3 % GEL ophthalmic gel Place 1 application into both eyes at bedtime.   Yes Historical Provider, MD  saccharomyces boulardii (FLORASTOR) 250 MG capsule Take 250 mg by mouth 2 (two) times daily. For 7 days 01/30/15  Yes Historical Provider, MD  sodium chloride (MURO 128) 5 % ophthalmic ointment Place 1 application into the left eye daily.   Yes Historical Provider, MD  vitamin B-12 (CYANOCOBALAMIN) 1000 MCG tablet Take 1,000 mcg by mouth daily.   Yes Historical Provider, MD     Positive ROS: none  All other systems have been reviewed and were otherwise negative with the exception of those mentioned in the HPI and as above.  Physical Exam: Filed Vitals:   02/03/15 1218  BP: 146/75  Pulse: 80  Temp: 97.7 F (36.5 C)  Resp: 18    General: Alert, no acute distress Cardiovascular: No pedal edema Respiratory: No cyanosis, no use of accessory musculature GI: No organomegaly, abdomen is soft and non-tender Skin: No lesions in the area of chief complaint  Neurologic: Sensation intact distally Psychiatric: Patient is competent for consent with normal mood and affect Lymphatic: No axillary or cervical lymphadenopathy  MUSCULOSKELETAL: r knee painful rom skin in reasonable repair  XRAY: displaced patella fracture Assessment/Plan: DISPLACED RIGHT PATELLA FRACTURE Plan for Procedure(s): OPEN REDUCTION INTERNAL (ORIF) FIXATION RIGHT PATELLA  The risks benefits and alternatives were discussed with the patient including but not limited to the risks of nonoperative treatment, versus surgical intervention including infection, bleeding, nerve injury, malunion,  nonunion, hardware prominence, hardware failure, need for hardware removal, blood clots, cardiopulmonary complications, morbidity, mortality, among others, and they were willing to proceed.  Predicted outcome is good, although there will be at least a six to nine month expected recovery.  Alta Corning, MD 02/03/2015 2:21 PM

## 2015-02-04 NOTE — Clinical Social Work Note (Signed)
Clinical Social Work Assessment  Patient Details  Name: [name] MRN: [id number] Date of Birth: 01/31/1923  Date of referral:  02/04/15               Reason for consult:  Discharge Planning                Permission sought to share information with:  Case Manager, Family Supports, Customer service manager Permission granted to share information::  Yes, Verbal Permission Granted  Name::      Almyra Free Dameron)  Agency::   ([place])  Relationship::   (Friend )  Contact Information:   [phone])  Housing/Transportation Living arrangements for the past 2 months:  [place] of Information:   Games developer) Patient Interpreter Needed:  None Criminal Activity/Legal Involvement Pertinent to Current Situation/Hospitalization:  No - Comment as needed Significant Relationships:  Friend Lives with:  Facility Resident Do you feel safe going back to the place where you live?  Yes Need for family participation in patient care:  No (Coment)  Care giving concerns:  Patient requiring continued therapy at skilled level.    Social Worker assessment / plan:  Holiday representative spoke with facility representative in reference to pt's return to facility. Facility reported that patient has been a resident of [facility] for several years however most recently was transferred to the [facility] 3 weeks ago. Facility reported that patient recently had a fall at facility and was planning to be relocated to SNF unit soon. PT is currently recommending SNF placement. Pt agreeable to SNF placement at [facility]. Facility has accepted. CSW to completed FL-2 and fax clinicals to [facility]. No further concerns reported at this time. CSW will continue to follow pt and pt's family for continued support and to facilitate pt's discharge needs once medically stable.   Employment status:  Retired Forensic scientist:   Medicare PT Recommendations:  [place] / Referral to community resources:  [place]  Patient/Family's Response to care:  Patient does not have family listed (friend only). Facility agreeable to short-term rehab placement.   Patient/Family's Understanding of and Emotional Response to Diagnosis, Current Treatment, and Prognosis:  CSW unclear of patient's understanding of medical intervention.   Emotional Assessment Appearance:  Appears stated age Attitude/Demeanor/Rapport:  Unable to Assess Affect (typically observed):  Unable to Assess Orientation:  Fluctuating Orientation (Suspected and/or reported Sundowners) Alcohol / Substance use:  Not Applicable Psych involvement (Current and /or in the community):  No (Comment)  Discharge Needs  Concerns to be addressed:  Care Coordination Readmission within the last 30 days:  No Current discharge risk:  Dependent with Mobility Barriers to Discharge:  Continued Medical Work up   [employer], MSW, LCSWA [phone] 02/04/2015 3:44 PM

## 2015-02-04 NOTE — Clinical Social Work Placement (Signed)
   CLINICAL SOCIAL WORK PLACEMENT  NOTE  Date:  02/04/2015  Patient Details  Name: [name] MRN: [id number] Date of Birth: Mar 10, 1923  Clinical Social Work is seeking post-discharge placement for this patient at the [place] level of care (*CSW will initial, date and re-position this form in  chart as items are completed):  Yes   Patient/family provided with [place] Work Department's list of facilities offering this level of care within the geographic area requested by the patient (or if unable, by the patient's family).  Yes   Patient/family informed of their freedom to choose among providers that offer the needed level of care, that participate in Medicare, Medicaid or managed care program needed by the patient, have an available bed and are willing to accept the patient.  Yes   Patient/family informed of [facility]'s ownership interest in [facility] and [facility], as well as of the fact that they are under no obligation to receive care at these facilities.  PASRR submitted to EDS on 02/04/15     PASRR number received on 02/04/15     Existing PASRR number confirmed on       FL2 transmitted to all facilities in geographic area requested by pt/family on 02/04/15     FL2 transmitted to all facilities within larger geographic area on       Patient informed that his/her managed care company has contracts with or will negotiate with certain facilities, including the following:        Yes   Patient/family informed of bed offers received.  Patient chooses bed at  [facility] )     Physician recommends and patient chooses bed at      Patient to be transferred to  ([place] ) on  .  Patient to be transferred to facility by       Patient family notified on   of transfer.  Name of family member notified:        PHYSICIAN Please sign FL2     Additional Comment:     _______________________________________________ Glendon Axe, MSW, LCSWA [phone] 02/04/2015 3:44 PM

## 2015-02-04 NOTE — Evaluation (Signed)
Physical Therapy Evaluation Patient Details Name: [name] MRN: [id number] DOB: May 07, 1922 Today's Date: 02/04/2015   History of Present Illness  Pt is a 81 y.o. female s/p ORIF R patella 02-03-15.  Clinical Impression  Pt admitted with above diagnosis. Pt currently with functional limitations due to the deficits listed below (see PT Problem List). Pt required min assist for bed mobility and transfers. She ambulated 175 feet with RW min guard assist with a step-through pattern. No reports of increased pain with WB through RLE. KI in place throughout eval.  Pt will benefit from skilled PT to increase their independence and safety with mobility to allow discharge to the venue listed below.  Per chart, pt will d/c to SNF at [facility].     Follow Up Recommendations Supervision/Assistance - 24 hour;SNF    Equipment Recommendations  None recommended by PT    Recommendations for Other Services       Precautions / Restrictions Precautions Precautions: Fall Required Braces or Orthoses: Knee Immobilizer - Right Knee Immobilizer - Right: On at all times Restrictions RLE Weight Bearing: Weight bearing as tolerated      Mobility  Bed Mobility Overal bed mobility: Needs Assistance Bed Mobility: Supine to Sit     Supine to sit: Min assist     General bed mobility comments: assist with RLE, verbal cues for sequencing  Transfers Overall transfer level: Needs assistance Equipment used: Rolling walker (2 wheeled) Transfers: Sit to/from Stand Sit to Stand: Min assist         General transfer comment: verbal cues for hand placement, sequencing, and safety  Ambulation/Gait Ambulation/Gait assistance: Min guard Ambulation Distance (Feet): 175 Feet Assistive device: Rolling walker (2 wheeled) Gait Pattern/deviations: Step-through pattern;Decreased stride length Gait velocity: decreased      Stairs            Wheelchair Mobility    Modified Rankin (Stroke  Patients Only)       Balance                                             Pertinent Vitals/Pain Pain Assessment: 0-10 Pain Score: 5  Pain Location: R knee Pain Descriptors / Indicators: Sore Pain Intervention(s): Monitored during session;Repositioned;RN gave pain meds during session    Home Living Family/patient expects to be discharged to:: Skilled nursing facility                 Additional Comments: Pt scheduled to d/c to [facility].    Prior Function Level of Independence: Independent               Hand Dominance        Extremity/Trunk Assessment                         Communication   Communication: No difficulties  Cognition Arousal/Alertness: Awake/alert Behavior During Therapy: WFL for tasks assessed/performed Overall Cognitive Status: Within Functional Limits for tasks assessed                      General Comments      Exercises        Assessment/Plan    PT Assessment Patient needs continued PT services  PT Diagnosis Difficulty walking;Acute pain   PT Problem List Decreased strength;Decreased balance;Decreased activity tolerance;Decreased mobility;Decreased knowledge of precautions;Pain;Decreased safety  awareness  PT Treatment Interventions DME instruction;Gait training;Functional mobility training;Therapeutic activities;Therapeutic exercise;Patient/family education;Balance training   PT Goals (Current goals can be found in the Care Plan section) Acute Rehab PT Goals Patient Stated Goal: return to independent living apt PT Goal Formulation: With patient Time For Goal Achievement: 02/18/15 Potential to Achieve Goals: Good    Frequency Min 3X/week   Barriers to discharge        Co-evaluation               End of Session Equipment Utilized During Treatment: Gait belt;Right knee immobilizer Activity Tolerance: Patient tolerated treatment well Patient left: in chair;with call  bell/phone within reach Nurse Communication: Mobility status         Time: 3254-9826 PT Time Calculation (min) (ACUTE ONLY): 35 min   Charges:   PT Evaluation $Initial PT Evaluation Tier I: 1 Procedure PT Treatments $Gait Training: 8-22 mins   PT G Codes:        Lorriane Shire 02/04/2015, 1:47 PM

## 2015-02-04 NOTE — Op Note (Signed)
NAMEJACALYNN, [name] NO.:  [id number]  MEDICAL RECORD NO.:  [id number]  LOCATION:  [id number]                        FACILITY:  [facility]  PHYSICIAN:  Alta Corning, M.D.   DATE OF BIRTH:  April 08, 1923  DATE OF PROCEDURE:  02/03/2015 DATE OF DISCHARGE:                              OPERATIVE REPORT   PREOPERATIVE DIAGNOSIS:  Patellar fracture, 46 weeks old.  POSTOPERATIVE DIAGNOSIS:  Patellar fracture, 49 weeks old.  PROCEDURE: 1. Repair of patellar fracture by excision of inferior patellar fragment and patellar tendon repair to the remaining fragment. 2. Interpretation of multiple intra-operative fluoroscopic images  SURGEON:  Alta Corning, MD  ASSISTANT:  Gary Fleet, PA  ANESTHESIA:  Spinal.  BRIEF HISTORY:  Mrs. Pounds is a 81 year old female with a history of having had a fall onto her right knee.  She suffered a patellar fracture which was initially mildly displaced.  We felt it might be amenable to nonoperative treatment.  We put her into a knee immobilizer.  She had significant abrasions of full-thickness over the anterior aspect of her knee, so we put dressings on this and tried to watch her. Unfortunately, this displaced and angulated and we felt that it needed some sort of fixation thought about the ability to fix it, if we get anatomic reconstruction and get some large screws across there, but we were also prepared as well to excise the fragment and advanced the patellar tendon if necessary.  She was brought to the operating for this evaluation of the procedure.  DESCRIPTION OF PROCEDURE:  The patient was brought to the operating room after adequate anesthesia was obtained with general anesthetic, patient was placed supine on the operating table.  The right leg was prepped and draped in usual sterile fashion.  Following this, the midline incision was made with subcutaneous tissue down the level of the extensor mechanism.  The patellar fracture was  easily identified and was widely displaced and gapped.  The medial retinaculum was torn in both directions.  Once this was identified, we debrided the edges of the bone and then tried a manipulative closed reduction.  We could get the posterior surface of the patella lined up reasonably, but really could not get the anterior surface to line up, I think some resorption of bone had taken place at the fracture site.  At this point, I felt that the fixation with cannulated screws would be difficult, I thought we could probably use K-wires, but I thought it might be better just to excising the fragment and advancing the patellar tendon, so this is what we did excising inferior fragments, took two #2 FiberWire passed them in a banal type fashion through the patellar tendon and then drilled 3 drill holes from the posterior aspect of the patella down to the superior aspects of the patella and passed two #2 FiberWires out of the central hole and a single out of the medial and lateral hole.  These were held in place, tied.  Excellent reconstruction of the patellar tendon to a large cancellous bed of bone was achieved.  With this technique, the knee could range to 90 degrees.  At this point, we closed the patellar retinaculum,  and closed the paratenon and then with the FiberWire, stitches were covered over with some Vicryl interrupted stitches.  At this point, an excellent repair had been achieved and again we could get 90 degrees of easy range of motion at this point, with no tendency towards gapping of the repair.  The wound at this point was irrigated, suctioned dry, closed in layers and staples for the skin.  Sterile compressive dressing was applied as well as knee immobilizer.  The patient was taken to the recovery, was noted to be in satisfactory condition.  Estimated blood loss for the procedure was amenable.     Alta Corning, M.D.     Corliss Skains  D:  02/03/2015  T:  02/04/2015  Job:   [id number]

## 2015-02-04 NOTE — Progress Notes (Signed)
Subjective: 1 Day Post-Op Procedure(s) (LRB): OPEN REDUCTION INTERNAL (ORIF) FIXATION RIGHT PATELLA (Right)  Activity level:  WBAT in knee immobilizer Diet tolerance:  ok Voiding:  ok Patient reports pain as mild and moderate.    Objective: Vital signs in last 24 hours: Temp:  [97.5 F (36.4 C)-98.4 F (36.9 C)] 98.1 F (36.7 C) (10/15 0720) Pulse Rate:  [52-80] 71 (10/15 0720) Resp:  [11-29] 17 (10/15 0720) BP: (135-164)/(52-91) 143/91 mmHg (10/15 0720) SpO2:  [96 %-100 %] 100 % (10/15 0720) Weight:  [43.545 kg (96 lb)] 43.545 kg (96 lb) (10/14 1218)  Labs:  Recent Labs  02/03/15 1251  HGB 13.1    Recent Labs  02/03/15 1251  WBC 7.3  RBC 4.37  HCT 40.0  PLT 231    Recent Labs  02/03/15 1251  NA 136  K 4.3  CL 101  CO2 25  BUN 20  CREATININE 0.73  GLUCOSE 92  CALCIUM 8.8*    Recent Labs  02/03/15 1251  INR 1.07    Physical Exam:  Neurologically intact ABD soft Neurovascular intact Sensation intact distally Intact pulses distally Dorsiflexion/Plantar flexion intact Incision: dressing C/D/I and no drainage No cellulitis present Compartment soft  Assessment/Plan:  1 Day Post-Op Procedure(s) (LRB): OPEN REDUCTION INTERNAL (ORIF) FIXATION RIGHT PATELLA (Right) Advance diet Up with therapy  [facility] Continue WBAT in knee immobilizer.    [name], Larwance Sachs 02/04/2015, 7:39 AM

## 2015-02-05 NOTE — Progress Notes (Signed)
Utilization review completed.  

## 2015-02-05 NOTE — Progress Notes (Signed)
Subjective: 2 Days Post-Op Procedure(s) (LRB): OPEN REDUCTION INTERNAL (ORIF) FIXATION RIGHT PATELLA (Right)  Activity level:  wbat in knee immobilizer Diet tolerance:  ok Voiding:  ok Patient reports pain as mild.    Objective: Vital signs in last 24 hours: Temp:  [97.5 F (36.4 C)-97.9 F (36.6 C)] 97.9 F (36.6 C) (10/16 0300) Pulse Rate:  [66-69] 66 (10/16 0300) Resp:  [16-17] 16 (10/16 0300) BP: (143-148)/(55-77) 148/55 mmHg (10/16 0300) SpO2:  [97 %-99 %] 99 % (10/16 0300)  Labs:  Recent Labs  02/03/15 1251  HGB 13.1    Recent Labs  02/03/15 1251  WBC 7.3  RBC 4.37  HCT 40.0  PLT 231    Recent Labs  02/03/15 1251  NA 136  K 4.3  CL 101  CO2 25  BUN 20  CREATININE 0.73  GLUCOSE 92  CALCIUM 8.8*    Recent Labs  02/03/15 1251  INR 1.07    Physical Exam:  Neurologically intact ABD soft Neurovascular intact Sensation intact distally Intact pulses distally Dorsiflexion/Plantar flexion intact Incision: dressing C/D/I and no drainage No cellulitis present Compartment soft  Assessment/Plan:  2 Days Post-Op Procedure(s) (LRB): OPEN REDUCTION INTERNAL (ORIF) FIXATION RIGHT PATELLA (Right) Advance diet Up with therapy Discharge to SNF [facility]. Continue WBAT in knee immobilizer      Rishik Tubby, Larwance Sachs 02/05/2015, 9:16 AM

## 2015-02-05 NOTE — Progress Notes (Signed)
This patient is a 81 year old female that is very needy. I believe she does not realize there are other patients we must care for. I tried to meet her needs each and every time she requested assistance.  It seems as if she would like one on one care.  She also seems to get confused with the time.  She would call out and state it has been an hour before someone has answered her request and an hour has not passed. However this is not unusual for an 81 year old patient.

## 2015-02-06 ENCOUNTER — Encounter (HOSPITAL_COMMUNITY): Payer: Self-pay | Admitting: Orthopedic Surgery

## 2015-02-06 DIAGNOSIS — G4700 Insomnia, unspecified: Secondary | ICD-10-CM | POA: Diagnosis not present

## 2015-02-06 DIAGNOSIS — Z4781 Encounter for orthopedic aftercare following surgical amputation: Secondary | ICD-10-CM | POA: Diagnosis not present

## 2015-02-06 DIAGNOSIS — R197 Diarrhea, unspecified: Secondary | ICD-10-CM | POA: Diagnosis not present

## 2015-02-06 DIAGNOSIS — S82031D Displaced transverse fracture of right patella, subsequent encounter for closed fracture with routine healing: Secondary | ICD-10-CM | POA: Diagnosis not present

## 2015-02-06 DIAGNOSIS — Z9889 Other specified postprocedural states: Secondary | ICD-10-CM | POA: Diagnosis not present

## 2015-02-06 DIAGNOSIS — M6281 Muscle weakness (generalized): Secondary | ICD-10-CM | POA: Diagnosis not present

## 2015-02-06 DIAGNOSIS — R531 Weakness: Secondary | ICD-10-CM | POA: Diagnosis not present

## 2015-02-06 DIAGNOSIS — R918 Other nonspecific abnormal finding of lung field: Secondary | ICD-10-CM | POA: Diagnosis not present

## 2015-02-06 DIAGNOSIS — M25561 Pain in right knee: Secondary | ICD-10-CM | POA: Diagnosis not present

## 2015-02-06 DIAGNOSIS — Z9181 History of falling: Secondary | ICD-10-CM | POA: Diagnosis not present

## 2015-02-06 DIAGNOSIS — S82001S Unspecified fracture of right patella, sequela: Secondary | ICD-10-CM | POA: Diagnosis not present

## 2015-02-06 DIAGNOSIS — R2681 Unsteadiness on feet: Secondary | ICD-10-CM | POA: Diagnosis not present

## 2015-02-06 DIAGNOSIS — Z4789 Encounter for other orthopedic aftercare: Secondary | ICD-10-CM | POA: Diagnosis not present

## 2015-02-06 DIAGNOSIS — K219 Gastro-esophageal reflux disease without esophagitis: Secondary | ICD-10-CM | POA: Diagnosis not present

## 2015-02-06 DIAGNOSIS — N390 Urinary tract infection, site not specified: Secondary | ICD-10-CM | POA: Diagnosis not present

## 2015-02-06 DIAGNOSIS — R1312 Dysphagia, oropharyngeal phase: Secondary | ICD-10-CM | POA: Diagnosis not present

## 2015-02-06 DIAGNOSIS — S82001A Unspecified fracture of right patella, initial encounter for closed fracture: Secondary | ICD-10-CM | POA: Diagnosis not present

## 2015-02-06 MED ORDER — TRAMADOL HCL 50 MG PO TABS
50.0000 mg | ORAL_TABLET | Freq: Four times a day (QID) | ORAL | Status: DC | PRN
Start: 2015-02-06 — End: 2015-07-06

## 2015-02-06 NOTE — Clinical Social Work Placement (Signed)
   CLINICAL SOCIAL WORK PLACEMENT  NOTE  Date:  02/06/2015  Patient Details  Name: [name] MRN: [id number] Date of Birth: 16-Dec-1922  Clinical Social Work is seeking post-discharge placement for this patient at the [place] level of care (*CSW will initial, date and re-position this form in  chart as items are completed):  Yes   Patient/family provided with [facility] Work Department's list of facilities offering this level of care within the geographic area requested by the patient (or if unable, by the patient's family).  Yes   Patient/family informed of their freedom to choose among providers that offer the needed level of care, that participate in Medicare, Medicaid or managed care program needed by the patient, have an available bed and are willing to accept the patient.  Yes   Patient/family informed of [facility]'s ownership interest in [facility] and [facility], as well as of the fact that they are under no obligation to receive care at these facilities.  PASRR submitted to EDS on 02/04/15     PASRR number received on 02/04/15     Existing PASRR number confirmed on       FL2 transmitted to all facilities in geographic area requested by pt/family on 02/04/15     FL2 transmitted to all facilities within larger geographic area on       Patient informed that his/her managed care company has contracts with or will negotiate with certain facilities, including the following:        Yes   Patient/family informed of bed offers received.  Patient chooses bed at  [facility] )     Physician recommends and patient chooses bed at      Patient to be transferred to  ([facility] ) on 02/06/15.  Patient to be transferred to facility by EMS     Patient family notified on 02/06/15 of transfer.  Name of family member notified:  Patient at bedside.     PHYSICIAN       Additional Comment:     _______________________________________________ Caroline Sauger, LCSW 02/06/2015, 12:11 PM

## 2015-02-06 NOTE — Progress Notes (Signed)
OT Cancellation Note  Patient Details Name: [name] MRN: [id number] DOB: 1922-10-14   Cancelled Treatment:    Reason Eval/Treat Not Completed: Other (comment) (defer OT to next venue). Per chart and PT eval, pt planning to d/c to SNF at [facility]. Will defer OT needs to next venue of care, signing off for OT at this time. Please re-consult if change in medical status occurs. Thank you for this referral.    Binnie Kand M.S., OTR/L Pager: [phone]  02/06/2015, 8:09 AM

## 2015-02-06 NOTE — Discharge Planning (Signed)
Patient to return to [facility]. Patient updated regarding discharge.  PASARR obtained from [place]: [phone] [place]: [place] RN report number: [phone] Transportation: EMS  [facility] Say[facility], [facility] [phone]) and Surgical [phone])

## 2015-02-06 NOTE — Progress Notes (Signed)
Subjective: 3 Days Post-Op Procedure(s) (LRB): OPEN REDUCTION INTERNAL (ORIF) FIXATION RIGHT PATELLA (Right) Patient reports pain as mild. Taking by mouth and voiding okay.   Objective: Vital signs in last 24 hours: Temp:  [97.6 F (36.4 C)-98.1 F (36.7 C)] 98.1 F (36.7 C) (10/17 0402) Pulse Rate:  [68-85] 79 (10/17 0402) Resp:  [16-17] 16 (10/17 0402) BP: (144-171)/(56-67) 164/56 mmHg (10/17 0402) SpO2:  [96 %-98 %] 96 % (10/17 0402)  Intake/Output from previous day: 10/16 0701 - 10/17 0700 In: 480 [P.O.:480] Out: -  Intake/Output this shift:     Recent Labs  02/03/15 1251  HGB 13.1    Recent Labs  02/03/15 1251  WBC 7.3  RBC 4.37  HCT 40.0  PLT 231    Recent Labs  02/03/15 1251  NA 136  K 4.3  CL 101  CO2 25  BUN 20  CREATININE 0.73  GLUCOSE 92  CALCIUM 8.8*    Recent Labs  02/03/15 1251  INR 1.07   Right lower extremity exam: Knee immobilizer is intact. Dressing is clean and dry. Calf is soft and nontender. She moves her ankle actively. Distal pulses 1+. Normal sensation distally. Patient is sitting up in a chair. She is eating breakfast.   Assessment/Plan: 3 Days Post-Op Procedure(s) (LRB): OPEN REDUCTION INTERNAL (ORIF) FIXATION RIGHT PATELLA (Right) Plan: Discharge to SNF Weight-bear as tolerated on right with knee immobilizer. Physical therapy can work on gentle range of motion 0 to 30. Follow-up with Dr. Berenice Primas in 2 weeks. She will need a right knee dressing change in 4 days. Xeroform will need to be reapplied as her skin is very friable and tears easily.  [place] G 02/06/2015, 8:19 AM

## 2015-02-06 NOTE — Care Management Important Message (Signed)
Important Message  Patient Details  Name: [name] MRN: [id number] Date of Birth: [birth year]   Medicare Important Message Given:  Yes-second notification given    Delorse Lek 02/06/2015, 1:34 PM

## 2015-02-06 NOTE — Discharge Summary (Signed)
Patient ID: [name] MRN: [id number] DOB/AGE: 05/18/1922 81 y.o.  Admit date: 02/03/2015 Discharge date: 02/06/2015  Admission Diagnoses:  Principal Problem:   Closed displaced transverse fracture of right patella Active Problems:   Displaced transverse fracture of right patella   Discharge Diagnoses:  Same  Past Medical History  Diagnosis Date  . Arthritis   . Insomnia   . GERD (gastroesophageal reflux disease)   . Sepsis due to Escherichia coli (E. coli) ([place])   . Stroke [facility])     TIA  . Female stress incontinence   . Squamous cell carcinoma of skin     Surgeries: Procedure(s): OPEN REDUCTION INTERNAL (ORIF) FIXATION RIGHT PATELLA on 02/03/2015 with partial patellectomy   Discharged Condition: Improved  Hospital Course: [name] is an 81 y.o. female who was admitted 02/03/2015 for operative treatment ofClosed displaced transverse fracture of right patella. Patient has severe unremitting pain that affects sleep, daily activities, and work/hobbies. After pre-op clearance the patient was taken to the operating room on 02/03/2015 and underwent  Procedure(s): OPEN REDUCTION INTERNAL (ORIF) FIXATION RIGHT PATELLA.  With partial patellectomy on the right side.  Patient was given perioperative antibiotics: Anti-infectives    Start     Dose/Rate Route Frequency Ordered Stop   02/04/15 0200  ceFAZolin (ANCEF) IVPB 1 g/50 mL premix     1 g 100 mL/hr over 30 Minutes Intravenous Every 12 hours 02/03/15 1807 02/04/15 1740   02/03/15 1400  ceFAZolin (ANCEF) IVPB 2 g/50 mL premix     2 g 100 mL/hr over 30 Minutes Intravenous To [facility]Stay Surgical 02/02/15 1341 02/03/15 1431       Patient was given sequential compression devices, early ambulation, and chemoprophylaxis to prevent DVT.  Patient benefited maximally from hospital stay and there were no complications.    Recent vital signs: Patient Vitals for the past 24 hrs:  BP Temp Temp src Pulse Resp SpO2  02/06/15  0402 (!) 164/56 mmHg 98.1 F (36.7 C) Oral 79 16 96 %  02/05/15 1925 (!) 171/67 mmHg 98.1 F (36.7 C) Oral 85 17 98 %  02/05/15 1402 (!) 144/58 mmHg 97.6 F (36.4 C) - 68 16 98 %     Recent laboratory studies:  Recent Labs  02/03/15 1251  WBC 7.3  HGB 13.1  HCT 40.0  PLT 231  NA 136  K 4.3  CL 101  CO2 25  BUN 20  CREATININE 0.73  GLUCOSE 92  INR 1.07  CALCIUM 8.8*     Discharge Medications:     Medication List    TAKE these medications        acetaminophen 325 MG tablet  Commonly known as:  TYLENOL  Take 650 mg by mouth every 4 (four) hours as needed for mild pain.     aspirin EC 81 MG tablet  Take 81 mg by mouth daily.     I-VITE PO  Take 1 tablet by mouth daily.     loperamide 2 MG capsule  Commonly known as:  IMODIUM  Take 2 mg by mouth daily.     nitrofurantoin 100 MG capsule  Commonly known as:  MACRODANTIN  Take 100 mg by mouth 2 (two) times daily. For 7 days     OYSTER SHELL CALCIUM + D 500-200 MG-UNIT Tabs  Generic drug:  Calcium Carb-Cholecalciferol  Take 1 tablet by mouth daily.     saccharomyces boulardii 250 MG capsule  Commonly known as:  FLORASTOR  Take 250 mg by mouth 2 (  two) times daily. For 7 days     sodium chloride 5 % ophthalmic ointment  Commonly known as:  MURO 297  Place 1 application into the left eye daily.     SYSTANE ULTRA 0.4-0.3 % Soln  Generic drug:  Polyethyl Glycol-Propyl Glycol  Place 1 drop into both eyes 3 (three) times daily.     SYSTANE 0.4-0.3 % Gel ophthalmic gel  Generic drug:  Polyethyl Glycol-Propyl Glycol  Place 1 application into both eyes at bedtime.     traMADol 50 MG tablet  Commonly known as:  ULTRAM  Take 1 tablet (50 mg total) by mouth every 6 (six) hours as needed for moderate pain.     traMADol 50 MG tablet  Commonly known as:  ULTRAM  Take 1 tablet (50 mg total) by mouth every 6 (six) hours as needed for moderate pain.     vitamin B-12 1000 MCG tablet  Commonly known as:   CYANOCOBALAMIN  Take 1,000 mcg by mouth daily.     vitamin C 1000 MG tablet  Take 1,000 mg by mouth daily.        Diagnostic Studies: Dg Chest 2 View  02/03/2015  CLINICAL DATA:  Preop patellar fracture. EXAM: CHEST  2 VIEW COMPARISON:  None. FINDINGS: Heart is normal size. Calcifications throughout the aorta. No visible aneurysm. No confluent airspace opacities or effusions. No acute bony abnormality. Degenerative changes in the thoracic spine. IMPRESSION: No active cardiopulmonary disease. Electronically Signed   By: Rolm Baptise M.D.   On: 02/03/2015 13:28   Dg C-arm 1-60 Min-no Report  02/03/2015  CLINICAL DATA: surgery C-ARM 1-60 MINUTES Fluoroscopy was utilized by the requesting physician.  No radiographic interpretation.    Disposition: [place]      Discharge Instructions    Call MD / Call 911    Complete by:  As directed   If you experience chest pain or shortness of breath, CALL 911 and be transported to the hospital emergency room.  If you develope a fever above 101 F, pus (white drainage) or increased drainage or redness at the wound, or calf pain, call your surgeon's office.     Constipation Prevention    Complete by:  As directed   Drink plenty of fluids.  Prune juice may be helpful.  You may use a stool softener, such as Colace (over the counter) 100 mg twice a day.  Use MiraLax (over the counter) for constipation as needed.     Diet general    Complete by:  As directed      Increase activity slowly as tolerated    Complete by:  As directed      Weight bearing as tolerated    Complete by:  As directed   She will need a right knee dressing change in 4 days. A new large sheet of Xeroform should be applied to her skin which is very friable. A light 4 x 4 dressing should be applied with 1 ABD dressing over her incision. Her incision should be kept dry. An Ace wrap should be reapplied around her knee.  Laterality:  right  Extremity:   Lower  With knee immobilizer when up. She should also wear knee immobilizer at all times except when working with physical therapy.          she will need daily physical therapy for walker ambulation weightbearing as tolerated on the right with a knee immobilizer. Physical therapy can do  range of motion exercises going from 0 to 30. She can also do straight leg raises as tolerated.  Follow-up Information    Follow up with GRAVES,JOHN L, MD. Schedule an appointment as soon as possible for a visit in 2 weeks.   Specialty:  Orthopedic Surgery   Contact information:   [place] [address] [phone]        Signed: Erlene Senters 02/06/2015, 8:25 AM

## 2015-02-06 NOTE — Progress Notes (Signed)
Pt discharged to [facility], report called and given to Chris--RN. Zola Button, RN

## 2015-02-06 NOTE — Progress Notes (Signed)
Physical Therapy Treatment Patient Details Name: [name] MRN: [id number] DOB: Mar 19, 1923 Today's Date: 02/06/2015    History of Present Illness Pt is a 81 y.o. female s/p ORIF R patella 02-03-15.    PT Comments    Patient is making gradual progress with PT.  From a mobility standpoint recommending patient be DC to SNF for further rehabilitation. Patient in agreement.      Follow Up Recommendations  Supervision/Assistance - 24 hour;SNF     Equipment Recommendations  None recommended by PT    Recommendations for Other Services       Precautions / Restrictions Precautions Precautions: Fall Required Braces or Orthoses: Knee Immobilizer - Right Knee Immobilizer - Right: On at all times Restrictions Weight Bearing Restrictions: Yes RLE Weight Bearing: Weight bearing as tolerated    Mobility  Bed Mobility               General bed mobility comments: up in chair upon arrival  Transfers Overall transfer level: Needs assistance Equipment used: Rolling walker (2 wheeled) Transfers: Sit to/from Stand Sit to Stand: Min assist         General transfer comment: cues for hand placement  Ambulation/Gait Ambulation/Gait assistance: Min guard Ambulation Distance (Feet): 175 Feet Assistive device: Rolling walker (2 wheeled) Gait Pattern/deviations: Step-through pattern;Decreased weight shift to right Gait velocity: decreased   General Gait Details: cues for posture and staying inside walker.   Stairs            Wheelchair Mobility    Modified Rankin (Stroke Patients Only)       Balance Overall balance assessment: Needs assistance Sitting-balance support: No upper extremity supported Sitting balance-Leahy Scale: Fair     Standing balance support: Bilateral upper extremity supported Standing balance-Leahy Scale: Poor Standing balance comment: using rw                    Cognition Arousal/Alertness: Awake/alert Behavior During  Therapy: WFL for tasks assessed/performed Overall Cognitive Status: Within Functional Limits for tasks assessed                      Exercises General Exercises - Lower Extremity Quad Sets: Strengthening;Right;10 reps Heel Slides: AAROM;Right;10 reps;Other (comment) (max 30 degree flexion) Straight Leg Raises: Strengthening;Right;10 reps;Other (comment) (min assist)    General Comments        Pertinent Vitals/Pain Pain Assessment: No/denies pain Pain Intervention(s): Monitored during session    Home Living                      Prior Function            PT Goals (current goals can now be found in the care plan section) Acute Rehab PT Goals Patient Stated Goal: get back to being herself PT Goal Formulation: With patient Time For Goal Achievement: 02/18/15 Potential to Achieve Goals: Good Progress towards PT goals: Progressing toward goals    Frequency  Min 3X/week    PT Plan Current plan remains appropriate    Co-evaluation             End of Session Equipment Utilized During Treatment: Gait belt;Right knee immobilizer Activity Tolerance: Patient tolerated treatment well Patient left: in chair;with call bell/phone within reach     Time: 0857-0918 PT Time Calculation (min) (ACUTE ONLY): 21 min  Charges:  $Gait Training: 8-22 mins  G Codes:      Cassell Clement, PT, CSCS Pager [phone] Office [phone]  02/06/2015, 11:19 AM

## 2015-02-08 ENCOUNTER — Encounter: Payer: Self-pay | Admitting: Nurse Practitioner

## 2015-02-08 ENCOUNTER — Non-Acute Institutional Stay (SKILLED_NURSING_FACILITY): Payer: Medicare Other | Admitting: Nurse Practitioner

## 2015-02-08 DIAGNOSIS — K219 Gastro-esophageal reflux disease without esophagitis: Secondary | ICD-10-CM | POA: Diagnosis not present

## 2015-02-08 DIAGNOSIS — M25561 Pain in right knee: Secondary | ICD-10-CM | POA: Diagnosis not present

## 2015-02-08 DIAGNOSIS — S82001S Unspecified fracture of right patella, sequela: Secondary | ICD-10-CM

## 2015-02-08 DIAGNOSIS — R197 Diarrhea, unspecified: Secondary | ICD-10-CM | POA: Diagnosis not present

## 2015-02-08 DIAGNOSIS — G4700 Insomnia, unspecified: Secondary | ICD-10-CM | POA: Diagnosis not present

## 2015-02-08 NOTE — Assessment & Plan Note (Signed)
Stated is she can take Tramadol at night which will help her sleeping better.

## 2015-02-08 NOTE — Assessment & Plan Note (Signed)
Asymptomatic. 

## 2015-02-08 NOTE — Assessment & Plan Note (Signed)
01/14/15 x-ray transverse fracture identified through through the right patella.  02/03/15 s/p ORIF of the right transverse fracture of right patella.  Will schedule Tramadol 50mg  nightly, dc Norco kept her awake, continue PT/OT, ambulates with walker as tolerated.

## 2015-02-08 NOTE — Assessment & Plan Note (Signed)
S/p ORIF ORIF right transverse patella fx, pain is minimal, ambulates with walker, will schedule Tramadol 50mg  nightly to help rest and pain

## 2015-02-08 NOTE — Progress Notes (Signed)
Patient ID: [name], female   DOB: December 21, 1922, 81 y.o.   MRN: [id number]  Location: [facility] Provider:  Marlana Latus NP  Code Status:  DNR Goals of care: Advanced Directive information    Chief Complaint  Patient presents with  . Medical Management of Chronic Issues  . Acute Visit    right knee pain.      HPI: Patient is a 81 y.o. female seen in the SNF at [facility] today for evaluation of s/p ORIF of the right patella 02/03/15 for displaced transverse fracture of right patella. The patient is doing well, ambulates with walker, pain is minimal, she requested to have Tramadol 50mg  qhs, said Norco last night kept her awake.   Review of Systems  Constitutional: Negative.   HENT: Positive for hearing loss. Negative for sore throat.   Eyes: Negative.   Cardiovascular: Negative for chest pain, palpitations and leg swelling.  Gastrointestinal: Negative for diarrhea.  Genitourinary:       Incontinent of urine.  Musculoskeletal: Positive for joint pain.       Left knee pain. Hx of tibial plateau fracture. Osteoarthritis of the fingers. R knee in immobilizer, tender with weight bearing, mild edema right ankle and foot.   Skin: Negative.   Neurological: Negative for dizziness, tremors, speech change, seizures, weakness and headaches.  Psychiatric/Behavioral: The patient is not nervous/anxious.     Past Medical History  Diagnosis Date  . Arthritis   . Cancer (HCC)     squamous cell  . Incontinence of urine   . Allergy   . Oxygen deficiency   . GERD (gastroesophageal reflux disease)   . Squamous cell carcinoma of skin   . Female stress incontinence   . Frequent loose stools   . TIA (transient ischemic attack) 01/05/13    Patient Active Problem List   Diagnosis Date Noted  . Right knee pain 02/08/2015  . Closed fracture of right patella 01/16/2015  . Sepsis due to Escherichia coli ([place]) 01/04/2015  . Bacteremia 01/02/2015  . UTI (lower urinary tract  infection) 01/02/2015  . Hyponatremia 01/02/2015  . Carotid atherosclerosis 06/09/2014  . Hyperglycemia 06/09/2014  . Multiple pulmonary nodules 04/01/2014  . Hearing loss 01/20/2014  . Insomnia 02/08/2013  . Left shoulder pain 02/08/2013  . History of amputation of lesser toe of right foot ([place]) 01/20/2013  . TIA (transient ischemic attack) 01/13/2013  . Tibial plateau fracture 01/13/2013  . Frequent loose stools 07/01/2012  . GERD (gastroesophageal reflux disease) 03/21/2011  . Squamous cell skin cancer 03/21/2011  . Urine, incontinence, stress female 03/21/2011  . Osteoarthritis, hand 03/21/2011    Allergies  Allergen Reactions  . Penicillin G Itching    Has patient had a PCN reaction causing immediate rash, facial/tongue/throat swelling, SOB or lightheadedness with hypotension: No Has patient had a PCN reaction causing severe rash involving mucus membranes or skin necrosis: No Has patient had a PCN reaction that required hospitalization No Has patient had a PCN reaction occurring within the last 10 years: No If all of the above answers are "NO", then may proceed with Cephalosporin use.  Sarina Ill [Bactrim]     Stomach concerns.     Medications: Patient's Medications  New Prescriptions   No medications on file  Previous Medications   ACETAMINOPHEN (TYLENOL) 500 MG TABLET    Take 500 mg by mouth every 6 (six) hours as needed for mild pain, moderate pain or headache.   ASCORBIC ACID (VITAMIN C) 1000 MG  TABLET    Take 1,000 mg by mouth daily.   ASPIRIN EC 81 MG TABLET    Take 81 mg by mouth daily.    B COMPLEX VITAMINS TABLET    Take 1 tablet by mouth daily.   BETA CAROTENE W/MINERALS (OCUVITE) TABLET    Take 1 tablet by mouth daily.   CALCIUM CARBONATE-VITAMIN D (CALCIUM 600 + D PO)    Take 1 tablet by mouth daily.   CIPROFLOXACIN (CIPRO) 500 MG TABLET    Take 1 tablet (500 mg total) by mouth 2 (two) times daily.   DIPHENHYDRAMINE (SOMINEX) 25 MG TABLET    Take 25 mg by  mouth every 4 (four) hours as needed for allergies.   HYDROCODONE-ACETAMINOPHEN (NORCO/VICODIN) 5-325 MG PER TABLET    Take 1 tablet by mouth every 4 (four) hours as needed.   LOPERAMIDE (IMODIUM) 2 MG CAPSULE    Take 2 mg by mouth as needed for diarrhea or loose stools.   POLYETHYL GLYCOL-PROPYL GLYCOL (SYSTANE OP)    Apply 1 drop to eye 2 (two) times daily.  Modified Medications   No medications on file  Discontinued Medications   No medications on file    Physical Exam: Filed Vitals:   02/08/15 1550  BP: 140/70  Pulse: 86  Temp: 98.3 F (36.8 C)  TempSrc: Tympanic  Resp: 20   There is no weight on file to calculate BMI.  Physical Exam  Constitutional: She is oriented to person, place, and time. She appears well-developed and well-nourished. No distress.  HENT:  Head: Normocephalic and atraumatic.  Right Ear: External ear normal.  Left Ear: External ear normal.  Nose: Nose normal.  Mouth/Throat: Oropharynx is clear and moist. No oropharyngeal exudate.  Bilateral hearing aids. Profound loss of hearing.  Eyes: Conjunctivae and EOM are normal. Pupils are equal, round, and reactive to light. Right eye exhibits no discharge. Left eye exhibits no discharge. No scleral icterus.  Neck: Normal range of motion. Neck supple. No JVD present. No tracheal deviation present. No thyromegaly present.  Cardiovascular: Normal rate, regular rhythm, normal heart sounds and intact distal pulses.   No murmur heard. Pulmonary/Chest: Effort normal and breath sounds normal. No respiratory distress. She has no wheezes. She has no rales.  Abdominal: Soft. Bowel sounds are normal. She exhibits no distension. There is no tenderness.  Musculoskeletal: She exhibits edema and tenderness.  Left knee pain.  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 Right knee in immobilizer, mild edema right ankle and foot.   Neurological: She is alert and oriented to person, place, and time. She  displays normal reflexes. No cranial nerve deficit. She exhibits normal muscle tone. Coordination normal.  Skin: No rash noted. She is not diaphoretic. There is erythema. No pallor.  Brownish pigmented BLE  Psychiatric: She has a normal mood and affect. Her behavior is normal. Judgment and thought content normal.  Talks constantly and changes her subjects frequently.     Labs reviewed: Basic Metabolic Panel:  Recent Labs  01/02/15 1547 01/03/15 0604 01/04/15 0555  NA 129* 134* 135  K 4.3 4.1 3.8  CL 95* 103 105  CO2 24 24 23   GLUCOSE 113* 90 86  BUN 27* 22* 16  CREATININE 0.88 0.92 0.73  CALCIUM 8.7* 8.0* 7.7*    Liver Function Tests:  Recent Labs  01/01/15 1735 01/02/15 1547 01/04/15 0555  AST 39 35 32  ALT 28 28 36  ALKPHOS 85 80 79  BILITOT 0.7  0.6 0.5  PROT 6.3* 6.2* 4.7*  ALBUMIN 3.3* 3.3* 2.2*    CBC:  Recent Labs  01/01/15 1554 01/01/15 1627 01/02/15 1547 01/04/15 0555  WBC 14.4*  --  14.7* 9.3  NEUTROABS 11.8*  --  12.4*  --   HGB 13.5 14.6 13.0 11.5*  HCT 39.3 43.0 36.4 34.1*  MCV 89.9  --  88.6 90.7  PLT 172  --  186 173    Lab Results  Component Value Date   TSH 1.844 01/13/2013   Lab Results  Component Value Date   HGBA1C 6.0 06/14/2014   Lab Results  Component Value Date   CHOL 174 06/14/2014   HDL 96* 06/14/2014   LDLCALC 64 06/14/2014   TRIG 69 06/14/2014   CHOLHDL 1.4 01/13/2013    Significant Diagnostic Results since last visit: none  Patient Care Team: Estill Dooms, MD as PCP - General (Internal Medicine) Dorna Leitz, MD as Consulting Physician (Orthopedic Surgery) Carol Ada, MD as Consulting Physician (Gastroenterology) Crista Luria, MD as Consulting Physician (Dermatology) [facility] as Audiologist (Audiology) Jeromie Gainor X, NP as Nurse Practitioner (Nurse Practitioner)  Assessment/Plan Problem List Items Addressed This Visit    GERD (gastroesophageal reflux disease)    Asymptomatic        Frequent loose stools    Continue Loperamide 2mg  daily.        Insomnia    Stated is she can take Tramadol at night which will help her sleeping better.       Closed fracture of right patella - Primary    01/14/15 x-ray transverse fracture identified through through the right patella.  02/03/15 s/p ORIF of the right transverse fracture of right patella.  Will schedule Tramadol 50mg  nightly, dc Norco kept her awake, continue PT/OT, ambulates with walker as tolerated.       Right knee pain    S/p ORIF ORIF right transverse patella fx, pain is minimal, ambulates with walker, will schedule Tramadol 50mg  nightly to help rest and pain          Family/ staff Communication: observe the patient.   Labs/tests ordered: none  ManXie Audel Coakley NP [facility] [address], [place] [address] On Call:  [phone] & follow prompts after 5pm & weekends Office Phone:  [phone] Office Fax:  [phone]

## 2015-02-08 NOTE — Assessment & Plan Note (Signed)
Continue Loperamide 2mg  daily.

## 2015-02-09 ENCOUNTER — Encounter: Payer: Self-pay | Admitting: Nurse Practitioner

## 2015-02-09 ENCOUNTER — Encounter: Payer: Self-pay | Admitting: Internal Medicine

## 2015-02-09 ENCOUNTER — Non-Acute Institutional Stay (SKILLED_NURSING_FACILITY): Payer: Medicare Other | Admitting: Internal Medicine

## 2015-02-09 DIAGNOSIS — Z9181 History of falling: Secondary | ICD-10-CM | POA: Diagnosis not present

## 2015-02-09 DIAGNOSIS — W19XXXA Unspecified fall, initial encounter: Secondary | ICD-10-CM | POA: Insufficient documentation

## 2015-02-09 DIAGNOSIS — N390 Urinary tract infection, site not specified: Secondary | ICD-10-CM

## 2015-02-09 DIAGNOSIS — S82001S Unspecified fracture of right patella, sequela: Secondary | ICD-10-CM | POA: Diagnosis not present

## 2015-02-09 DIAGNOSIS — M25561 Pain in right knee: Secondary | ICD-10-CM

## 2015-02-09 DIAGNOSIS — R918 Other nonspecific abnormal finding of lung field: Secondary | ICD-10-CM | POA: Diagnosis not present

## 2015-02-09 DIAGNOSIS — K219 Gastro-esophageal reflux disease without esophagitis: Secondary | ICD-10-CM

## 2015-02-09 MED ORDER — TRAMADOL HCL 50 MG PO TABS: ORAL_TABLET | ORAL | Status: DC

## 2015-02-09 NOTE — Progress Notes (Signed)
Patient ID: [name], female   DOB: [birth year], 81 y.o.   MRN: [id number]    HISTORY AND PHYSICAL  Location:  [facility] Room Number: 18 Place of Service: SNF (31)   Extended Emergency Contact Information Primary Emergency Contact: Dameron,Julie  [place] Phone: [phone] Relation: Friend  Advanced Directive information Does patient have an advance directive?: Yes, Type of Advance Directive: Living will;Out of facility DNR (pink MOST or yellow form);Healthcare Power of Attorney, Pre-existing out of facility DNR order (yellow form or pink MOST form): Yellow form placed in chart (order not valid for inpatient use), Does patient want to make changes to advanced directive?: No - Patient declined  Chief Complaint  Patient presents with  . New Admit To SNF    Following hospitalization    HPI:  Readmission to skilled nursing facility for a new problem of a closed displaced transverse fracture of the right patella who underwent ORIF on 02/03/2015 by Dr. Berenice Primas. She continues to have moderate pain. She complains of not being able to sleep because of the discomfort. Patient has had several changes and pain medications between the hospital and her initial days at our SNF at [facility]. Patient did not want to take narcotics because she had a previous experience that caused her to be very drowsy and "out of it". Nevertheless she's had at least one dose of hydrocodone/APAP 5/325 that did not affect her in this way and did relieve the pain. Because of her desire not to take narcotics, tramadol was substituted, but it was not available last night when she needed it.  Patient has a history of falls. She has unstable in her feet and needs to use a walker.  She has generalized osteoarthritis that affects her hands and the left knee. Left knee has had a previous tibial plateau fracture in 2014.  Patient has GERD and frequent diarrhea,  but these seem under control at this time. She has seen Dr. Benson Norway, gastroenterologist, about these problems in the past.  Insomnia is a chronic complaint.  She is hypertensive, but it is controlled.  She has multiple lung nodules and is being followed by Dr. Legrand Como work. She had a previous x-ray that showed possible enlargement of one of the nodules and needs to see Dr. Melvyn Novas again.  9/11/6 CXR: IMPRESSION: Probable new enlarged nodule seen in the left perihilar region; CT scan of the chest with contrast administration is recommended for further evaluation  Patient was previous smoker who quit 39 years ago.  Past Medical History  Diagnosis Date  . Arthritis   . Cancer (HCC)     squamous cell  . Incontinence of urine   . Allergy   . Oxygen deficiency   . GERD (gastroesophageal reflux disease)   . Squamous cell carcinoma of skin   . Female stress incontinence   . Frequent loose stools   . TIA (transient ischemic attack) 01/05/13    Past Surgical History  Procedure Laterality Date  . Breast surgery  1982    breast  . Tonsillectomy  1940  . Abdominal hysterectomy  1959  . Tee without cardioversion N/A 01/18/2013    Procedure: TRANSESOPHAGEAL ECHOCARDIOGRAM (TEE);  Surgeon: Lelon Perla, MD;  Location: [facility];  Service: Cardiovascular;  Laterality: N/A;  . Appendectomy  1959  . Colonoscopy  2013    Dr. Carol Ada    Patient Care Team: Estill Dooms, MD as PCP - General (  Internal Medicine) Dorna Leitz, MD as Consulting Physician (Orthopedic Surgery) Carol Ada, MD as Consulting Physician (Gastroenterology) Crista Luria, MD as Consulting Physician (Dermatology) [facility] as Audiologist (Audiology) Man Mast Rhunette Croft, NP as Nurse Practitioner (Nurse Practitioner) Tanda Rockers, MD as Consulting Physician (Pulmonary Disease)  Social History   Social History  . Marital Status: Widowed    Spouse Name: N/A  . Number of Children: N/A  . Years  of Education: N/A   Occupational History  . Not on file.   Social History Main Topics  . Smoking status: Former Smoker -- 1.00 packs/day for 15 years    Types: Cigarettes    Quit date: 03/20/1977  . Smokeless tobacco: Never Used  . Alcohol Use: No  . Drug Use: No  . Sexual Activity: No   Other Topics Concern  . Not on file   Social History Narrative   Lives at [facility] for 30 years stopped 1978   Alcohol none   Exercise 3 days a week   Living Will    reports that she quit smoking about 37 years ago. Her smoking use included Cigarettes. She has a 15 pack-year smoking history. She has never used smokeless tobacco. She reports that she does not drink alcohol or use illicit drugs.  Family History  Problem Relation Age of Onset  . Heart disease Mother     failure  . Cancer Sister     breast  . Cancer Father   . Cancer Son     lung   Family Status  Relation Status Death Age  . Mother Deceased 61  . Sister Deceased 17  . Father Deceased 59  . Son Deceased 73    Immunization History  Administered Date(s) Administered  . Influenza Split 02/18/2011, 01/03/2012, 01/20/2014  . Influenza,inj,Quad PF,36+ Mos 12/30/2012  . Pneumococcal Conjugate-13 12/26/2009  . Tdap 04/27/2006, 01/15/2015    Allergies  Allergen Reactions  . Penicillin G Itching    Has patient had a PCN reaction causing immediate rash, facial/tongue/throat swelling, SOB or lightheadedness with hypotension: No Has patient had a PCN reaction causing severe rash involving mucus membranes or skin necrosis: No Has patient had a PCN reaction that required hospitalization No Has patient had a PCN reaction occurring within the last 10 years: No If all of the above answers are "NO", then may proceed with Cephalosporin use.  Sarina Ill [Bactrim]     Stomach concerns.     Medications: Patient's Medications  New Prescriptions   TRAMADOL (ULTRAM) 50 MG TABLET    Take one tablet  every 6 hours if needed for pain  Previous Medications   ACETAMINOPHEN (TYLENOL) 500 MG TABLET    Take 500 mg by mouth every 6 (six) hours as needed for mild pain, moderate pain or headache.   ASCORBIC ACID (VITAMIN C) 1000 MG TABLET    Take 1,000 mg by mouth daily.   ASPIRIN EC 81 MG TABLET    Take 81 mg by mouth daily.    B COMPLEX VITAMINS TABLET    Take 1 tablet by mouth daily.   BETA CAROTENE W/MINERALS (OCUVITE) TABLET    Take 1 tablet by mouth daily.   CALCIUM CARBONATE-VITAMIN D (CALCIUM 600 + D PO)    Take 1 tablet by mouth daily.   CIPROFLOXACIN (CIPRO) 500 MG TABLET    Take 1 tablet (500 mg total) by mouth 2 (two) times daily.  DIPHENHYDRAMINE (SOMINEX) 25 MG TABLET    Take 25 mg by mouth every 4 (four) hours as needed for allergies.   HYDROCODONE-ACETAMINOPHEN (NORCO/VICODIN) 5-325 MG PER TABLET    Take 1 tablet by mouth every 4 (four) hours as needed.   LOPERAMIDE (IMODIUM) 2 MG CAPSULE    Take 2 mg by mouth as needed for diarrhea or loose stools.   POLYETHYL GLYCOL-PROPYL GLYCOL (SYSTANE OP)    Apply 1 drop to eye 2 (two) times daily.  Modified Medications   No medications on file  Discontinued Medications   No medications on file    Review of Systems  Constitutional: Negative.  Negative for fever, chills, diaphoresis, activity change, appetite change, fatigue and unexpected weight change.  HENT: Positive for hearing loss and voice change (hoarse). Negative for congestion, ear discharge, ear pain, postnasal drip, rhinorrhea, sore throat, tinnitus and trouble swallowing.   Eyes: Negative.  Negative for pain, redness, itching and visual disturbance.  Respiratory: Negative for cough, choking, chest tightness, shortness of breath and wheezing.   Cardiovascular: Negative for chest pain, palpitations and leg swelling.  Gastrointestinal: Positive for diarrhea. Negative for nausea, abdominal pain, constipation and abdominal distention.  Endocrine: Negative.  Negative for cold  intolerance, heat intolerance, polydipsia, polyphagia and polyuria.  Genitourinary: Negative for dysuria, urgency, frequency, hematuria, flank pain, vaginal discharge, difficulty urinating and pelvic pain.       Incontinent of urine.  Musculoskeletal: Negative for myalgias, back pain, arthralgias, gait problem, neck pain and neck stiffness.       02/03/2015 ORIF of a closed displaced transverse fracture of right patella. Right leg splint. Left knee pain. Hx of tibial plateau fracture. Osteoarthritis of the fingers.  Skin: Negative.  Negative for color change, pallor and rash.  Allergic/Immunologic: Negative.   Neurological: Negative for dizziness, tremors, seizures, syncope, weakness, numbness and headaches.  Hematological: Negative.  Negative for adenopathy. Does not bruise/bleed easily.  Psychiatric/Behavioral: Negative for suicidal ideas, hallucinations, behavioral problems, confusion, sleep disturbance, dysphoric mood and agitation. The patient is not nervous/anxious and is not hyperactive.     Filed Vitals:   02/09/15 1318  BP: 136/70  Pulse: 74  Temp: 97.2 F (36.2 C)  Resp: 18  Height: 5' 1"  (1.549 m)  Weight: 99 lb (44.906 kg)   Body mass index is 18.72 kg/(m^2).  Physical Exam  Constitutional: She is oriented to person, place, and time. She appears well-developed and well-nourished. No distress.  HENT:  Head: Normocephalic and atraumatic.  Right Ear: External ear normal.  Left Ear: External ear normal.  Nose: Nose normal.  Mouth/Throat: Oropharynx is clear and moist. No oropharyngeal exudate.  Bilateral hearing aids. Profound loss of hearing.  Eyes: Conjunctivae and EOM are normal. Pupils are equal, round, and reactive to light. Right eye exhibits no discharge. Left eye exhibits no discharge. No scleral icterus.  Neck: Normal range of motion. Neck supple. No JVD present. No tracheal deviation present. No thyromegaly present.  Cardiovascular: Normal rate, regular rhythm,  normal heart sounds and intact distal pulses.   No murmur heard. Pulmonary/Chest: Effort normal and breath sounds normal. No respiratory distress. She has no wheezes. She has no rales.  Abdominal: Soft. Bowel sounds are normal. She exhibits no distension. There is no tenderness.  Musculoskeletal: She exhibits edema and tenderness.  Left knee pain.  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 Right knee in immobilizer, mild edema right ankle and foot.   Neurological: She is alert and oriented to person,  place, and time. She displays normal reflexes. No cranial nerve deficit. She exhibits normal muscle tone. Coordination normal.  Skin: No rash noted. She is not diaphoretic. There is erythema. No pallor.  Brownish pigmented BLE  Psychiatric: She has a normal mood and affect. Her behavior is normal. Judgment and thought content normal.  Talks constantly and changes her subjects frequently.     Labs reviewed: Lab Summary Latest Ref Rng 01/04/2015 01/03/2015 01/02/2015  Hemoglobin 12.0 - 15.0 g/dL 11.5(L) (None) 13.0  Hematocrit 36.0 - 46.0 % 34.1(L) (None) 36.4  White count 4.0 - 10.5 K/uL 9.3 (None) 14.7(H)  Platelet count 150 - 400 K/uL 173 (None) 186  Sodium 135 - 145 mmol/L 135 134(L) 129(L)  Potassium 3.5 - 5.1 mmol/L 3.8 4.1 4.3  Calcium 8.9 - 10.3 mg/dL 7.7(L) 8.0(L) 8.7(L)  Phosphorus - (None) (None) (None)  Creatinine 0.44 - 1.00 mg/dL 0.73 0.92 0.88  AST 15 - 41 U/L 32 (None) 35  Alk Phos 38 - 126 U/L 79 (None) 80  Bilirubin 0.3 - 1.2 mg/dL 0.5 (None) 0.6  Glucose 65 - 99 mg/dL 86 90 113(H)  Cholesterol - (None) (None) (None)  HDL cholesterol - (None) (None) (None)  Triglycerides - (None) (None) (None)  LDL Direct - (None) (None) (None)  LDL Calc - (None) (None) (None)  Total protein 6.5 - 8.1 g/dL 4.7(L) (None) 6.2(L)  Albumin 3.5 - 5.0 g/dL 2.2(L) (None) 3.3(L)   Lab Results  Component Value Date   BUN 16 01/04/2015   Lab Results  Component Value  Date   HGBA1C 6.0 06/14/2014   Lab Results  Component Value Date   TSH 1.844 01/13/2013    Assessment/Plan  1. Closed fracture of right patella, sequela Engaged in physical therapy Continue with tramadol 50 mg every 6 hours as needed for pain. Added order for Vicodin 5/325 to be used one at night if there is no pain relief from the tramadol.  2. History of fall Continue to use walker.  3. Multiple pulmonary nodules Follow-up with Dr. Melvyn Novas for possible new nodule in the left lung  4. Recurrent UTI Patient was hospitalized with urosepsis in September 2016. She is asymptomatic at present time.  5. Gastroesophageal reflux disease without esophagitis Asymptomatic.  6. Right knee pain Continue pain medications as noted above.

## 2015-02-16 ENCOUNTER — Encounter: Payer: Medicare Other | Admitting: Internal Medicine

## 2015-02-16 DIAGNOSIS — Z9889 Other specified postprocedural states: Secondary | ICD-10-CM | POA: Diagnosis not present

## 2015-02-16 DIAGNOSIS — M25561 Pain in right knee: Secondary | ICD-10-CM | POA: Diagnosis not present

## 2015-02-16 DIAGNOSIS — S82031D Displaced transverse fracture of right patella, subsequent encounter for closed fracture with routine healing: Secondary | ICD-10-CM | POA: Diagnosis not present

## 2015-02-20 ENCOUNTER — Encounter: Payer: Self-pay | Admitting: Internal Medicine

## 2015-02-20 ENCOUNTER — Ambulatory Visit (INDEPENDENT_AMBULATORY_CARE_PROVIDER_SITE_OTHER): Payer: Medicare Other | Admitting: Internal Medicine

## 2015-02-20 VITALS — BP 102/60 | HR 53

## 2015-02-20 DIAGNOSIS — R918 Other nonspecific abnormal finding of lung field: Secondary | ICD-10-CM

## 2015-02-20 NOTE — Progress Notes (Signed)
Subjective:    Patient ID: [name], female    DOB: [birth year]    MRN: [id number]    Brief patient profile:  34  yowf quit smoking 1978 with tendency to allergy problems all her life consistenting of lots of nasal congestion down into chest and resolves over a few weeks s rx then onset  Nov 2015 with fever 101 > [facility]>  urgent care cxr > ct > mpn's so referred to pulmonary clinic  03/30/2014    History of Present Illness  03/30/2014 1st [facility] office visit/ [facility]   Chief Complaint  Patient presents with  . Pulmonary Consult    Referred by Dr. Arlyss Queen for eval of pulmonary nodules. Pt states had "allergy attack" a couple wks ago- had alot of cough and fever at that time and so cxr was done and then ct chest.   Back to baseline now and very sedendentary but Not limited by breathing from desired activities   rec No change rx/ f/u cxr on return   07/05/2014 f/u ov/[facility] re: mpns Chief Complaint  Patient presents with  . Follow-up    3 month follow up. States feeling well during visit. Denies SOB, wheezing, chest pain or tightness, or cough    rec Please remember to go to the   x-ray department downstairs for your tests - we will call you with the results when they are available  02/20/2015  f/u ov/[facility] re: asym mpns Chief Complaint  Patient presents with  . Follow-up    Pt states her breathing is unchanged. No new co's today.    Not limited by breathing from desired activities  / very sedentary    No   chronic cough or cp or chest tightness, subjective wheeze overt sinus or hb symptoms. No unusual exp hx or h/o childhood pna/ asthma or knowledge of premature birth.  Sleeping ok without nocturnal  or early am exacerbation  of respiratory  c/o's or need for noct saba. Also denies any obvious fluctuation of symptoms with weather or environmental changes or other aggravating or alleviating factors except as outlined above   Current Medications,  Allergies, Complete Past Medical History, Past Surgical History, Family History, and Social History were reviewed in [employer] record.  ROS  The following are not active complaints unless bolded sore throat, dysphagia, dental problems, itching, sneezing,  nasal congestion or excess/ purulent secretions, ear ache,   fever, chills, sweats, unintended wt loss, pleuritic or exertional cp, hemoptysis,  orthopnea pnd or leg swelling, presyncope, palpitations, heartburn, abdominal pain, anorexia, nausea, vomiting, diarrhea  or change in bowel or urinary habits, change in stools or urine, dysuria,hematuria,  rash, arthralgias, visual complaints, headache, numbness weakness or ataxia or problems with walking or coordination,  change in mood/affect or memory.                    Objective:   Physical Exam  amb hoarse wf     02/20/2015      Wt Readings from Last 3 Encounters:  07/05/14 99 lb (44.906 kg)  03/30/14 100 lb (45.36 kg)  03/24/14 98 lb (44.453 kg)    Vital signs reviewed   HEENT: nl dentition, turbinates, and orophanx. Nl external ear canals without cough reflex   NECK :  without JVD/Nodes/TM/ nl carotid upstrokes bilaterally   LUNGS: no acc muscle use, clear to A and P bilaterally without cough on insp or exp maneuvers  CV:  RRR  no s3 or murmur or increase in P2, no edema   ABD:  soft and nontender with nl excursion in the supine position. No bruits or organomegaly, bowel sounds nl  MS:  warm without deformities, calf tenderness, cyanosis or clubbing  SKIN: warm and dry without lesions    NEURO:  alert, approp, no deficits       I personally reviewed images and agree with radiology impression as follows:  CXR:  01/01/15    Probable new enlarged nodule seen in the left perihilar region; CT scan of the chest with contrast administration is recommended for further evaluation.         Assessment & Plan:

## 2015-02-20 NOTE — Patient Instructions (Signed)
Please schedule a follow up visit in 6 months but call sooner if needed with cxr on return 

## 2015-02-23 DIAGNOSIS — S82031D Displaced transverse fracture of right patella, subsequent encounter for closed fracture with routine healing: Secondary | ICD-10-CM | POA: Diagnosis not present

## 2015-02-24 DIAGNOSIS — R2689 Other abnormalities of gait and mobility: Secondary | ICD-10-CM | POA: Diagnosis not present

## 2015-02-24 DIAGNOSIS — M6281 Muscle weakness (generalized): Secondary | ICD-10-CM | POA: Diagnosis not present

## 2015-02-24 DIAGNOSIS — N390 Urinary tract infection, site not specified: Secondary | ICD-10-CM | POA: Diagnosis not present

## 2015-02-25 NOTE — Assessment & Plan Note (Signed)
See CT chest 03/15/14  - not vz on cxr 07/05/14 and pt not wishing to pursue tissue dx for "microscopic" multifocal dz - "new" L mid perihilar nodules now seen on plain cxr was present on CT   So clearly these lesions are growing and are c/w metastatic dz in absenace of any clinical syndrome suggestive of infection and could probably be successfully biopsied by navigational fob or do pet and find alternative/ ? Easier tissue site but Discussed in detail all the  indications, usual  risks and alternatives  relative to the benefits with patient who agrees to proceed with conservative f/u as outlined > declines f/u in cxr o agreed to return in 6 m instead unless having symptoms  Total time devoted to counseling  =  15/25 min ov  reviewed case with pt/ discussion of options/alternatives/ giving and going over instructions (see avs)

## 2015-02-27 DIAGNOSIS — M6281 Muscle weakness (generalized): Secondary | ICD-10-CM | POA: Diagnosis not present

## 2015-02-27 DIAGNOSIS — R2689 Other abnormalities of gait and mobility: Secondary | ICD-10-CM | POA: Diagnosis not present

## 2015-02-28 DIAGNOSIS — M6281 Muscle weakness (generalized): Secondary | ICD-10-CM | POA: Diagnosis not present

## 2015-02-28 DIAGNOSIS — R2689 Other abnormalities of gait and mobility: Secondary | ICD-10-CM | POA: Diagnosis not present

## 2015-03-02 ENCOUNTER — Encounter: Payer: Medicare Other | Admitting: Internal Medicine

## 2015-03-06 DIAGNOSIS — R29898 Other symptoms and signs involving the musculoskeletal system: Secondary | ICD-10-CM | POA: Diagnosis not present

## 2015-03-06 DIAGNOSIS — M6281 Muscle weakness (generalized): Secondary | ICD-10-CM | POA: Diagnosis not present

## 2015-03-07 DIAGNOSIS — M6281 Muscle weakness (generalized): Secondary | ICD-10-CM | POA: Diagnosis not present

## 2015-03-07 DIAGNOSIS — R29898 Other symptoms and signs involving the musculoskeletal system: Secondary | ICD-10-CM | POA: Diagnosis not present

## 2015-03-09 DIAGNOSIS — M6281 Muscle weakness (generalized): Secondary | ICD-10-CM | POA: Diagnosis not present

## 2015-03-09 DIAGNOSIS — R29898 Other symptoms and signs involving the musculoskeletal system: Secondary | ICD-10-CM | POA: Diagnosis not present

## 2015-03-13 DIAGNOSIS — R29898 Other symptoms and signs involving the musculoskeletal system: Secondary | ICD-10-CM | POA: Diagnosis not present

## 2015-03-13 DIAGNOSIS — M6281 Muscle weakness (generalized): Secondary | ICD-10-CM | POA: Diagnosis not present

## 2015-03-14 DIAGNOSIS — S82031D Displaced transverse fracture of right patella, subsequent encounter for closed fracture with routine healing: Secondary | ICD-10-CM | POA: Diagnosis not present

## 2015-03-15 DIAGNOSIS — R29898 Other symptoms and signs involving the musculoskeletal system: Secondary | ICD-10-CM | POA: Diagnosis not present

## 2015-03-15 DIAGNOSIS — M6281 Muscle weakness (generalized): Secondary | ICD-10-CM | POA: Diagnosis not present

## 2015-03-16 ENCOUNTER — Encounter: Payer: Self-pay | Admitting: Internal Medicine

## 2015-03-16 IMAGING — CR DG CHEST 2V
2 series · 2 of 2 positions shown · non-contrast
Comparison: Chest x-ray 01/13/2013.

CLINICAL DATA: 89-year-old female with 1 week history of cough.
Prior history of smoking.

EXAM:
CHEST  2 VIEW

[PA]
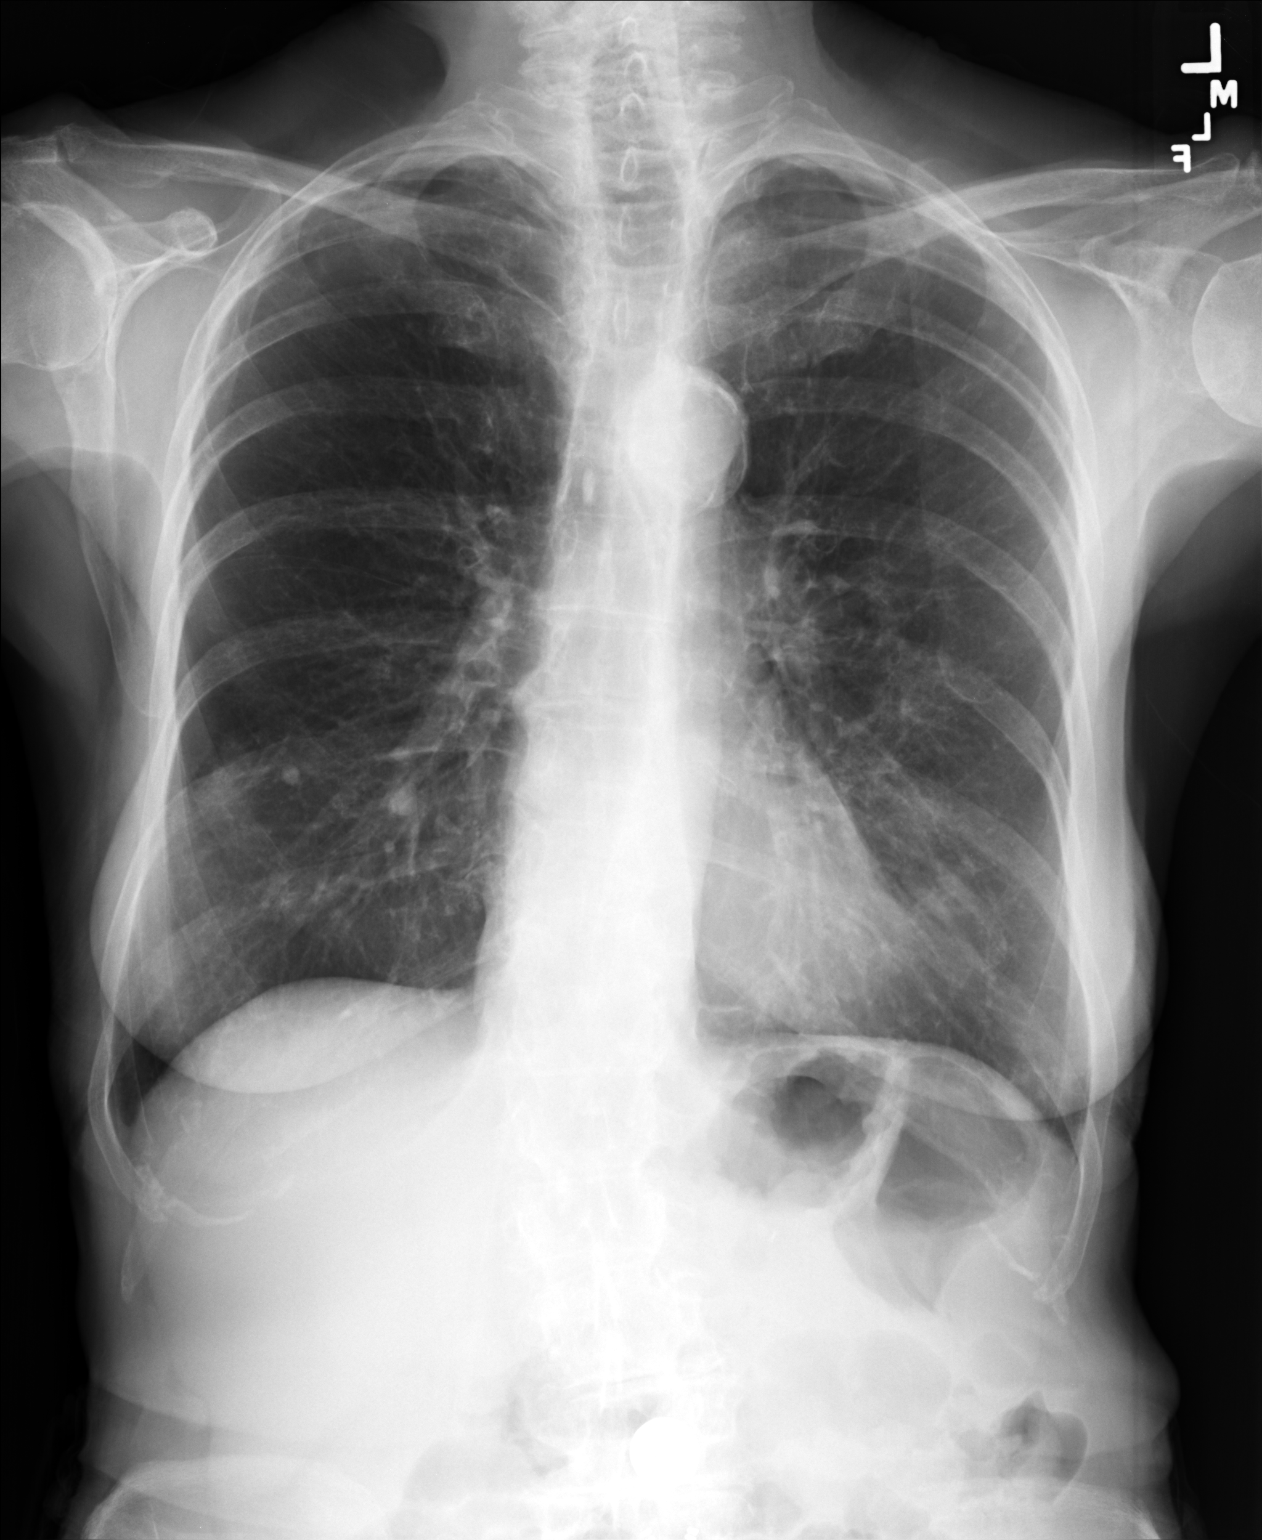

[lateral]
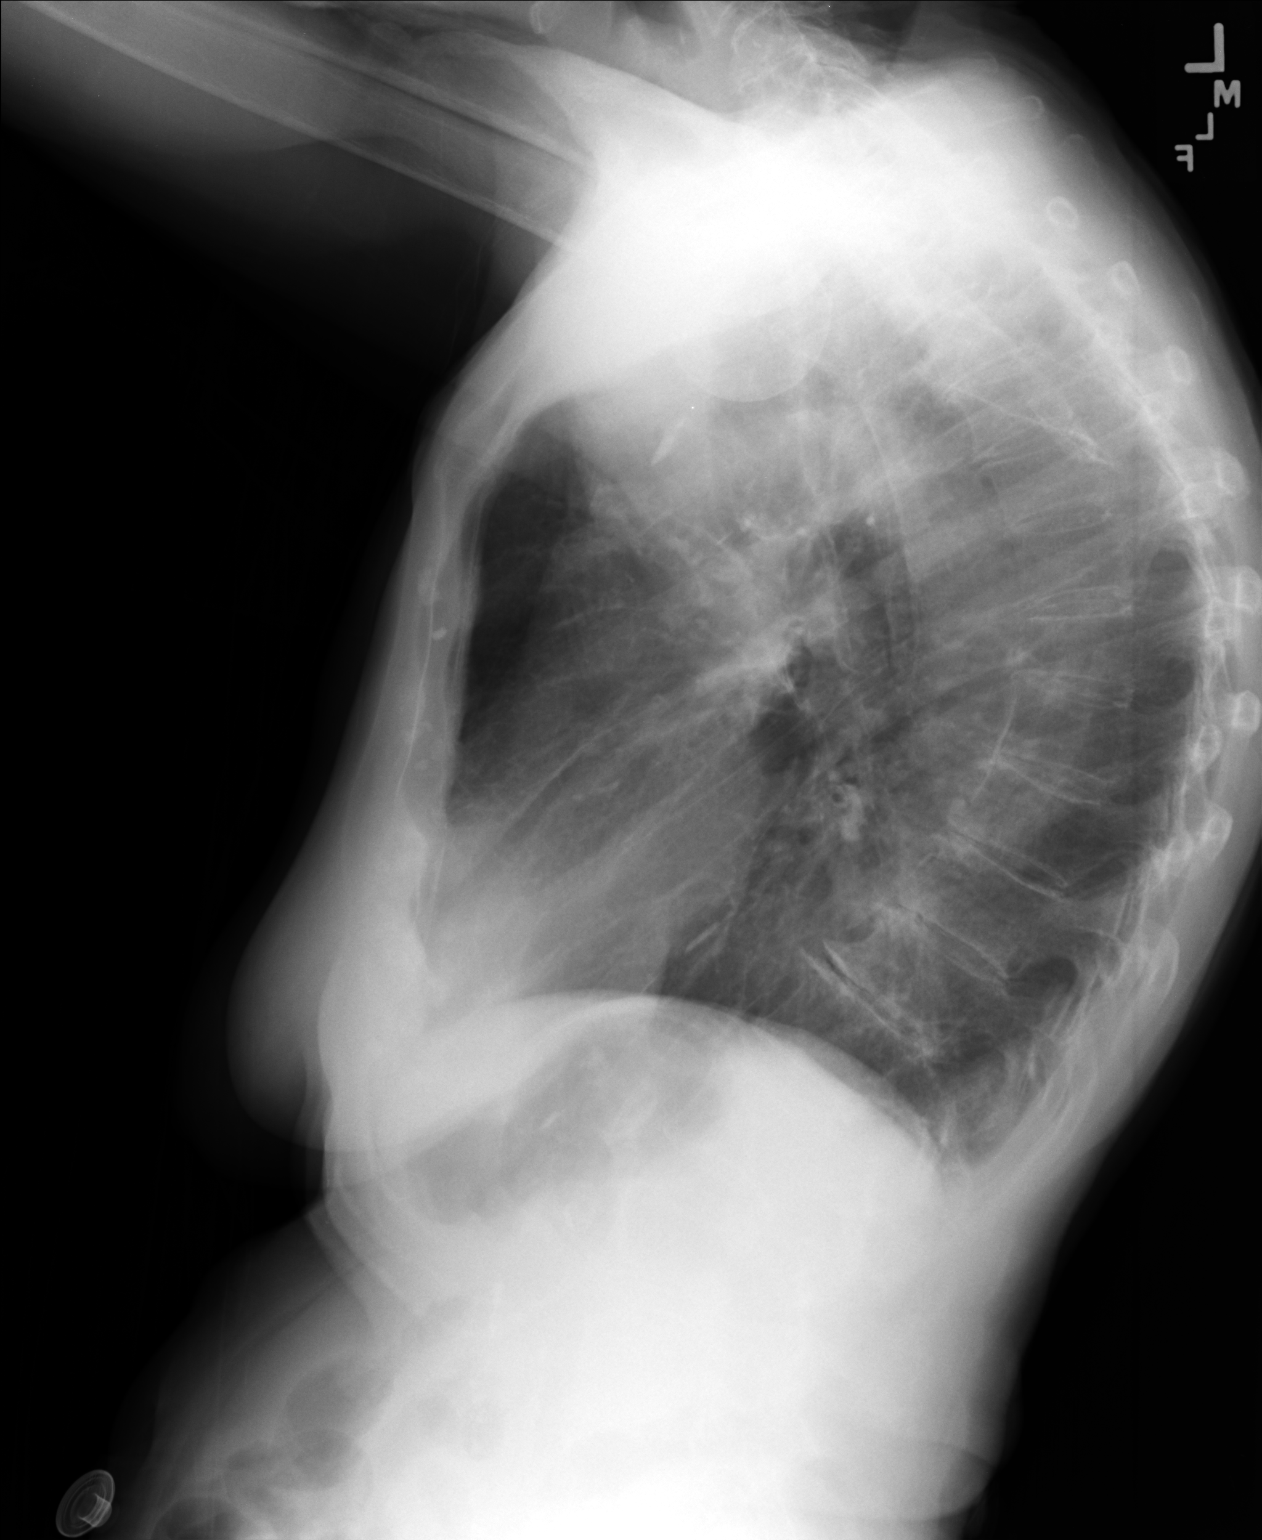

[2 of 2 positions shown; findings below may reference images not displayed]

FINDINGS: Emphysematous changes are noted in the lungs bilaterally. Small
dense nodules in the right mid to lower lung, similar to the prior
examination, presumably calcified granulomas. No other suspicious
appearing pulmonary nodules or masses. No acute consolidative
airspace disease. No pleural effusions. No evidence of pulmonary
edema. Heart size is normal. Upper mediastinal contours are within
normal limits. Calcified mediastinal and hilar lymph nodes.
Atherosclerosis in the thoracic aorta.
IMPRESSION: 1. No radiographic evidence of acute cardiopulmonary disease.
2. Sequela of old granulomatous disease, as above.
3. Atherosclerosis.
4. Emphysema.

## 2015-03-17 DIAGNOSIS — M6281 Muscle weakness (generalized): Secondary | ICD-10-CM | POA: Diagnosis not present

## 2015-03-17 DIAGNOSIS — R29898 Other symptoms and signs involving the musculoskeletal system: Secondary | ICD-10-CM | POA: Diagnosis not present

## 2015-03-20 DIAGNOSIS — M6281 Muscle weakness (generalized): Secondary | ICD-10-CM | POA: Diagnosis not present

## 2015-03-20 DIAGNOSIS — R29898 Other symptoms and signs involving the musculoskeletal system: Secondary | ICD-10-CM | POA: Diagnosis not present

## 2015-03-22 DIAGNOSIS — R29898 Other symptoms and signs involving the musculoskeletal system: Secondary | ICD-10-CM | POA: Diagnosis not present

## 2015-03-22 DIAGNOSIS — M6281 Muscle weakness (generalized): Secondary | ICD-10-CM | POA: Diagnosis not present

## 2015-03-23 DIAGNOSIS — R2689 Other abnormalities of gait and mobility: Secondary | ICD-10-CM | POA: Diagnosis not present

## 2015-03-23 DIAGNOSIS — R29898 Other symptoms and signs involving the musculoskeletal system: Secondary | ICD-10-CM | POA: Diagnosis not present

## 2015-03-23 DIAGNOSIS — M6281 Muscle weakness (generalized): Secondary | ICD-10-CM | POA: Diagnosis not present

## 2015-03-28 DIAGNOSIS — M6281 Muscle weakness (generalized): Secondary | ICD-10-CM | POA: Diagnosis not present

## 2015-03-28 DIAGNOSIS — R29898 Other symptoms and signs involving the musculoskeletal system: Secondary | ICD-10-CM | POA: Diagnosis not present

## 2015-03-28 DIAGNOSIS — R2689 Other abnormalities of gait and mobility: Secondary | ICD-10-CM | POA: Diagnosis not present

## 2015-03-29 DIAGNOSIS — M6281 Muscle weakness (generalized): Secondary | ICD-10-CM | POA: Diagnosis not present

## 2015-03-29 DIAGNOSIS — R2689 Other abnormalities of gait and mobility: Secondary | ICD-10-CM | POA: Diagnosis not present

## 2015-03-29 DIAGNOSIS — R29898 Other symptoms and signs involving the musculoskeletal system: Secondary | ICD-10-CM | POA: Diagnosis not present

## 2015-03-30 DIAGNOSIS — R2689 Other abnormalities of gait and mobility: Secondary | ICD-10-CM | POA: Diagnosis not present

## 2015-03-30 DIAGNOSIS — M6281 Muscle weakness (generalized): Secondary | ICD-10-CM | POA: Diagnosis not present

## 2015-03-30 DIAGNOSIS — R29898 Other symptoms and signs involving the musculoskeletal system: Secondary | ICD-10-CM | POA: Diagnosis not present

## 2015-04-04 DIAGNOSIS — R2689 Other abnormalities of gait and mobility: Secondary | ICD-10-CM | POA: Diagnosis not present

## 2015-04-04 DIAGNOSIS — M6281 Muscle weakness (generalized): Secondary | ICD-10-CM | POA: Diagnosis not present

## 2015-04-04 DIAGNOSIS — R29898 Other symptoms and signs involving the musculoskeletal system: Secondary | ICD-10-CM | POA: Diagnosis not present

## 2015-04-05 DIAGNOSIS — R29898 Other symptoms and signs involving the musculoskeletal system: Secondary | ICD-10-CM | POA: Diagnosis not present

## 2015-04-05 DIAGNOSIS — M6281 Muscle weakness (generalized): Secondary | ICD-10-CM | POA: Diagnosis not present

## 2015-04-05 DIAGNOSIS — R2689 Other abnormalities of gait and mobility: Secondary | ICD-10-CM | POA: Diagnosis not present

## 2015-04-06 DIAGNOSIS — M6281 Muscle weakness (generalized): Secondary | ICD-10-CM | POA: Diagnosis not present

## 2015-04-06 DIAGNOSIS — R29898 Other symptoms and signs involving the musculoskeletal system: Secondary | ICD-10-CM | POA: Diagnosis not present

## 2015-04-06 DIAGNOSIS — R2689 Other abnormalities of gait and mobility: Secondary | ICD-10-CM | POA: Diagnosis not present

## 2015-04-10 DIAGNOSIS — R2689 Other abnormalities of gait and mobility: Secondary | ICD-10-CM | POA: Diagnosis not present

## 2015-04-10 DIAGNOSIS — R29898 Other symptoms and signs involving the musculoskeletal system: Secondary | ICD-10-CM | POA: Diagnosis not present

## 2015-04-10 DIAGNOSIS — M6281 Muscle weakness (generalized): Secondary | ICD-10-CM | POA: Diagnosis not present

## 2015-04-11 DIAGNOSIS — S82031D Displaced transverse fracture of right patella, subsequent encounter for closed fracture with routine healing: Secondary | ICD-10-CM | POA: Diagnosis not present

## 2015-04-12 DIAGNOSIS — R29898 Other symptoms and signs involving the musculoskeletal system: Secondary | ICD-10-CM | POA: Diagnosis not present

## 2015-04-12 DIAGNOSIS — R2689 Other abnormalities of gait and mobility: Secondary | ICD-10-CM | POA: Diagnosis not present

## 2015-04-12 DIAGNOSIS — M6281 Muscle weakness (generalized): Secondary | ICD-10-CM | POA: Diagnosis not present

## 2015-04-27 ENCOUNTER — Encounter: Payer: Medicare Other | Admitting: Internal Medicine

## 2015-05-09 DIAGNOSIS — M25561 Pain in right knee: Secondary | ICD-10-CM | POA: Diagnosis not present

## 2015-05-09 DIAGNOSIS — M1712 Unilateral primary osteoarthritis, left knee: Secondary | ICD-10-CM | POA: Diagnosis not present

## 2015-05-10 DIAGNOSIS — Z23 Encounter for immunization: Secondary | ICD-10-CM | POA: Diagnosis not present

## 2015-05-10 DIAGNOSIS — L570 Actinic keratosis: Secondary | ICD-10-CM | POA: Diagnosis not present

## 2015-05-10 DIAGNOSIS — Z85828 Personal history of other malignant neoplasm of skin: Secondary | ICD-10-CM | POA: Diagnosis not present

## 2015-05-30 DIAGNOSIS — H01005 Unspecified blepharitis left lower eyelid: Secondary | ICD-10-CM | POA: Diagnosis not present

## 2015-05-30 DIAGNOSIS — H01002 Unspecified blepharitis right lower eyelid: Secondary | ICD-10-CM | POA: Diagnosis not present

## 2015-05-30 DIAGNOSIS — H01001 Unspecified blepharitis right upper eyelid: Secondary | ICD-10-CM | POA: Diagnosis not present

## 2015-05-30 DIAGNOSIS — H01004 Unspecified blepharitis left upper eyelid: Secondary | ICD-10-CM | POA: Diagnosis not present

## 2015-06-06 DIAGNOSIS — Z961 Presence of intraocular lens: Secondary | ICD-10-CM | POA: Diagnosis not present

## 2015-06-06 DIAGNOSIS — H524 Presbyopia: Secondary | ICD-10-CM | POA: Diagnosis not present

## 2015-06-06 DIAGNOSIS — H353132 Nonexudative age-related macular degeneration, bilateral, intermediate dry stage: Secondary | ICD-10-CM | POA: Diagnosis not present

## 2015-07-06 ENCOUNTER — Encounter: Payer: Self-pay | Admitting: Internal Medicine

## 2015-07-06 ENCOUNTER — Non-Acute Institutional Stay (INDEPENDENT_AMBULATORY_CARE_PROVIDER_SITE_OTHER): Payer: Medicare Other | Admitting: Internal Medicine

## 2015-07-06 VITALS — BP 124/62 | HR 72 | Temp 98.1°F | Ht 61.0 in | Wt 100.0 lb

## 2015-07-06 DIAGNOSIS — Z89421 Acquired absence of other right toe(s): Secondary | ICD-10-CM | POA: Diagnosis not present

## 2015-07-06 DIAGNOSIS — N393 Stress incontinence (female) (male): Secondary | ICD-10-CM | POA: Diagnosis not present

## 2015-07-06 DIAGNOSIS — R739 Hyperglycemia, unspecified: Secondary | ICD-10-CM

## 2015-07-06 DIAGNOSIS — S82001S Unspecified fracture of right patella, sequela: Secondary | ICD-10-CM

## 2015-07-06 DIAGNOSIS — K219 Gastro-esophageal reflux disease without esophagitis: Secondary | ICD-10-CM | POA: Diagnosis not present

## 2015-07-06 NOTE — Progress Notes (Signed)
Patient ID: [name], female   DOB: 08/01/1922, 82 y.o.   MRN: [id number]   Location:  [facility]  Provider: Jeanmarie Hubert, M.D.  Code Status: DNR Goals of Care:  Advanced Directives 07/06/2015  Does patient have an advance directive? Yes  Type of Advance Directive Living will;Out of facility DNR (pink MOST or yellow form)  Does patient want to make changes to advanced directive? -  Copy of advanced directive(s) in chart? -  Pre-existing out of facility DNR order (yellow form or pink MOST form) Yellow form placed in chart (order not valid for inpatient use)     Chief Complaint  Patient presents with  . Medical Management of Chronic Issues    GERD, left leg bothers her at night    HPI: Patient is a 82 y.o. female seen today for medical management of chronic diseases.    Last seen by me 02/09/2015 for close fracture of the right patella which caused her to be in the skilled nursing facility area of [facility]. She had had a fall. Other problems included pulmonary nodules, recurrent UTIs, GERD.  She is now back home. Left leg bothers her some at night. Having times when she is in bed that the left leg seems to move in lateral directions.  GERD - asymptomatic.  Right eye irritated yesterday when she had difficulty removing a new soft contact.   Past Medical History  Diagnosis Date  . Insomnia   . Sepsis due to Escherichia coli (E. coli) ([place])   . Stroke [facility])     TIA  . Arthritis   . Cancer (HCC)     squamous cell  . Incontinence of urine   . Allergy   . Oxygen deficiency   . GERD (gastroesophageal reflux disease)   . Squamous cell carcinoma of skin   . Female stress incontinence   . Frequent loose stools   . TIA (transient ischemic attack) 01/05/13    Past Surgical History  Procedure Laterality Date  . Orif patella Right 02/03/2015    Procedure: OPEN REDUCTION INTERNAL (ORIF) FIXATION RIGHT PATELLA;  Surgeon: Dorna Leitz, MD;  Location: [place];  Service: Orthopedics;   Laterality: Right;  . Breast surgery  1982    breast  . Tonsillectomy  1940  . Abdominal hysterectomy  1959  . Tee without cardioversion N/A 01/18/2013    Procedure: TRANSESOPHAGEAL ECHOCARDIOGRAM (TEE);  Surgeon: Lelon Perla, MD;  Location: [place];  Service: Cardiovascular;  Laterality: N/A;  . Appendectomy  1959  . Colonoscopy  2013    Dr. Carol Ada    Allergies  Allergen Reactions  . Penicillin G Itching    Has patient had a PCN reaction causing immediate rash, facial/tongue/throat swelling, SOB or lightheadedness with hypotension: No Has patient had a PCN reaction causing severe rash involving mucus membranes or skin necrosis: No Has patient had a PCN reaction that required hospitalization No Has patient had a PCN reaction occurring within the last 10 years: No If all of the above answers are "NO", then may proceed with Cephalosporin use.  Sarina Ill [Bactrim]     Stomach concerns.   . Penicillins Other (See Comments)    Per [facility]  . Septra [Sulfamethoxazole-Trimethoprim] Other (See Comments)    Per [facility]      Medication List       This list is accurate as of: 07/06/15  3:49 PM.  Always use your most recent med list.  acetaminophen 500 MG tablet  Commonly known as:  TYLENOL  Take 500 mg by mouth every 6 (six) hours as needed for mild pain, moderate pain or headache.     acetaminophen 325 MG tablet  Commonly known as:  TYLENOL  Take 650 mg by mouth every 4 (four) hours as needed for mild pain.     aspirin EC 81 MG tablet  Take 81 mg by mouth daily.     b complex vitamins tablet  Take 1 tablet by mouth daily.     I-VITE PO  Take 1 tablet by mouth daily.     beta carotene w/minerals tablet  Take 1 tablet by mouth daily.     OYSTER SHELL CALCIUM + D 500-200 MG-UNIT Tabs  Generic drug:  Calcium Carb-Cholecalciferol  Take 1 tablet by mouth daily.     CALCIUM 600 + D PO  Take 1 tablet by mouth daily.     loperamide 2 MG capsule    Commonly known as:  IMODIUM  Take 2 mg by mouth as needed for diarrhea or loose stools.     sodium chloride 5 % ophthalmic ointment  Commonly known as:  MURO 0000000  Place 1 application into the left eye daily.     SYSTANE ULTRA 0.4-0.3 % Soln  Generic drug:  Polyethyl Glycol-Propyl Glycol  Place 1 drop into both eyes 3 (three) times daily.     SYSTANE 0.4-0.3 % Gel ophthalmic gel  Generic drug:  Polyethyl Glycol-Propyl Glycol  Place 1 application into both eyes at bedtime.     vitamin C 1000 MG tablet  Take 1,000 mg by mouth daily.        Review of Systems:  Review of Systems  Constitutional: Negative.  Negative for fever, chills, diaphoresis, activity change, appetite change, fatigue and unexpected weight change.  HENT: Positive for hearing loss and voice change (hoarse). Negative for congestion, ear discharge, ear pain, postnasal drip, rhinorrhea, sore throat, tinnitus and trouble swallowing.   Eyes: Negative.  Negative for pain, redness, itching and visual disturbance.  Respiratory: Negative for cough, choking, chest tightness, shortness of breath and wheezing.   Cardiovascular: Negative for chest pain, palpitations and leg swelling.  Gastrointestinal: Positive for diarrhea. Negative for nausea, abdominal pain, constipation and abdominal distention.  Endocrine: Negative.  Negative for cold intolerance, heat intolerance, polydipsia, polyphagia and polyuria.  Genitourinary: Negative for dysuria, urgency, frequency, hematuria, flank pain, vaginal discharge, difficulty urinating and pelvic pain.       Incontinent of urine.  Musculoskeletal: Negative for myalgias, back pain, arthralgias, gait problem, neck pain and neck stiffness.       02/03/2015 ORIF of a closed displaced transverse fracture of right patella. Right leg splint. Left knee pain. Hx of tibial plateau fracture. Osteoarthritis of the fingers.  Skin: Negative.  Negative for color change, pallor and rash.   Allergic/Immunologic: Negative.   Neurological: Negative for dizziness, tremors, seizures, syncope, weakness, numbness and headaches.  Hematological: Negative.  Negative for adenopathy. Does not bruise/bleed easily.  Psychiatric/Behavioral: Negative for suicidal ideas, hallucinations, behavioral problems, confusion, sleep disturbance, dysphoric mood and agitation. The patient is not nervous/anxious and is not hyperactive.     Health Maintenance  Topic Date Due  . ZOSTAVAX  05/29/1984  . DEXA SCAN  05/29/1989  . PNA vac Low Risk Adult (2 of 2 - PPSV23) 12/27/2010  . INFLUENZA VACCINE  11/21/2015  . TETANUS/TDAP  01/14/2025    Physical Exam: Filed Vitals:   07/06/15 1543  BP:  124/62  Pulse: 72  Temp: 98.1 F (36.7 C)  TempSrc: Oral  Height: 5\' 1"  (1.549 m)  Weight: 100 lb (45.36 kg)  SpO2: 96%   Body mass index is 18.9 kg/(m^2). Physical Exam  Constitutional: She is oriented to person, place, and time. She appears well-developed and well-nourished. No distress.  HENT:  Head: Normocephalic and atraumatic.  Right Ear: External ear normal.  Left Ear: External ear normal.  Nose: Nose normal.  Mouth/Throat: Oropharynx is clear and moist. No oropharyngeal exudate.  Bilateral hearing aids. Profound loss of hearing.  Eyes: Conjunctivae and EOM are normal. Pupils are equal, round, and reactive to light. Right eye exhibits no discharge. Left eye exhibits no discharge. No scleral icterus.  Neck: Normal range of motion. Neck supple. No JVD present. No tracheal deviation present. No thyromegaly present.  Cardiovascular: Normal rate, regular rhythm, normal heart sounds and intact distal pulses.   No murmur heard. Pulmonary/Chest: Effort normal and breath sounds normal. No respiratory distress. She has no wheezes. She has no rales.  Abdominal: Soft. Bowel sounds are normal. She exhibits no distension. There is no tenderness.  Musculoskeletal: She exhibits edema and tenderness.  Left knee  pain.  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 Right knee in immobilizer, mild edema right ankle and foot.   Neurological: She is alert and oriented to person, place, and time. She displays normal reflexes. No cranial nerve deficit. She exhibits normal muscle tone. Coordination normal.  Skin: No rash noted. She is not diaphoretic. There is erythema. No pallor.  Brownish pigmented BLE  Psychiatric: She has a normal mood and affect. Her behavior is normal. Judgment and thought content normal.  Talks constantly and changes her subjects frequently.     Labs reviewed: Basic Metabolic Panel:  Recent Labs  01/03/15 0604 01/04/15 0555 02/03/15 1251  NA 134* 135 136  K 4.1 3.8 4.3  CL 103 105 101  CO2 24 23 25   GLUCOSE 90 86 92  BUN 22* 16 20  CREATININE 0.92 0.73 0.73  CALCIUM 8.0* 7.7* 8.8*   Liver Function Tests:  Recent Labs  01/02/15 1547 01/04/15 0555 02/03/15 1251  AST 35 32 27  ALT 28 36 19  ALKPHOS 80 79 64  BILITOT 0.6 0.5 1.0  PROT 6.2* 4.7* 6.0*  ALBUMIN 3.3* 2.2* 3.6   No results for input(s): LIPASE, AMYLASE in the last 8760 hours. No results for input(s): AMMONIA in the last 8760 hours. CBC:  Recent Labs  01/01/15 1554  01/02/15 1547 01/04/15 0555 02/03/15 1251  WBC 14.4*  --  14.7* 9.3 7.3  NEUTROABS 11.8*  --  12.4*  --  5.0  HGB 13.5  < > 13.0 11.5* 13.1  HCT 39.3  < > 36.4 34.1* 40.0  MCV 89.9  --  88.6 90.7 91.5  PLT 172  --  186 173 231  < > = values in this interval not displayed. Lipid Panel: No results for input(s): CHOL, HDL, LDLCALC, TRIG, CHOLHDL, LDLDIRECT in the last 8760 hours. Lab Results  Component Value Date   HGBA1C 6.0 06/14/2014    Procedures since last visit: No results found.  Assessment/Plan 1. Closed fracture of right patella, sequela resolved  2. Gastroesophageal reflux disease without esophagitis asymptomatic  3. History of amputation of lesser toe of right foot ([place])  4.  Hyperglycemia -CMP, FUTURE  5. Urine, incontinence, stress female Chronic and unchanged    Labs/tests ordered:  CMP Next appt:  EV, 7  mo

## 2015-07-13 ENCOUNTER — Telehealth: Payer: Self-pay

## 2015-07-13 DIAGNOSIS — H9190 Unspecified hearing loss, unspecified ear: Secondary | ICD-10-CM

## 2015-07-13 NOTE — Telephone Encounter (Signed)
Please complete referral to [facility] as requested.

## 2015-07-13 NOTE — Telephone Encounter (Signed)
Patient called the office to ask if you could call [facility] to tell them that she is in need of hearing test and possible hearing aid. [phone] ) she said this way her insurance would cover appointment. Please Advise

## 2015-07-26 DIAGNOSIS — H903 Sensorineural hearing loss, bilateral: Secondary | ICD-10-CM | POA: Diagnosis not present

## 2015-08-11 ENCOUNTER — Telehealth: Payer: Self-pay

## 2015-08-11 NOTE — Telephone Encounter (Signed)
She is a [facility] resident. I believe there is room on my  Schedule there next Thursday for her to be seen. We can decide issues about whether she needs an x-ray at that time. In the meantime, I would recommend try Aleve 1 tablet twice daily.

## 2015-08-11 NOTE — Telephone Encounter (Signed)
Discussed with patient, schedule appointment @ 1:45 pm on Thursday 08-17-15

## 2015-08-11 NOTE — Telephone Encounter (Signed)
Patient with back pain (worse on left), patient had an injury 1 week ago at [employer] in the bathroom (fall). Patient tried applying heat to the area 20 min 3-4 times daily and its still bothering her. Patient not sure if she needs to be seen or if she should have a Xray

## 2015-08-17 ENCOUNTER — Non-Acute Institutional Stay: Payer: Medicare Other | Admitting: Internal Medicine

## 2015-08-17 ENCOUNTER — Encounter: Payer: Self-pay | Admitting: Internal Medicine

## 2015-08-17 VITALS — BP 104/58 | HR 74 | Temp 98.0°F | Ht 61.0 in | Wt 99.0 lb

## 2015-08-17 DIAGNOSIS — M545 Low back pain, unspecified: Secondary | ICD-10-CM

## 2015-08-17 NOTE — Progress Notes (Signed)
Patient ID: [name], female   DOB: Feb 16, 1923, 82 y.o.   MRN: [id number]    [facility] ([facility]     Allergies  Allergen Reactions  . Penicillin G Itching    Has patient had a PCN reaction causing immediate rash, facial/tongue/throat swelling, SOB or lightheadedness with hypotension: No Has patient had a PCN reaction causing severe rash involving mucus membranes or skin necrosis: No Has patient had a PCN reaction that required hospitalization No Has patient had a PCN reaction occurring within the last 10 years: No If all of the above answers are "NO", then may proceed with Cephalosporin use.  Sarina Ill [Bactrim]     Stomach concerns.   . Penicillins Other (See Comments)    Per [facility]  . Septra [Sulfamethoxazole-Trimethoprim] Other (See Comments)    Per [facility]    Chief Complaint  Patient presents with  . Back Pain    around 08/10/15 was at [employer] fell in the bathroom, hit bottom, left hurt worse. Now hurts in lower/mid back. Been using heating pad and Aleve one twice daily    HPI:  Sacroiliac irritation bilaterally since she sat down with a jolt on a dept store toilet.. Aleve seems to be a little help. Has stopped exercising until she got permission to resume.  Medications: Patient's Medications  New Prescriptions   No medications on file  Previous Medications   ASCORBIC ACID (VITAMIN C) 1000 MG TABLET    Take 1,000 mg by mouth daily.   ASPIRIN EC 81 MG TABLET    Take 81 mg by mouth daily.    B COMPLEX VITAMINS TABLET    Take 1 tablet by mouth daily.   BETA CAROTENE W/MINERALS (OCUVITE) TABLET    Take 1 tablet by mouth daily.   CALCIUM CARB-CHOLECALCIFEROL (OYSTER SHELL CALCIUM + D) 500-200 MG-UNIT TABS    Take 1 tablet by mouth daily.   CALCIUM CARBONATE-VITAMIN D (CALCIUM 600 + D PO)    Take 1 tablet by mouth daily.   LOPERAMIDE (IMODIUM) 2 MG CAPSULE    Take 2 mg by mouth as needed for diarrhea or loose stools.   POLYETHYL  GLYCOL-PROPYL GLYCOL (SYSTANE ULTRA) 0.4-0.3 % SOLN    Place 1 drop into both eyes 3 (three) times daily.   POLYETHYL GLYCOL-PROPYL GLYCOL (SYSTANE) 0.4-0.3 % GEL OPHTHALMIC GEL    Place 1 application into both eyes at bedtime.   SODIUM CHLORIDE (MURO 128) 5 % OPHTHALMIC OINTMENT    Place 1 application into the left eye daily.  Modified Medications   No medications on file  Discontinued Medications   ACETAMINOPHEN (TYLENOL) 325 MG TABLET    Take 650 mg by mouth every 4 (four) hours as needed for mild pain.   ACETAMINOPHEN (TYLENOL) 500 MG TABLET    Take 500 mg by mouth every 6 (six) hours as needed for mild pain, moderate pain or headache.   MULTIPLE VITAMINS-MINERALS (I-VITE PO)    Take 1 tablet by mouth daily.     Review of Systems  Constitutional: Negative.  Negative for fever, chills, diaphoresis, activity change, appetite change, fatigue and unexpected weight change.  HENT: Positive for hearing loss and voice change (hoarse). Negative for congestion, ear discharge, ear pain, postnasal drip, rhinorrhea, sore throat, tinnitus and trouble swallowing.   Eyes: Negative.  Negative for pain, redness, itching and visual disturbance.  Respiratory: Negative for cough, choking, chest tightness, shortness of breath and wheezing.  Cardiovascular: Negative for chest pain, palpitations and leg swelling.  Gastrointestinal: Positive for diarrhea. Negative for nausea, abdominal pain, constipation and abdominal distention.  Endocrine: Negative.  Negative for cold intolerance, heat intolerance, polydipsia, polyphagia and polyuria.  Genitourinary: Negative for dysuria, urgency, frequency, hematuria, flank pain, vaginal discharge, difficulty urinating and pelvic pain.       Incontinent of urine.  Musculoskeletal: Negative for myalgias, back pain, arthralgias, gait problem, neck pain and neck stiffness.       02/03/2015 ORIF of a closed displaced transverse fracture of right patella. Right leg splint. Left  knee pain. Hx of tibial plateau fracture. Osteoarthritis of the fingers.  Skin: Negative.  Negative for color change, pallor and rash.  Allergic/Immunologic: Negative.   Neurological: Negative for dizziness, tremors, seizures, syncope, weakness, numbness and headaches.  Hematological: Negative.  Negative for adenopathy. Does not bruise/bleed easily.  Psychiatric/Behavioral: Negative for suicidal ideas, hallucinations, behavioral problems, confusion, sleep disturbance, dysphoric mood and agitation. The patient is not nervous/anxious and is not hyperactive.     Filed Vitals:   08/17/15 1402  BP: 104/58  Pulse: 74  Temp: 98 F (36.7 C)  TempSrc: Oral  Height: 5' 1"  (1.549 m)  Weight: 99 lb (44.906 kg)  SpO2: 95%   Wt Readings from Last 3 Encounters:  08/17/15 99 lb (44.906 kg)  07/06/15 100 lb (45.36 kg)  02/09/15 99 lb (44.906 kg)    Body mass index is 18.72 kg/(m^2).  Physical Exam  Constitutional: She is oriented to person, place, and time. She appears well-developed and well-nourished. No distress.  HENT:  Head: Normocephalic and atraumatic.  Right Ear: External ear normal.  Left Ear: External ear normal.  Nose: Nose normal.  Mouth/Throat: Oropharynx is clear and moist. No oropharyngeal exudate.  Bilateral hearing aids. Profound loss of hearing.  Eyes: Conjunctivae and EOM are normal. Pupils are equal, round, and reactive to light. Right eye exhibits no discharge. Left eye exhibits no discharge. No scleral icterus.  Neck: Normal range of motion. Neck supple. No JVD present. No tracheal deviation present. No thyromegaly present.  Cardiovascular: Normal rate, regular rhythm, normal heart sounds and intact distal pulses.   No murmur heard. Pulmonary/Chest: Effort normal and breath sounds normal. No respiratory distress. She has no wheezes. She has no rales.  Abdominal: Soft. Bowel sounds are normal. She exhibits no distension. There is no tenderness.  Musculoskeletal: She  exhibits edema and tenderness.  Left knee pain.  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 Tender SI joint areas bilaterally. Able to stand and sit unassisted.  Neurological: She is alert and oriented to person, place, and time. She displays normal reflexes. No cranial nerve deficit. She exhibits normal muscle tone. Coordination normal.  Skin: No rash noted. She is not diaphoretic. There is erythema. No pallor.  Brownish pigmented BLE  Psychiatric: She has a normal mood and affect. Her behavior is normal. Judgment and thought content normal.  Talks constantly and changes her subjects frequently.      Labs reviewed: Lab Summary Latest Ref Rng 02/03/2015 01/04/2015 01/03/2015 01/02/2015  Hemoglobin 12.0 - 15.0 g/dL 13.1 11.5(L) (None) 13.0  Hematocrit 36.0 - 46.0 % 40.0 34.1(L) (None) 36.4  White count 4.0 - 10.5 K/uL 7.3 9.3 (None) 14.7(H)  Platelet count 150 - 400 K/uL 231 173 (None) 186  Sodium 135 - 145 mmol/L 136 135 134(L) 129(L)  Potassium 3.5 - 5.1 mmol/L 4.3 3.8 4.1 4.3  Calcium 8.9 - 10.3 mg/dL 8.8(L) 7.7(L) 8.0(L) 8.7(L)  Phosphorus - (None) (None) (None) (None)  Creatinine 0.44 - 1.00 mg/dL 0.73 0.73 0.92 0.88  AST 15 - 41 U/L 27 32 (None) 35  Alk Phos 38 - 126 U/L 64 79 (None) 80  Bilirubin 0.3 - 1.2 mg/dL 1.0 0.5 (None) 0.6  Glucose 65 - 99 mg/dL 92 86 90 113(H)  Cholesterol - (None) (None) (None) (None)  HDL cholesterol - (None) (None) (None) (None)  Triglycerides - (None) (None) (None) (None)  LDL Direct - (None) (None) (None) (None)  LDL Calc - (None) (None) (None) (None)  Total protein 6.5 - 8.1 g/dL 6.0(L) 4.7(L) (None) 6.2(L)  Albumin 3.5 - 5.0 g/dL 3.6 2.2(L) (None) 3.3(L)   Lab Results  Component Value Date   TSH 1.844 01/13/2013   Lab Results  Component Value Date   BUN 20 02/03/2015   BUN 16 01/04/2015   BUN 22* 01/03/2015   Lab Results  Component Value Date   CREATININE 0.73 02/03/2015   CREATININE 0.73 01/04/2015   CREATININE  0.92 01/03/2015   Lab Results  Component Value Date   HGBA1C 6.0 06/14/2014   HGBA1C 6.3* 01/13/2013       Assessment/Plan  1. Midline low back pain without sciatica - likely sacaroillitis -Continue Aleve at least 2 more weeks -Resume exercises -Call for follow up appt if not improving in 2 weeks.

## 2015-08-21 ENCOUNTER — Ambulatory Visit: Payer: Medicare Other | Admitting: Internal Medicine

## 2015-08-30 ENCOUNTER — Telehealth: Payer: Self-pay

## 2015-08-30 NOTE — Telephone Encounter (Signed)
I would suggest referral to orthopedist. They would likely do an Xray and assess her hip pain. The most likely cause would be degenerative arthritis of the hip.

## 2015-08-30 NOTE — Telephone Encounter (Signed)
Patient called complaining of off/on left hip pain. Pain is worse at night. Patient applies heat and and takes aleve twice daily yet pain is still present. Patient would like to know the next step in addressing her pain. Please advise

## 2015-08-30 NOTE — Telephone Encounter (Signed)
Spoke with patient, patient aware of Dr.Green's recommendations. Patient is established with Dr.Graves. Patient states she will call Dr.Graves for an appointment and if they need anything from [place] they will call

## 2015-08-31 ENCOUNTER — Ambulatory Visit: Payer: BLUE CROSS/BLUE SHIELD | Admitting: Internal Medicine

## 2015-09-01 ENCOUNTER — Ambulatory Visit: Payer: BLUE CROSS/BLUE SHIELD | Admitting: Internal Medicine

## 2015-09-07 DIAGNOSIS — M545 Low back pain: Secondary | ICD-10-CM | POA: Diagnosis not present

## 2015-09-07 DIAGNOSIS — M1712 Unilateral primary osteoarthritis, left knee: Secondary | ICD-10-CM | POA: Diagnosis not present

## 2015-09-27 DIAGNOSIS — R2681 Unsteadiness on feet: Secondary | ICD-10-CM | POA: Diagnosis not present

## 2015-09-27 DIAGNOSIS — M545 Low back pain: Secondary | ICD-10-CM | POA: Diagnosis not present

## 2015-09-27 DIAGNOSIS — Z9181 History of falling: Secondary | ICD-10-CM | POA: Diagnosis not present

## 2015-09-27 DIAGNOSIS — M6281 Muscle weakness (generalized): Secondary | ICD-10-CM | POA: Diagnosis not present

## 2015-09-29 DIAGNOSIS — M6281 Muscle weakness (generalized): Secondary | ICD-10-CM | POA: Diagnosis not present

## 2015-09-29 DIAGNOSIS — M545 Low back pain: Secondary | ICD-10-CM | POA: Diagnosis not present

## 2015-09-29 DIAGNOSIS — Z9181 History of falling: Secondary | ICD-10-CM | POA: Diagnosis not present

## 2015-09-29 DIAGNOSIS — R2681 Unsteadiness on feet: Secondary | ICD-10-CM | POA: Diagnosis not present

## 2015-10-02 DIAGNOSIS — Z9181 History of falling: Secondary | ICD-10-CM | POA: Diagnosis not present

## 2015-10-02 DIAGNOSIS — M6281 Muscle weakness (generalized): Secondary | ICD-10-CM | POA: Diagnosis not present

## 2015-10-02 DIAGNOSIS — M545 Low back pain: Secondary | ICD-10-CM | POA: Diagnosis not present

## 2015-10-02 DIAGNOSIS — R2681 Unsteadiness on feet: Secondary | ICD-10-CM | POA: Diagnosis not present

## 2015-10-04 DIAGNOSIS — M6281 Muscle weakness (generalized): Secondary | ICD-10-CM | POA: Diagnosis not present

## 2015-10-04 DIAGNOSIS — M545 Low back pain: Secondary | ICD-10-CM | POA: Diagnosis not present

## 2015-10-04 DIAGNOSIS — Z9181 History of falling: Secondary | ICD-10-CM | POA: Diagnosis not present

## 2015-10-04 DIAGNOSIS — R2681 Unsteadiness on feet: Secondary | ICD-10-CM | POA: Diagnosis not present

## 2015-10-06 DIAGNOSIS — R2681 Unsteadiness on feet: Secondary | ICD-10-CM | POA: Diagnosis not present

## 2015-10-06 DIAGNOSIS — Z9181 History of falling: Secondary | ICD-10-CM | POA: Diagnosis not present

## 2015-10-06 DIAGNOSIS — M6281 Muscle weakness (generalized): Secondary | ICD-10-CM | POA: Diagnosis not present

## 2015-10-06 DIAGNOSIS — M545 Low back pain: Secondary | ICD-10-CM | POA: Diagnosis not present

## 2015-10-10 DIAGNOSIS — M545 Low back pain: Secondary | ICD-10-CM | POA: Diagnosis not present

## 2015-10-10 DIAGNOSIS — M6281 Muscle weakness (generalized): Secondary | ICD-10-CM | POA: Diagnosis not present

## 2015-10-10 DIAGNOSIS — Z9181 History of falling: Secondary | ICD-10-CM | POA: Diagnosis not present

## 2015-10-10 DIAGNOSIS — R2681 Unsteadiness on feet: Secondary | ICD-10-CM | POA: Diagnosis not present

## 2015-10-11 DIAGNOSIS — M545 Low back pain: Secondary | ICD-10-CM | POA: Diagnosis not present

## 2015-10-11 DIAGNOSIS — M6281 Muscle weakness (generalized): Secondary | ICD-10-CM | POA: Diagnosis not present

## 2015-10-11 DIAGNOSIS — R2681 Unsteadiness on feet: Secondary | ICD-10-CM | POA: Diagnosis not present

## 2015-10-11 DIAGNOSIS — Z9181 History of falling: Secondary | ICD-10-CM | POA: Diagnosis not present

## 2015-10-12 ENCOUNTER — Ambulatory Visit: Payer: BLUE CROSS/BLUE SHIELD | Admitting: Internal Medicine

## 2015-10-13 DIAGNOSIS — M545 Low back pain: Secondary | ICD-10-CM | POA: Diagnosis not present

## 2015-10-13 DIAGNOSIS — M6281 Muscle weakness (generalized): Secondary | ICD-10-CM | POA: Diagnosis not present

## 2015-10-13 DIAGNOSIS — Z9181 History of falling: Secondary | ICD-10-CM | POA: Diagnosis not present

## 2015-10-13 DIAGNOSIS — R2681 Unsteadiness on feet: Secondary | ICD-10-CM | POA: Diagnosis not present

## 2015-10-16 DIAGNOSIS — R2681 Unsteadiness on feet: Secondary | ICD-10-CM | POA: Diagnosis not present

## 2015-10-16 DIAGNOSIS — M545 Low back pain: Secondary | ICD-10-CM | POA: Diagnosis not present

## 2015-10-16 DIAGNOSIS — M6281 Muscle weakness (generalized): Secondary | ICD-10-CM | POA: Diagnosis not present

## 2015-10-16 DIAGNOSIS — Z9181 History of falling: Secondary | ICD-10-CM | POA: Diagnosis not present

## 2015-10-18 DIAGNOSIS — Z9181 History of falling: Secondary | ICD-10-CM | POA: Diagnosis not present

## 2015-10-18 DIAGNOSIS — M545 Low back pain: Secondary | ICD-10-CM | POA: Diagnosis not present

## 2015-10-18 DIAGNOSIS — R2681 Unsteadiness on feet: Secondary | ICD-10-CM | POA: Diagnosis not present

## 2015-10-18 DIAGNOSIS — M6281 Muscle weakness (generalized): Secondary | ICD-10-CM | POA: Diagnosis not present

## 2015-10-20 DIAGNOSIS — M6281 Muscle weakness (generalized): Secondary | ICD-10-CM | POA: Diagnosis not present

## 2015-10-20 DIAGNOSIS — R2681 Unsteadiness on feet: Secondary | ICD-10-CM | POA: Diagnosis not present

## 2015-10-20 DIAGNOSIS — M545 Low back pain: Secondary | ICD-10-CM | POA: Diagnosis not present

## 2015-10-20 DIAGNOSIS — Z9181 History of falling: Secondary | ICD-10-CM | POA: Diagnosis not present

## 2015-10-23 DIAGNOSIS — M545 Low back pain: Secondary | ICD-10-CM | POA: Diagnosis not present

## 2015-10-23 DIAGNOSIS — R2681 Unsteadiness on feet: Secondary | ICD-10-CM | POA: Diagnosis not present

## 2015-10-23 DIAGNOSIS — Z9181 History of falling: Secondary | ICD-10-CM | POA: Diagnosis not present

## 2015-10-23 DIAGNOSIS — M6281 Muscle weakness (generalized): Secondary | ICD-10-CM | POA: Diagnosis not present

## 2015-10-25 DIAGNOSIS — R2681 Unsteadiness on feet: Secondary | ICD-10-CM | POA: Diagnosis not present

## 2015-10-25 DIAGNOSIS — M545 Low back pain: Secondary | ICD-10-CM | POA: Diagnosis not present

## 2015-10-25 DIAGNOSIS — Z9181 History of falling: Secondary | ICD-10-CM | POA: Diagnosis not present

## 2015-10-25 DIAGNOSIS — M6281 Muscle weakness (generalized): Secondary | ICD-10-CM | POA: Diagnosis not present

## 2015-10-26 ENCOUNTER — Telehealth: Payer: Self-pay

## 2015-10-26 NOTE — Telephone Encounter (Signed)
Received note from [place] at [facility] to call patient about breast exam and labs. Called patient last breast exam 03/24/14 by [facility], had mammogram 08/08/14 3D, has appt for Complete physical 02/01/16, will do breast exam at that time. She is not having any problems. She is to have blood work in October prior to physical. Patient was find with this.

## 2015-11-13 ENCOUNTER — Ambulatory Visit (INDEPENDENT_AMBULATORY_CARE_PROVIDER_SITE_OTHER)
Admission: RE | Admit: 2015-11-13 | Discharge: 2015-11-13 | Disposition: A | Discharge status: Home / Self Care | Payer: Medicare Other | Source: Ambulatory Visit | Attending: Internal Medicine | Admitting: Internal Medicine

## 2015-11-13 ENCOUNTER — Encounter: Payer: Self-pay | Admitting: Internal Medicine

## 2015-11-13 ENCOUNTER — Ambulatory Visit (INDEPENDENT_AMBULATORY_CARE_PROVIDER_SITE_OTHER): Payer: Medicare Other | Admitting: Internal Medicine

## 2015-11-13 ENCOUNTER — Telehealth: Payer: Self-pay | Admitting: Internal Medicine

## 2015-11-13 VITALS — BP 128/74 | HR 72 | Ht 61.0 in | Wt 97.8 lb

## 2015-11-13 DIAGNOSIS — R918 Other nonspecific abnormal finding of lung field: Secondary | ICD-10-CM

## 2015-11-13 NOTE — Progress Notes (Signed)
Subjective:    Patient ID: [name], female    DOB: [birth year]    MRN: [id number]    Brief patient profile:  16 yowf quit smoking 1978 with tendency to allergy problems all her life consistenting of lots of nasal congestion down into chest and resolves over a few weeks s rx then onset  Nov 2015 with fever 101 [facility]>  urgent care cxr > ct > mpn's so referred to pulmonary clinic  03/30/2014    History of Present Illness  03/30/2014 1st [facility] office visit/ [name]   Chief Complaint  Patient presents with  . Pulmonary Consult    Referred by Dr. Arlyss Queen for eval of pulmonary nodules. Pt states had "allergy attack" a couple wks ago- had alot of cough and fever at that time and so cxr was done and then ct chest.   Back to baseline now and very sedendentary but Not limited by breathing from desired activities   rec No change rx/ f/u cxr on return   11/13/2015  f/u ov/[name] re:  MPNs largest L perihilar  Chief Complaint  Patient presents with  . Follow-up    Pt states she is doing well and denies any co's today.      Not limited by breathing from desired activities    No obvious day to day or daytime variability or assoc chronic cough or cp or chest tightness, subjective wheeze or overt sinus or hb symptoms. No unusual exp hx or h/o childhood pna/ asthma or knowledge of premature birth.  Sleeping ok without nocturnal  or early am exacerbation  of respiratory  c/o's or need for noct saba. Also denies any obvious fluctuation of symptoms with weather or environmental changes or other aggravating or alleviating factors except as outlined above   Current Medications, Allergies, Complete Past Medical History, Past Surgical History, Family History, and Social History were reviewed in [employer] record.  ROS  The following are not active complaints unless bolded sore throat, dysphagia, dental problems, itching, sneezing,  nasal congestion  or excess/ purulent secretions, ear ache,   fever, chills, sweats, unintended wt loss, classically pleuritic or exertional cp, hemoptysis,  orthopnea pnd or leg swelling, presyncope, palpitations, abdominal pain, anorexia, nausea, vomiting, diarrhea  or change in bowel or bladder habits, change in stools or urine, dysuria,hematuria,  rash, arthralgias, visual complaints, headache, numbness, weakness or ataxia or problems with walking or coordination,  change in mood/affect or memory.            Objective:   Physical Exam  amb very pleasant wf nad    11/13/2015        97  07/05/14 99 lb (44.906 kg)  03/30/14 100 lb (45.36 kg)  03/24/14 98 lb (44.453 kg)    Vital signs reviewed   HEENT: nl dentition, turbinates, and orophanx. Nl external ear canals without cough reflex   NECK :  without JVD/Nodes/TM/ nl carotid upstrokes bilaterally   LUNGS: no acc muscle use, clear to A and P bilaterally without cough on insp or exp maneuvers   CV:  RRR  no s3 or murmur or increase in P2, no edema   ABD:  soft and nontender with nl excursion in the supine position. No bruits or organomegaly, bowel sounds nl  MS:  warm without deformities, calf tenderness, cyanosis or clubbing  SKIN: warm and dry without lesions    NEURO:  alert, approp, no deficits  CXR PA and Lateral:   11/13/2015 :    I personally reviewed images and agree with radiology impression as follows:    Left perihilar nodule better seen on today's study and possibly slightly larger         Assessment & Plan:

## 2015-11-13 NOTE — Telephone Encounter (Signed)
Notes Recorded by Tanda Rockers, MD on 11/13/2015 at 1:26 PM EDT Call pt: Reviewed cxr and no acute change so no change in recommendations made at ov  LVM for pt to return call.

## 2015-11-13 NOTE — Patient Instructions (Signed)
Please remember to go to the x-ray department downstairs for your tests - we will call you with the results when they are available.     Follow up will be as needed for pain when you breath or difficulty breathing or persistent cough

## 2015-11-13 NOTE — Progress Notes (Signed)
LMTCB

## 2015-11-14 DIAGNOSIS — M1712 Unilateral primary osteoarthritis, left knee: Secondary | ICD-10-CM | POA: Diagnosis not present

## 2015-11-14 NOTE — Telephone Encounter (Signed)
Pt retuening call to get xray results.[name]

## 2015-11-14 NOTE — Telephone Encounter (Signed)
Called and spoke with pt and she is aware of cxr results per MW.

## 2015-11-15 ENCOUNTER — Encounter: Payer: Self-pay | Admitting: Internal Medicine

## 2015-11-15 NOTE — Assessment & Plan Note (Signed)
See CT chest 03/15/14  - not vz on cxr 07/05/14 and pt not wishing to pursue tissue dx for "microscopic" multifocal dz - "new" L mid perihilar nodules now seen on plain cxr was present on CT  -  11/13/2015 does not desired further f/u > see ov   I had an extended final summary discussion with the patient reviewing all relevant studies completed to date and  lasting 15 to 20 minutes of a 25 minute visit on the following issues:    Concerned that the L perihilar density is slowing growing and likely represents a neoplasm though given she has MPN's not at all clear this is primary lung ca, the point in moot as she refuses bx but may be willing to undergo palliative rx  Should she develop specific symptoms related to the nodule(s)  Discussed in detail all the  indications, usual  risks and alternatives  relative to the benefits with patient who agrees to proceed with conservative f/u as outlined = prn

## 2015-11-28 DIAGNOSIS — H353132 Nonexudative age-related macular degeneration, bilateral, intermediate dry stage: Secondary | ICD-10-CM | POA: Diagnosis not present

## 2015-12-14 ENCOUNTER — Non-Acute Institutional Stay: Payer: Medicare Other | Admitting: Nurse Practitioner

## 2015-12-14 ENCOUNTER — Encounter: Payer: Self-pay | Admitting: Nurse Practitioner

## 2015-12-14 DIAGNOSIS — R42 Dizziness and giddiness: Secondary | ICD-10-CM

## 2015-12-14 DIAGNOSIS — N393 Stress incontinence (female) (male): Secondary | ICD-10-CM | POA: Diagnosis not present

## 2015-12-14 DIAGNOSIS — K219 Gastro-esophageal reflux disease without esophagitis: Secondary | ICD-10-CM

## 2015-12-14 NOTE — Assessment & Plan Note (Signed)
CBC, CMP, TSH, UA C/S, CT head. Carotid artery [place]

## 2015-12-14 NOTE — Progress Notes (Signed)
Location:   [facility]    Place of Service:  Clinic (12) Provider: Marlana Latus NP  Code Status: DNR Goals of Care: [place] 12/14/2015  Does patient have an advance directive? Yes  Type of Advance Directive Living will;Out of facility DNR (pink MOST or yellow form)  Does patient want to make changes to advanced directive? No - Patient declined  Copy of advanced directive(s) in chart? Yes  Pre-existing out of facility DNR order (yellow form or pink MOST form) -     Chief Complaint  Patient presents with  . Acute Visit    dizziness x 2 mos     HPI: Patient is a 82 y.o. female seen today for an acute visit for dizziness, denied focal weakness, denied chest pain, palpitation, change of vision, denied nausea, vomiting, constipation.    Hx of TIA, GERD, carotid arthrosclerosis, lower back pain, urine frequency.   Past Medical History:  Diagnosis Date  . Allergy   . Arthritis   . Cancer (HCC)    squamous cell  . Female stress incontinence   . Frequent loose stools   . GERD (gastroesophageal reflux disease)   . Incontinence of urine   . Insomnia   . Oxygen deficiency   . Sepsis due to Escherichia coli (E. coli) ([place])   . Squamous cell carcinoma of skin   . Stroke [facility])    TIA  . TIA (transient ischemic attack) 01/05/13    Past Surgical History:  Procedure Laterality Date  . ABDOMINAL HYSTERECTOMY  1959  . APPENDECTOMY  1959  . BREAST SURGERY  1982   breast  . COLONOSCOPY  2013   Dr. Carol Ada  . ORIF PATELLA Right 02/03/2015   Procedure: OPEN REDUCTION INTERNAL (ORIF) FIXATION RIGHT PATELLA;  Surgeon: Dorna Leitz, MD;  Location: [place];  Service: Orthopedics;  Laterality: Right;  . TEE WITHOUT CARDIOVERSION N/A 01/18/2013   Procedure: TRANSESOPHAGEAL ECHOCARDIOGRAM (TEE);  Surgeon: Lelon Perla, MD;  Location: [facility] ENDOSCOPY;  Service: Cardiovascular;  Laterality: N/A;  . TONSILLECTOMY  1940    Allergies  Allergen Reactions  . Penicillin G Itching   Has patient had a PCN reaction causing immediate rash, facial/tongue/throat swelling, SOB or lightheadedness with hypotension: No Has patient had a PCN reaction causing severe rash involving mucus membranes or skin necrosis: No Has patient had a PCN reaction that required hospitalization No Has patient had a PCN reaction occurring within the last 10 years: No If all of the above answers are "NO", then may proceed with Cephalosporin use.  Sarina Ill [Bactrim]     Stomach concerns.   . Penicillins Other (See Comments)    Per [facility]  . Septra [Sulfamethoxazole-Trimethoprim] Other (See Comments)    Per [facility]      Medication List       Accurate as of 12/14/15  4:54 PM. Always use your most recent med list.          aspirin EC 81 MG tablet Take 81 mg by mouth daily.   b complex vitamins tablet Take 1 tablet by mouth daily.   beta carotene w/minerals tablet Take 1 tablet by mouth daily.   OYSTER SHELL CALCIUM + D 500-200 MG-UNIT Tabs Generic drug:  Calcium Carb-Cholecalciferol Take 1 tablet by mouth daily.   CALCIUM 600 + D PO Take 1 tablet by mouth daily.   loperamide 2 MG capsule Commonly known as:  IMODIUM Take 2 mg by mouth as needed for diarrhea or loose stools.  sodium chloride 5 % ophthalmic ointment Commonly known as:  MURO 0000000 Place 1 application into the left eye daily.   SYSTANE ULTRA 0.4-0.3 % Soln Generic drug:  Polyethyl Glycol-Propyl Glycol Place 1 drop into both eyes 3 (three) times daily.   SYSTANE 0.4-0.3 % Gel ophthalmic gel Generic drug:  Polyethyl Glycol-Propyl Glycol Place 1 application into both eyes at bedtime.   vitamin C 1000 MG tablet Take 1,000 mg by mouth daily.       Review of Systems:  Review of Systems  Constitutional: Negative.  Negative for activity change, appetite change, chills, diaphoresis, fatigue, fever and unexpected weight change.  HENT: Positive for hearing loss and voice change (hoarse). Negative for congestion, ear  discharge, ear pain, postnasal drip, rhinorrhea, sore throat, tinnitus and trouble swallowing.   Eyes: Negative.  Negative for pain, redness, itching and visual disturbance.  Respiratory: Negative for cough, choking, chest tightness, shortness of breath and wheezing.   Cardiovascular: Negative for chest pain, palpitations and leg swelling.  Gastrointestinal: Positive for diarrhea. Negative for abdominal distention, abdominal pain, constipation and nausea.  Endocrine: Negative.  Negative for cold intolerance, heat intolerance, polydipsia, polyphagia and polyuria.  Genitourinary: Negative for difficulty urinating, dysuria, flank pain, frequency, hematuria, pelvic pain, urgency and vaginal discharge.       Incontinent of urine.  Musculoskeletal: Negative for arthralgias, back pain, gait problem, myalgias, neck pain and neck stiffness.       02/03/2015 ORIF of a closed displaced transverse fracture of right patella. Right leg splint. Left knee pain. Hx of tibial plateau fracture. Osteoarthritis of the fingers.  Skin: Negative.  Negative for color change, pallor and rash.  Allergic/Immunologic: Negative.   Neurological: Positive for dizziness. Negative for tremors, seizures, syncope, weakness, numbness and headaches.  Hematological: Negative.  Negative for adenopathy. Does not bruise/bleed easily.  Psychiatric/Behavioral: Negative for agitation, behavioral problems, confusion, dysphoric mood, hallucinations, sleep disturbance and suicidal ideas. The patient is not nervous/anxious and is not hyperactive.     Health Maintenance  Topic Date Due  . ZOSTAVAX  05/29/1984  . DEXA SCAN  05/29/1989  . PNA vac Low Risk Adult (2 of 2 - PPSV23) 12/27/2010  . INFLUENZA VACCINE  11/21/2015  . TETANUS/TDAP  01/14/2025    Physical Exam: Vitals:   12/14/15 1611  BP: (!) 142/74  Pulse: 70  Resp: 20  Temp: 98 F (36.7 C)  TempSrc: Oral  Weight: 99 lb 3.2 oz (45 kg)  Height: 5\' 1"  (1.549 m)   Body  mass index is 18.74 kg/m. Physical Exam  Constitutional: She is oriented to person, place, and time. She appears well-developed and well-nourished. No distress.  HENT:  Head: Normocephalic and atraumatic.  Right Ear: External ear normal.  Left Ear: External ear normal.  Nose: Nose normal.  Mouth/Throat: Oropharynx is clear and moist. No oropharyngeal exudate.  Bilateral hearing aids. Profound loss of hearing.  Eyes: Conjunctivae and EOM are normal. Pupils are equal, round, and reactive to light. Right eye exhibits no discharge. Left eye exhibits no discharge. No scleral icterus.  Neck: Normal range of motion. Neck supple. No JVD present. No tracheal deviation present. No thyromegaly present.  Cardiovascular: Normal rate, regular rhythm, normal heart sounds and intact distal pulses.   No murmur heard. Pulmonary/Chest: Effort normal and breath sounds normal. No respiratory distress. She has no wheezes. She has no rales.  Abdominal: Soft. Bowel sounds are normal. She exhibits no distension. There is no tenderness.  Musculoskeletal: She exhibits edema and tenderness.  Left knee pain.  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 Tender SI joint areas bilaterally. Able to stand and sit unassisted.  Neurological: She is alert and oriented to person, place, and time. She displays normal reflexes. No cranial nerve deficit. She exhibits normal muscle tone. Coordination normal.  Skin: No rash noted. She is not diaphoretic. There is erythema. No pallor.  Brownish pigmented BLE  Psychiatric: She has a normal mood and affect. Her behavior is normal. Judgment and thought content normal.  Talks constantly and changes her subjects frequently.     Labs reviewed: Basic Metabolic Panel:  Recent Labs  01/03/15 0604 01/04/15 0555 02/03/15 1251  NA 134* 135 136  K 4.1 3.8 4.3  CL 103 105 101  CO2 24 23 25   GLUCOSE 90 86 92  BUN 22* 16 20  CREATININE 0.92 0.73 0.73  CALCIUM  8.0* 7.7* 8.8*   Liver Function Tests:  Recent Labs  01/02/15 1547 01/04/15 0555 02/03/15 1251  AST 35 32 27  ALT 28 36 19  ALKPHOS 80 79 64  BILITOT 0.6 0.5 1.0  PROT 6.2* 4.7* 6.0*  ALBUMIN 3.3* 2.2* 3.6   No results for input(s): LIPASE, AMYLASE in the last 8760 hours. No results for input(s): AMMONIA in the last 8760 hours. CBC:  Recent Labs  01/01/15 1554  01/02/15 1547 01/04/15 0555 02/03/15 1251  WBC 14.4*  --  14.7* 9.3 7.3  NEUTROABS 11.8*  --  12.4*  --  5.0  HGB 13.5  < > 13.0 11.5* 13.1  HCT 39.3  < > 36.4 34.1* 40.0  MCV 89.9  --  88.6 90.7 91.5  PLT 172  --  186 173 231  < > = values in this interval not displayed. Lipid Panel: No results for input(s): CHOL, HDL, LDLCALC, TRIG, CHOLHDL, LDLDIRECT in the last 8760 hours. Lab Results  Component Value Date   HGBA1C 6.0 06/14/2014    Procedures since last visit: No results found.  Assessment/Plan Urine, incontinence, stress female Urinary frequency, 4-5x/night, update UA C/S  GERD (gastroesophageal reflux disease) stable  Dizziness and giddiness CBC, CMP, TSH, UA C/S, CT head. Carotid artery [place]     Labs/tests ordered:  @ORDERS @ CBC, CMP, TSH, CT head. UA C/S, carotid artery [place] R+L  Next appt:  02/01/2016

## 2015-12-14 NOTE — Assessment & Plan Note (Signed)
Urinary frequency, 4-5x/night, update UA C/S

## 2015-12-14 NOTE — Assessment & Plan Note (Signed)
stable °

## 2015-12-19 DIAGNOSIS — R42 Dizziness and giddiness: Secondary | ICD-10-CM | POA: Diagnosis not present

## 2015-12-19 DIAGNOSIS — R609 Edema, unspecified: Secondary | ICD-10-CM | POA: Diagnosis not present

## 2015-12-19 DIAGNOSIS — R739 Hyperglycemia, unspecified: Secondary | ICD-10-CM | POA: Diagnosis not present

## 2015-12-19 DIAGNOSIS — R509 Fever, unspecified: Secondary | ICD-10-CM | POA: Diagnosis not present

## 2015-12-19 LAB — HEPATIC FUNCTION PANEL
ALK PHOS: 54 U/L (ref 25–125)
ALT: 17 U/L (ref 7–35)
AST: 21 U/L (ref 13–35)
Bilirubin, Total: 0.5 mg/dL

## 2015-12-19 LAB — CBC AND DIFFERENTIAL
HCT: 41 % (ref 36–46)
Hemoglobin: 13.9 g/dL (ref 12.0–16.0)
PLATELETS: 211 10*3/uL (ref 150–399)
WBC: 6.2 10^3/mL

## 2015-12-19 LAB — BASIC METABOLIC PANEL
BUN: 22 mg/dL — AB (ref 4–21)
CREATININE: 0.8 mg/dL (ref 0.5–1.1)
GLUCOSE: 82 mg/dL
Potassium: 4.6 mmol/L (ref 3.4–5.3)
SODIUM: 137 mmol/L (ref 137–147)

## 2015-12-19 LAB — TSH: TSH: 4.03 u[IU]/mL (ref 0.41–5.90)

## 2015-12-20 ENCOUNTER — Encounter: Payer: Self-pay | Admitting: *Deleted

## 2015-12-21 ENCOUNTER — Telehealth: Payer: Self-pay

## 2015-12-21 MED ORDER — NITROFURANTOIN MONOHYD MACRO 100 MG PO CAPS: ORAL_CAPSULE | ORAL | 0 refills | Status: DC

## 2015-12-21 NOTE — Telephone Encounter (Signed)
Left message on patient phone recorder, the urine shows that she has urine infection, we will call medication to [facility], if she has any questions she can call the office. 12/19/15 Urine culture 100,000 E-coli, Macrobid 100mg  #20 one twice daily for UTI for 10 days.

## 2015-12-26 ENCOUNTER — Telehealth: Payer: Self-pay

## 2015-12-26 MED ORDER — CIPROFLOXACIN HCL 250 MG PO TABS: ORAL_TABLET | ORAL | 0 refills | Status: DC

## 2015-12-26 NOTE — Telephone Encounter (Signed)
Called patient, left message on her voice mail, stop the [place]. We will call in Cipro to [facility]. Be sure to take the medication with food. Call the office at [phone] if any problems.

## 2015-12-26 NOTE — Telephone Encounter (Signed)
Tiffany from [facility] clinic nurse called, patient called her that the Macrobid is causing her to be nausea, could we call something else in for her UTI at [facility]. Will forward message to Dr. Nyoka Cowden.

## 2015-12-26 NOTE — Telephone Encounter (Signed)
Discontinue Macrobid. Call in Prescription for ciprofloxacin 250 mg (14 tablets) one twice daily for infection

## 2015-12-28 NOTE — Addendum Note (Signed)
Addended by: Logan Bores on: 12/28/2015 10:40 AM   Modules accepted: Orders

## 2015-12-29 ENCOUNTER — Encounter: Payer: Self-pay | Admitting: Nurse Practitioner

## 2015-12-29 ENCOUNTER — Non-Acute Institutional Stay: Payer: Medicare Other | Admitting: Nurse Practitioner

## 2015-12-29 DIAGNOSIS — K219 Gastro-esophageal reflux disease without esophagitis: Secondary | ICD-10-CM

## 2015-12-29 DIAGNOSIS — H578 Other specified disorders of eye and adnexa: Secondary | ICD-10-CM

## 2015-12-29 DIAGNOSIS — H5789 Other specified disorders of eye and adnexa: Secondary | ICD-10-CM

## 2015-12-29 DIAGNOSIS — N390 Urinary tract infection, site not specified: Secondary | ICD-10-CM | POA: Diagnosis not present

## 2015-12-29 DIAGNOSIS — R42 Dizziness and giddiness: Secondary | ICD-10-CM | POA: Diagnosis not present

## 2015-12-29 NOTE — Progress Notes (Signed)
Location:   [place] Room Number: [id number] of Service:  SNF (31) Provider: Marlana Latus NP  Code Status: DNR Goals of Care: [place] 12/29/2015  Does patient have an advance directive? Yes  Type of Advance Directive Living will;Out of facility DNR (pink MOST or yellow form)  Does patient want to make changes to advanced directive? No - Patient declined  Copy of advanced directive(s) in chart? Yes  Pre-existing out of facility DNR order (yellow form or pink MOST form) -     Chief Complaint  Patient presents with  . Acute Visit    more confused, gait issues, not feeling well    HPI: Patient is a 82 y.o. female seen today for UTI, right eye scratchy sensation.   01/26/15 urine culture, E Coli, complete 7 day course of Nitrofurantoin 100mg  bid started 01/30/15, repeat UA c/s upon completion of ABT 12/19/15 Urine culture 100,000 E-coli, Macrobid 100mg  #20 one twice daily for UTI for 10 days.   12/29/15 dc'd NItrofurantoin due to nausea and leg pain,  hearing music, better nausea with Cipro 250mg  bid x 7 days. C/o sore right eye   Hx of TIA, GERD, carotid arthrosclerosis, lower back pain, urine frequency, unsteady gait-pending Carotid artery, CT head  Past Medical History:  Diagnosis Date  . Allergy   . Arthritis   . Cancer (HCC)    squamous cell  . Female stress incontinence   . Frequent loose stools   . GERD (gastroesophageal reflux disease)   . Incontinence of urine   . Insomnia   . Oxygen deficiency   . Sepsis due to Escherichia coli (E. coli) ([place])   . Squamous cell carcinoma of skin   . Stroke [facility])    TIA  . TIA (transient ischemic attack) 01/05/13    Past Surgical History:  Procedure Laterality Date  . ABDOMINAL HYSTERECTOMY  1959  . APPENDECTOMY  1959  . BREAST SURGERY  1982   breast  . COLONOSCOPY  2013   Dr. Carol Ada  . ORIF PATELLA Right 02/03/2015   Procedure: OPEN REDUCTION INTERNAL (ORIF) FIXATION RIGHT PATELLA;  Surgeon: Dorna Leitz, MD;  Location: [place];  Service: Orthopedics;  Laterality: Right;  . TEE WITHOUT CARDIOVERSION N/A 01/18/2013   Procedure: TRANSESOPHAGEAL ECHOCARDIOGRAM (TEE);  Surgeon: Lelon Perla, MD;  Location: [facility] ENDOSCOPY;  Service: Cardiovascular;  Laterality: N/A;  . TONSILLECTOMY  1940    Allergies  Allergen Reactions  . Macrobid [Nitrofurantoin Monohyd Macro] Nausea Only  . Penicillin G Itching    Has patient had a PCN reaction causing immediate rash, facial/tongue/throat swelling, SOB or lightheadedness with hypotension: No Has patient had a PCN reaction causing severe rash involving mucus membranes or skin necrosis: No Has patient had a PCN reaction that required hospitalization No Has patient had a PCN reaction occurring within the last 10 years: No If all of the above answers are "NO", then may proceed with Cephalosporin use.  Sarina Ill [Bactrim]     Stomach concerns.   . Penicillins Other (See Comments)    Per [facility]  . Septra [Sulfamethoxazole-Trimethoprim] Other (See Comments)    Per [facility]      Medication List       Accurate as of 12/29/15  2:11 PM. Always use your most recent med list.          aspirin EC 81 MG tablet Take 81 mg by mouth daily.   b complex vitamins tablet Take 1 tablet  by mouth daily.   beta carotene w/minerals tablet Take 1 tablet by mouth daily.   OYSTER SHELL CALCIUM + D 500-200 MG-UNIT Tabs Generic drug:  Calcium Carb-Cholecalciferol Take 1 tablet by mouth daily.   CALCIUM 600 + D PO Take 1 tablet by mouth daily.   ciprofloxacin 250 MG tablet Commonly known as:  CIPRO Take one tablet twice daily for urinary infection   loperamide 2 MG capsule Commonly known as:  IMODIUM Take 2 mg by mouth as needed for diarrhea or loose stools.   sodium chloride 5 % ophthalmic ointment Commonly known as:  MURO 0000000 Place 1 application into the left eye daily.   SYSTANE ULTRA 0.4-0.3 % Soln Generic drug:  Polyethyl Glycol-Propyl Glycol Place 1 drop  into both eyes 3 (three) times daily.   SYSTANE 0.4-0.3 % Gel ophthalmic gel Generic drug:  Polyethyl Glycol-Propyl Glycol Place 1 application into both eyes at bedtime.   vitamin C 1000 MG tablet Take 1,000 mg by mouth daily.       Review of Systems:  Review of Systems  Constitutional: Negative.  Negative for activity change, appetite change, chills, diaphoresis, fatigue, fever and unexpected weight change.  HENT: Positive for hearing loss and voice change (hoarse). Negative for congestion, ear discharge, ear pain, postnasal drip, rhinorrhea, sore throat, tinnitus and trouble swallowing.   Eyes: Negative.  Negative for pain, redness, itching and visual disturbance.  Respiratory: Negative for cough, choking, chest tightness, shortness of breath and wheezing.   Cardiovascular: Negative for chest pain, palpitations and leg swelling.  Gastrointestinal: Positive for diarrhea. Negative for abdominal distention, abdominal pain, constipation and nausea.  Endocrine: Negative.  Negative for cold intolerance, heat intolerance, polydipsia, polyphagia and polyuria.  Genitourinary: Negative for difficulty urinating, dysuria, flank pain, frequency, hematuria, pelvic pain, urgency and vaginal discharge.       Incontinent of urine.  Musculoskeletal: Negative for arthralgias, back pain, gait problem, myalgias, neck pain and neck stiffness.       02/03/2015 ORIF of a closed displaced transverse fracture of right patella. Right leg splint. Left knee pain. Hx of tibial plateau fracture. Osteoarthritis of the fingers.  Skin: Negative.  Negative for color change, pallor and rash.  Allergic/Immunologic: Negative.   Neurological: Positive for dizziness. Negative for tremors, seizures, syncope, weakness, numbness and headaches.  Hematological: Negative.  Negative for adenopathy. Does not bruise/bleed easily.  Psychiatric/Behavioral: Negative for agitation, behavioral problems, confusion, dysphoric mood,  hallucinations, sleep disturbance and suicidal ideas. The patient is not nervous/anxious and is not hyperactive.     Health Maintenance  Topic Date Due  . ZOSTAVAX  05/29/1984  . DEXA SCAN  05/29/1989  . PNA vac Low Risk Adult (2 of 2 - PPSV23) 12/27/2010  . INFLUENZA VACCINE  11/21/2015  . TETANUS/TDAP  01/14/2025    Physical Exam: Vitals:   12/29/15 1353  BP: 132/70  Pulse: 66  Resp: 18  Temp: 98.8 F (37.1 C)  SpO2: 97%  Weight: 99 lb 3.2 oz (45 kg)  Height: 5\' 1"  (1.549 m)   Body mass index is 18.74 kg/m. Physical Exam  Constitutional: She is oriented to person, place, and time. She appears well-developed and well-nourished. No distress.  HENT:  Head: Normocephalic and atraumatic.  Right Ear: External ear normal.  Left Ear: External ear normal.  Nose: Nose normal.  Mouth/Throat: Oropharynx is clear and moist. No oropharyngeal exudate.  Bilateral hearing aids. Profound loss of hearing.  Eyes: Conjunctivae and EOM are normal. Pupils are equal, round, and  reactive to light. Right eye exhibits no discharge. Left eye exhibits no discharge. No scleral icterus.  Neck: Normal range of motion. Neck supple. No JVD present. No tracheal deviation present. No thyromegaly present.  Cardiovascular: Normal rate, regular rhythm, normal heart sounds and intact distal pulses.   No murmur heard. Pulmonary/Chest: Effort normal and breath sounds normal. No respiratory distress. She has no wheezes. She has no rales.  Abdominal: Soft. Bowel sounds are normal. She exhibits no distension. There is no tenderness.  Musculoskeletal: She exhibits edema and tenderness.  Left knee pain.  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 Tender SI joint areas bilaterally. Able to stand and sit unassisted.  Neurological: She is alert and oriented to person, place, and time. She displays normal reflexes. No cranial nerve deficit. She exhibits normal muscle tone. Coordination normal.    Skin: No rash noted. She is not diaphoretic. There is erythema. No pallor.  Brownish pigmented BLE  Psychiatric: She has a normal mood and affect. Her behavior is normal. Judgment and thought content normal.  Talks constantly and changes her subjects frequently.     Labs reviewed: Basic Metabolic Panel:  Recent Labs  01/03/15 0604 01/04/15 0555 02/03/15 1251 12/19/15  NA 134* 135 136 137  K 4.1 3.8 4.3 4.6  CL 103 105 101  --   CO2 24 23 25   --   GLUCOSE 90 86 92  --   BUN 22* 16 20 22*  CREATININE 0.92 0.73 0.73 0.8  CALCIUM 8.0* 7.7* 8.8*  --   TSH  --   --   --  4.03   Liver Function Tests:  Recent Labs  01/02/15 1547 01/04/15 0555 02/03/15 1251 12/19/15  AST 35 32 27 21  ALT 28 36 19 17  ALKPHOS 80 79 64 54  BILITOT 0.6 0.5 1.0  --   PROT 6.2* 4.7* 6.0*  --   ALBUMIN 3.3* 2.2* 3.6  --    No results for input(s): LIPASE, AMYLASE in the last 8760 hours. No results for input(s): AMMONIA in the last 8760 hours. CBC:  Recent Labs  01/01/15 1554  01/02/15 1547 01/04/15 0555 02/03/15 1251 12/19/15  WBC 14.4*  --  14.7* 9.3 7.3 6.2  NEUTROABS 11.8*  --  12.4*  --  5.0  --   HGB 13.5  < > 13.0 11.5* 13.1 13.9  HCT 39.3  < > 36.4 34.1* 40.0 41  MCV 89.9  --  88.6 90.7 91.5  --   PLT 172  --  186 173 231 211  < > = values in this interval not displayed. Lipid Panel: No results for input(s): CHOL, HDL, LDLCALC, TRIG, CHOLHDL, LDLDIRECT in the last 8760 hours. Lab Results  Component Value Date   HGBA1C 6.0 06/14/2014    Procedures since last visit: No results found.  Assessment/Plan Recurrent UTI 01/26/15 urine culture, E Coli, complete 7 day course of Nitrofurantoin 100mg  bid started 01/30/15, repeat UA c/s upon completion of ABT 12/19/15 Urine culture 100,000 E-coli, Macrobid 100mg  #20 one twice daily for UTI for 10 days.  12/29/15 dc'd NItrofurantoin due to nausea and leg pain,  hearing music, better nausea with Cipro 250mg  bid x 7 days. C/o sore right  eye   Irritation of right eye No redness or drainage, denied vision change.  the patient stated may be her contact lens may have caused the issue, will leave it out till it resolves. Recommended Systane prn and observe   GERD (  gastroesophageal reflux disease) Stable, no taking acid reducer.    Dizziness and giddiness On and off, unsteady gait, no change, carotid artery [place], CT head. CBC CMP 12/19/15 unremarkable.     Labs/tests ordered: CT head. UA C/S, carotid artery [place] R+L pending  Next appt:  02/01/2016

## 2015-12-29 NOTE — Assessment & Plan Note (Addendum)
On and off, unsteady gait, no change, carotid artery [place], CT head. CBC CMP 12/19/15 unremarkable.

## 2015-12-29 NOTE — Assessment & Plan Note (Signed)
01/26/15 urine culture, E Coli, complete 7 day course of Nitrofurantoin 100mg  bid started 01/30/15, repeat UA c/s upon completion of ABT 12/19/15 Urine culture 100,000 E-coli, Macrobid 100mg  #20 one twice daily for UTI for 10 days.  12/29/15 dc'd NItrofurantoin due to nausea and leg pain,  hearing music, better nausea with Cipro 250mg  bid x 7 days. C/o sore right eye

## 2015-12-29 NOTE — Assessment & Plan Note (Signed)
Stable, no taking acid reducer 

## 2015-12-29 NOTE — Progress Notes (Signed)
Location: [place] Room Number: [id number] of Service:  SNF (31) Provider:  Mast, Manxie  NP  Jeanmarie Hubert, MD  Patient Care Team: Estill Dooms, MD as PCP - General (Internal Medicine) Dorna Leitz, MD as Consulting Physician (Orthopedic Surgery) Carol Ada, MD as Consulting Physician (Gastroenterology) Crista Luria, MD as Consulting Physician (Dermatology) [facility] as Audiologist (Audiology) Man Otho Darner, NP as Nurse Practitioner (Nurse Practitioner) Tanda Rockers, MD as Consulting Physician (Pulmonary Disease)  Extended Emergency Contact Information Primary Emergency Contact: Aron Baba [place] of [place] Phone: [phone] Relation: Friend Secondary Emergency Contact: Aron Baba [place] of [place] Phone: [phone] Relation: Friend  Code Status:  DNR Goals of care: Advanced Directive information Advanced Directives 12/29/2015  Does patient have an advance directive? Yes  Type of Advance Directive Living will;Out of facility DNR (pink MOST or yellow form)  Does patient want to make changes to advanced directive? No - Patient declined  Copy of advanced directive(s) in chart? Yes  Pre-existing out of facility DNR order (yellow form or pink MOST form) -     Chief Complaint  Patient presents with  . Acute Visit    more confused, gait issues, not feeling well    HPI:  Pt is a 82 y.o. female seen today for an acute visit for    Past Medical History:  Diagnosis Date  . Allergy   . Arthritis   . Cancer (HCC)    squamous cell  . Female stress incontinence   . Frequent loose stools   . GERD (gastroesophageal reflux disease)   . Incontinence of urine   . Insomnia   . Oxygen deficiency   . Sepsis due to Escherichia coli (E. coli) ([place])   . Squamous cell carcinoma of skin   . Stroke [facility])    TIA  . TIA (transient ischemic attack) 01/05/13   Past Surgical History:  Procedure  Laterality Date  . ABDOMINAL HYSTERECTOMY  1959  . APPENDECTOMY  1959  . BREAST SURGERY  1982   breast  . COLONOSCOPY  2013   Dr. Carol Ada  . ORIF PATELLA Right 02/03/2015   Procedure: OPEN REDUCTION INTERNAL (ORIF) FIXATION RIGHT PATELLA;  Surgeon: Dorna Leitz, MD;  Location: [place];  Service: Orthopedics;  Laterality: Right;  . TEE WITHOUT CARDIOVERSION N/A 01/18/2013   Procedure: TRANSESOPHAGEAL ECHOCARDIOGRAM (TEE);  Surgeon: Lelon Perla, MD;  Location: [facility] ENDOSCOPY;  Service: Cardiovascular;  Laterality: N/A;  . TONSILLECTOMY  1940    Allergies  Allergen Reactions  . Macrobid [Nitrofurantoin Monohyd Macro] Nausea Only  . Penicillin G Itching    Has patient had a PCN reaction causing immediate rash, facial/tongue/throat swelling, SOB or lightheadedness with hypotension: No Has patient had a PCN reaction causing severe rash involving mucus membranes or skin necrosis: No Has patient had a PCN reaction that required hospitalization No Has patient had a PCN reaction occurring within the last 10 years: No If all of the above answers are "NO", then may proceed with Cephalosporin use.  Sarina Ill [Bactrim]     Stomach concerns.   . Penicillins Other (See Comments)    Per MAR  . Septra [Sulfamethoxazole-Trimethoprim] Other (See Comments)    Per [facility]      Medication List       Accurate as of 12/29/15  1:59 PM. Always use your most recent med list.  aspirin EC 81 MG tablet Take 81 mg by mouth daily.   b complex vitamins tablet Take 1 tablet by mouth daily.   beta carotene w/minerals tablet Take 1 tablet by mouth daily.   OYSTER SHELL CALCIUM + D 500-200 MG-UNIT Tabs Generic drug:  Calcium Carb-Cholecalciferol Take 1 tablet by mouth daily.   CALCIUM 600 + D PO Take 1 tablet by mouth daily.   ciprofloxacin 250 MG tablet Commonly known as:  CIPRO Take one tablet twice daily for urinary infection   loperamide 2 MG capsule Commonly known as:  IMODIUM Take  2 mg by mouth as needed for diarrhea or loose stools.   sodium chloride 5 % ophthalmic ointment Commonly known as:  MURO 0000000 Place 1 application into the left eye daily.   SYSTANE ULTRA 0.4-0.3 % Soln Generic drug:  Polyethyl Glycol-Propyl Glycol Place 1 drop into both eyes 3 (three) times daily.   SYSTANE 0.4-0.3 % Gel ophthalmic gel Generic drug:  Polyethyl Glycol-Propyl Glycol Place 1 application into both eyes at bedtime.   vitamin C 1000 MG tablet Take 1,000 mg by mouth daily.       Review of Systems  Immunization History  Administered Date(s) Administered  . Influenza Split 02/18/2011, 01/03/2012, 01/20/2014  . Influenza,inj,Quad PF,36+ Mos 12/30/2012  . Influenza-Unspecified 01/19/2015  . Pneumococcal Conjugate-13 12/26/2009  . Tdap 04/27/2006, 01/15/2015   Pertinent  Health Maintenance Due  Topic Date Due  . DEXA SCAN  05/29/1989  . PNA vac Low Risk Adult (2 of 2 - PPSV23) 12/27/2010  . INFLUENZA VACCINE  11/21/2015   Fall Risk  08/17/2015 02/09/2015 09/24/2014 03/24/2014 01/20/2014  Falls in the past year? Yes Yes No No Yes  Number falls in past yr: 1 2 or more - - 1  Injury with Fall? No Yes - - -  Risk for fall due to : - History of fall(s) - - -   Functional Status Survey:    Vitals:   12/29/15 1353  BP: 132/70  Pulse: 66  Resp: 18  Temp: 98.8 F (37.1 C)  SpO2: 97%  Weight: 99 lb 3.2 oz (45 kg)  Height: 5\' 1"  (1.549 m)   Body mass index is 18.74 kg/m. Physical Exam  Labs reviewed:  Recent Labs  01/03/15 0604 01/04/15 0555 02/03/15 1251 12/19/15  NA 134* 135 136 137  K 4.1 3.8 4.3 4.6  CL 103 105 101  --   CO2 24 23 25   --   GLUCOSE 90 86 92  --   BUN 22* 16 20 22*  CREATININE 0.92 0.73 0.73 0.8  CALCIUM 8.0* 7.7* 8.8*  --     Recent Labs  01/02/15 1547 01/04/15 0555 02/03/15 1251 12/19/15  AST 35 32 27 21  ALT 28 36 19 17  ALKPHOS 80 79 64 54  BILITOT 0.6 0.5 1.0  --   PROT 6.2* 4.7* 6.0*  --   ALBUMIN 3.3* 2.2* 3.6  --       Recent Labs  01/01/15 1554  01/02/15 1547 01/04/15 0555 02/03/15 1251 12/19/15  WBC 14.4*  --  14.7* 9.3 7.3 6.2  NEUTROABS 11.8*  --  12.4*  --  5.0  --   HGB 13.5  < > 13.0 11.5* 13.1 13.9  HCT 39.3  < > 36.4 34.1* 40.0 41  MCV 89.9  --  88.6 90.7 91.5  --   PLT 172  --  186 173 231 211  < > = values in this interval  not displayed. Lab Results  Component Value Date   TSH 4.03 12/19/2015   Lab Results  Component Value Date   HGBA1C 6.0 06/14/2014   Lab Results  Component Value Date   CHOL 174 06/14/2014   HDL 96 (A) 06/14/2014   LDLCALC 64 06/14/2014   TRIG 69 06/14/2014   CHOLHDL 1.4 01/13/2013    Significant Diagnostic Results in last 30 days:  No results found.  Assessment/Plan There are no diagnoses linked to this encounter.   Family/ staff Communication:   Labs/tests ordered:

## 2015-12-29 NOTE — Assessment & Plan Note (Signed)
No redness or drainage, denied vision change.  the patient stated may be her contact lens may have caused the issue, will leave it out till it resolves. Recommended Systane prn and observe

## 2016-01-01 NOTE — Progress Notes (Signed)
Location:   [place] Room Number: [id number] of Service:  Clinic (12) Provider: Marlana Latus NP  Code Status: DNR Goals of Care: [place] Advanced Directives 12/29/2015  Does patient have an advance directive? Yes  Type of Advance Directive Living will;Out of facility DNR (pink MOST or yellow form)  Does patient want to make changes to advanced directive? No - Patient declined  Copy of advanced directive(s) in chart? Yes  Pre-existing out of facility DNR order (yellow form or pink MOST form) -     Chief Complaint  Patient presents with  . Acute Visit    more confused, gait issues, not feeling well    HPI: Patient is a 82 y.o. female seen today for UTI, right eye scratchy sensation.   01/26/15 urine culture, E Coli, complete 7 day course of Nitrofurantoin 100mg  bid started 01/30/15, repeat UA c/s upon completion of ABT 12/19/15 Urine culture 100,000 E-coli, Macrobid 100mg  #20 one twice daily for UTI for 10 days.   12/29/15 dc'd NItrofurantoin due to nausea and leg pain,  hearing music, better nausea with Cipro 250mg  bid x 7 days. C/o sore right eye   Hx of TIA, GERD, carotid arthrosclerosis, lower back pain, urine frequency, unsteady gait-pending Carotid artery, CT head  Past Medical History:  Diagnosis Date  . Allergy   . Arthritis   . Cancer (HCC)    squamous cell  . Female stress incontinence   . Frequent loose stools   . GERD (gastroesophageal reflux disease)   . Incontinence of urine   . Insomnia   . Oxygen deficiency   . Sepsis due to Escherichia coli (E. coli) ([place])   . Squamous cell carcinoma of skin   . Stroke [facility])    TIA  . TIA (transient ischemic attack) 01/05/13    Past Surgical History:  Procedure Laterality Date  . ABDOMINAL HYSTERECTOMY  1959  . APPENDECTOMY  1959  . BREAST SURGERY  1982   breast  . COLONOSCOPY  2013   Dr. Carol Ada  . ORIF PATELLA Right 02/03/2015   Procedure: OPEN REDUCTION INTERNAL (ORIF) FIXATION RIGHT PATELLA;  Surgeon:  Dorna Leitz, MD;  Location: [place];  Service: Orthopedics;  Laterality: Right;  . TEE WITHOUT CARDIOVERSION N/A 01/18/2013   Procedure: TRANSESOPHAGEAL ECHOCARDIOGRAM (TEE);  Surgeon: Lelon Perla, MD;  Location: [facility] ENDOSCOPY;  Service: Cardiovascular;  Laterality: N/A;  . TONSILLECTOMY  1940    Allergies  Allergen Reactions  . Macrobid [Nitrofurantoin Monohyd Macro] Nausea Only  . Penicillin G Itching    Has patient had a PCN reaction causing immediate rash, facial/tongue/throat swelling, SOB or lightheadedness with hypotension: No Has patient had a PCN reaction causing severe rash involving mucus membranes or skin necrosis: No Has patient had a PCN reaction that required hospitalization No Has patient had a PCN reaction occurring within the last 10 years: No If all of the above answers are "NO", then may proceed with Cephalosporin use.  Sarina Ill [Bactrim]     Stomach concerns.   . Penicillins Other (See Comments)    Per MAR  . Septra [Sulfamethoxazole-Trimethoprim] Other (See Comments)    Per [facility]      Medication List       Accurate as of 12/29/15 11:59 PM. Always use your most recent med list.          aspirin EC 81 MG tablet Take 81 mg by mouth daily.   b complex vitamins tablet Take 1 tablet by  mouth daily.   beta carotene w/minerals tablet Take 1 tablet by mouth daily.   OYSTER SHELL CALCIUM + D 500-200 MG-UNIT Tabs Generic drug:  Calcium Carb-Cholecalciferol Take 1 tablet by mouth daily.   CALCIUM 600 + D PO Take 1 tablet by mouth daily.   ciprofloxacin 250 MG tablet Commonly known as:  CIPRO Take one tablet twice daily for urinary infection   loperamide 2 MG capsule Commonly known as:  IMODIUM Take 2 mg by mouth as needed for diarrhea or loose stools.   sodium chloride 5 % ophthalmic ointment Commonly known as:  MURO 0000000 Place 1 application into the left eye daily.   SYSTANE ULTRA 0.4-0.3 % Soln Generic drug:  Polyethyl Glycol-Propyl Glycol Place 1  drop into both eyes 3 (three) times daily.   SYSTANE 0.4-0.3 % Gel ophthalmic gel Generic drug:  Polyethyl Glycol-Propyl Glycol Place 1 application into both eyes at bedtime.   vitamin C 1000 MG tablet Take 1,000 mg by mouth daily.       Review of Systems:  Review of Systems  Constitutional: Negative.  Negative for activity change, appetite change, chills, diaphoresis, fatigue, fever and unexpected weight change.  HENT: Positive for hearing loss and voice change (hoarse). Negative for congestion, ear discharge, ear pain, postnasal drip, rhinorrhea, sore throat, tinnitus and trouble swallowing.   Eyes: Negative.  Negative for pain, redness, itching and visual disturbance.  Respiratory: Negative for cough, choking, chest tightness, shortness of breath and wheezing.   Cardiovascular: Negative for chest pain, palpitations and leg swelling.  Gastrointestinal: Positive for diarrhea. Negative for abdominal distention, abdominal pain, constipation and nausea.  Endocrine: Negative.  Negative for cold intolerance, heat intolerance, polydipsia, polyphagia and polyuria.  Genitourinary: Negative for difficulty urinating, dysuria, flank pain, frequency, hematuria, pelvic pain, urgency and vaginal discharge.       Incontinent of urine.  Musculoskeletal: Negative for arthralgias, back pain, gait problem, myalgias, neck pain and neck stiffness.       02/03/2015 ORIF of a closed displaced transverse fracture of right patella. Right leg splint. Left knee pain. Hx of tibial plateau fracture. Osteoarthritis of the fingers.  Skin: Negative.  Negative for color change, pallor and rash.  Allergic/Immunologic: Negative.   Neurological: Positive for dizziness. Negative for tremors, seizures, syncope, weakness, numbness and headaches.  Hematological: Negative.  Negative for adenopathy. Does not bruise/bleed easily.  Psychiatric/Behavioral: Negative for agitation, behavioral problems, confusion, dysphoric mood,  hallucinations, sleep disturbance and suicidal ideas. The patient is not nervous/anxious and is not hyperactive.     Health Maintenance  Topic Date Due  . ZOSTAVAX  05/29/1984  . DEXA SCAN  05/29/1989  . PNA vac Low Risk Adult (2 of 2 - PPSV23) 12/27/2010  . INFLUENZA VACCINE  11/21/2015  . TETANUS/TDAP  01/14/2025    Physical Exam: Vitals:   12/29/15 1353  BP: 132/70  Pulse: 66  Resp: 18  Temp: 98.8 F (37.1 C)  SpO2: 97%  Weight: 99 lb 3.2 oz (45 kg)  Height: 5\' 1"  (1.549 m)   Body mass index is 18.74 kg/m. Physical Exam  Constitutional: She is oriented to person, place, and time. She appears well-developed and well-nourished. No distress.  HENT:  Head: Normocephalic and atraumatic.  Right Ear: External ear normal.  Left Ear: External ear normal.  Nose: Nose normal.  Mouth/Throat: Oropharynx is clear and moist. No oropharyngeal exudate.  Bilateral hearing aids. Profound loss of hearing.  Eyes: Conjunctivae and EOM are normal. Pupils are equal, round, and reactive  to light. Right eye exhibits no discharge. Left eye exhibits no discharge. No scleral icterus.  Neck: Normal range of motion. Neck supple. No JVD present. No tracheal deviation present. No thyromegaly present.  Cardiovascular: Normal rate, regular rhythm, normal heart sounds and intact distal pulses.   No murmur heard. Pulmonary/Chest: Effort normal and breath sounds normal. No respiratory distress. She has no wheezes. She has no rales.  Abdominal: Soft. Bowel sounds are normal. She exhibits no distension. There is no tenderness.  Musculoskeletal: She exhibits edema and tenderness.  Left knee pain.  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 Tender SI joint areas bilaterally. Able to stand and sit unassisted.  Neurological: She is alert and oriented to person, place, and time. She displays normal reflexes. No cranial nerve deficit. She exhibits normal muscle tone. Coordination normal.    Skin: No rash noted. She is not diaphoretic. There is erythema. No pallor.  Brownish pigmented BLE  Psychiatric: She has a normal mood and affect. Her behavior is normal. Judgment and thought content normal.  Talks constantly and changes her subjects frequently.     Labs reviewed: Basic Metabolic Panel:  Recent Labs  01/03/15 0604 01/04/15 0555 02/03/15 1251 12/19/15  NA 134* 135 136 137  K 4.1 3.8 4.3 4.6  CL 103 105 101  --   CO2 24 23 25   --   GLUCOSE 90 86 92  --   BUN 22* 16 20 22*  CREATININE 0.92 0.73 0.73 0.8  CALCIUM 8.0* 7.7* 8.8*  --   TSH  --   --   --  4.03   Liver Function Tests:  Recent Labs  01/02/15 1547 01/04/15 0555 02/03/15 1251 12/19/15  AST 35 32 27 21  ALT 28 36 19 17  ALKPHOS 80 79 64 54  BILITOT 0.6 0.5 1.0  --   PROT 6.2* 4.7* 6.0*  --   ALBUMIN 3.3* 2.2* 3.6  --    No results for input(s): LIPASE, AMYLASE in the last 8760 hours. No results for input(s): AMMONIA in the last 8760 hours. CBC:  Recent Labs  01/02/15 1547 01/04/15 0555 02/03/15 1251 12/19/15  WBC 14.7* 9.3 7.3 6.2  NEUTROABS 12.4*  --  5.0  --   HGB 13.0 11.5* 13.1 13.9  HCT 36.4 34.1* 40.0 41  MCV 88.6 90.7 91.5  --   PLT 186 173 231 211   Lipid Panel: No results for input(s): CHOL, HDL, LDLCALC, TRIG, CHOLHDL, LDLDIRECT in the last 8760 hours. Lab Results  Component Value Date   HGBA1C 6.0 06/14/2014    Procedures since last visit: No results found.  Assessment/Plan Recurrent UTI 01/26/15 urine culture, E Coli, complete 7 day course of Nitrofurantoin 100mg  bid started 01/30/15, repeat UA c/s upon completion of ABT 12/19/15 Urine culture 100,000 E-coli, Macrobid 100mg  #20 one twice daily for UTI for 10 days.  12/29/15 dc'd NItrofurantoin due to nausea and leg pain,  hearing music, better nausea with Cipro 250mg  bid x 7 days. C/o sore right eye   Irritation of right eye No redness or drainage, denied vision change.  the patient stated may be her contact lens  may have caused the issue, will leave it out till it resolves. Recommended Systane prn and observe   GERD (gastroesophageal reflux disease) Stable, no taking acid reducer.    Dizziness and giddiness On and off, unsteady gait, no change, carotid artery [place], CT head. CBC CMP 12/19/15 unremarkable.     Labs/tests ordered:  CT head. UA C/S, carotid artery [place] R+L pending  Next appt:  02/01/2016

## 2016-01-03 ENCOUNTER — Ambulatory Visit (HOSPITAL_BASED_OUTPATIENT_CLINIC_OR_DEPARTMENT_OTHER)
Admission: RE | Admit: 2016-01-03 | Discharge: 2016-01-03 | Disposition: A | Discharge status: Home / Self Care | Payer: Medicare Other | Source: Ambulatory Visit | Attending: Nurse Practitioner | Admitting: Nurse Practitioner

## 2016-01-03 ENCOUNTER — Encounter (HOSPITAL_COMMUNITY): Payer: Self-pay

## 2016-01-03 ENCOUNTER — Ambulatory Visit (HOSPITAL_COMMUNITY)
Admission: RE | Admit: 2016-01-03 | Discharge: 2016-01-03 | Disposition: A | Discharge status: Home / Self Care | Payer: Medicare Other | Source: Ambulatory Visit | Attending: Nurse Practitioner | Admitting: Nurse Practitioner

## 2016-01-03 DIAGNOSIS — R42 Dizziness and giddiness: Secondary | ICD-10-CM | POA: Insufficient documentation

## 2016-01-03 DIAGNOSIS — I6782 Cerebral ischemia: Secondary | ICD-10-CM | POA: Insufficient documentation

## 2016-01-03 DIAGNOSIS — G319 Degenerative disease of nervous system, unspecified: Secondary | ICD-10-CM | POA: Diagnosis not present

## 2016-01-03 LAB — VAS US CAROTID
LCCADDIAS: 9 cm/s
LCCADSYS: 63 cm/s
LEFT ECA DIAS: -9 cm/s
LEFT VERTEBRAL DIAS: 8 cm/s
LICADDIAS: -20 cm/s
LICADSYS: -90 cm/s
LICAPSYS: 60 cm/s
Left CCA prox dias: -8 cm/s
Left CCA prox sys: -64 cm/s
Left ICA prox dias: 11 cm/s
RCCAPSYS: -67 cm/s
RIGHT ECA DIAS: -5 cm/s
RIGHT VERTEBRAL DIAS: 18 cm/s
Right CCA prox dias: -7 cm/s
Right cca dist sys: 107 cm/s

## 2016-01-03 NOTE — Progress Notes (Signed)
*  PRELIMINARY RESULTS* Vascular Ultrasound Carotid Duplex (Doppler) has been completed.  Preliminary findings: Bilateral: No significant (1-39%) ICA stenosis. Antegrade vertebral flow.    Landry Mellow, RDMS, RVT  01/03/2016, 11:15 AM

## 2016-01-04 ENCOUNTER — Encounter: Payer: Self-pay | Admitting: Internal Medicine

## 2016-01-04 DIAGNOSIS — R300 Dysuria: Secondary | ICD-10-CM | POA: Diagnosis not present

## 2016-01-09 ENCOUNTER — Other Ambulatory Visit: Payer: Self-pay | Admitting: *Deleted

## 2016-01-09 DIAGNOSIS — N390 Urinary tract infection, site not specified: Secondary | ICD-10-CM

## 2016-01-10 DIAGNOSIS — Z8673 Personal history of transient ischemic attack (TIA), and cerebral infarction without residual deficits: Secondary | ICD-10-CM | POA: Diagnosis not present

## 2016-01-10 DIAGNOSIS — M1990 Unspecified osteoarthritis, unspecified site: Secondary | ICD-10-CM | POA: Diagnosis not present

## 2016-01-10 DIAGNOSIS — N390 Urinary tract infection, site not specified: Secondary | ICD-10-CM | POA: Diagnosis not present

## 2016-01-10 DIAGNOSIS — I2510 Atherosclerotic heart disease of native coronary artery without angina pectoris: Secondary | ICD-10-CM | POA: Diagnosis not present

## 2016-01-10 DIAGNOSIS — Z85828 Personal history of other malignant neoplasm of skin: Secondary | ICD-10-CM | POA: Diagnosis not present

## 2016-01-10 DIAGNOSIS — M545 Low back pain: Secondary | ICD-10-CM | POA: Diagnosis not present

## 2016-01-10 DIAGNOSIS — B9620 Unspecified Escherichia coli [E. coli] as the cause of diseases classified elsewhere: Secondary | ICD-10-CM | POA: Diagnosis not present

## 2016-01-11 ENCOUNTER — Encounter: Payer: Medicare Other | Admitting: Nurse Practitioner

## 2016-01-11 DIAGNOSIS — N390 Urinary tract infection, site not specified: Secondary | ICD-10-CM | POA: Diagnosis not present

## 2016-01-11 DIAGNOSIS — B9620 Unspecified Escherichia coli [E. coli] as the cause of diseases classified elsewhere: Secondary | ICD-10-CM | POA: Diagnosis not present

## 2016-01-12 DIAGNOSIS — N390 Urinary tract infection, site not specified: Secondary | ICD-10-CM | POA: Diagnosis not present

## 2016-01-12 DIAGNOSIS — B9620 Unspecified Escherichia coli [E. coli] as the cause of diseases classified elsewhere: Secondary | ICD-10-CM | POA: Diagnosis not present

## 2016-01-13 DIAGNOSIS — N390 Urinary tract infection, site not specified: Secondary | ICD-10-CM | POA: Diagnosis not present

## 2016-01-13 DIAGNOSIS — B9620 Unspecified Escherichia coli [E. coli] as the cause of diseases classified elsewhere: Secondary | ICD-10-CM | POA: Diagnosis not present

## 2016-01-14 DIAGNOSIS — B9620 Unspecified Escherichia coli [E. coli] as the cause of diseases classified elsewhere: Secondary | ICD-10-CM | POA: Diagnosis not present

## 2016-01-14 DIAGNOSIS — N390 Urinary tract infection, site not specified: Secondary | ICD-10-CM | POA: Diagnosis not present

## 2016-01-15 DIAGNOSIS — N390 Urinary tract infection, site not specified: Secondary | ICD-10-CM | POA: Diagnosis not present

## 2016-01-15 DIAGNOSIS — B9620 Unspecified Escherichia coli [E. coli] as the cause of diseases classified elsewhere: Secondary | ICD-10-CM | POA: Diagnosis not present

## 2016-01-16 DIAGNOSIS — B9620 Unspecified Escherichia coli [E. coli] as the cause of diseases classified elsewhere: Secondary | ICD-10-CM | POA: Diagnosis not present

## 2016-01-16 DIAGNOSIS — N390 Urinary tract infection, site not specified: Secondary | ICD-10-CM | POA: Diagnosis not present

## 2016-01-18 ENCOUNTER — Encounter: Payer: Self-pay | Admitting: Internal Medicine

## 2016-01-18 DIAGNOSIS — R300 Dysuria: Secondary | ICD-10-CM | POA: Diagnosis not present

## 2016-01-24 DIAGNOSIS — Z23 Encounter for immunization: Secondary | ICD-10-CM | POA: Diagnosis not present

## 2016-01-24 DIAGNOSIS — D485 Neoplasm of uncertain behavior of skin: Secondary | ICD-10-CM | POA: Diagnosis not present

## 2016-01-24 DIAGNOSIS — Z85828 Personal history of other malignant neoplasm of skin: Secondary | ICD-10-CM | POA: Diagnosis not present

## 2016-01-24 DIAGNOSIS — L570 Actinic keratosis: Secondary | ICD-10-CM | POA: Diagnosis not present

## 2016-01-24 DIAGNOSIS — L814 Other melanin hyperpigmentation: Secondary | ICD-10-CM | POA: Diagnosis not present

## 2016-01-24 DIAGNOSIS — L821 Other seborrheic keratosis: Secondary | ICD-10-CM | POA: Diagnosis not present

## 2016-01-24 DIAGNOSIS — D1801 Hemangioma of skin and subcutaneous tissue: Secondary | ICD-10-CM | POA: Diagnosis not present

## 2016-01-25 DIAGNOSIS — D045 Carcinoma in situ of skin of trunk: Secondary | ICD-10-CM | POA: Diagnosis not present

## 2016-02-01 ENCOUNTER — Encounter: Payer: Self-pay | Admitting: Internal Medicine

## 2016-02-01 ENCOUNTER — Non-Acute Institutional Stay: Payer: Medicare Other | Admitting: Internal Medicine

## 2016-02-01 VITALS — BP 132/64 | HR 67 | Temp 98.1°F | Ht 61.0 in | Wt 99.0 lb

## 2016-02-01 DIAGNOSIS — K219 Gastro-esophageal reflux disease without esophagitis: Secondary | ICD-10-CM

## 2016-02-01 DIAGNOSIS — N390 Urinary tract infection, site not specified: Secondary | ICD-10-CM | POA: Diagnosis not present

## 2016-02-01 DIAGNOSIS — R739 Hyperglycemia, unspecified: Secondary | ICD-10-CM

## 2016-02-01 DIAGNOSIS — Z0000 Encounter for general adult medical examination without abnormal findings: Secondary | ICD-10-CM | POA: Diagnosis not present

## 2016-02-01 DIAGNOSIS — Z23 Encounter for immunization: Secondary | ICD-10-CM | POA: Diagnosis not present

## 2016-02-01 NOTE — Progress Notes (Signed)
Facility  [facility]    Place of Service: Clinic (12)     Allergies  Allergen Reactions  . Macrobid [Nitrofurantoin Monohyd Macro] Nausea Only  . Penicillin G Itching    Has patient had a PCN reaction causing immediate rash, facial/tongue/throat swelling, SOB or lightheadedness with hypotension: No Has patient had a PCN reaction causing severe rash involving mucus membranes or skin necrosis: No Has patient had a PCN reaction that required hospitalization No Has patient had a PCN reaction occurring within the last 10 years: No If all of the above answers are "NO", then may proceed with Cephalosporin use.  [name] [Bactrim]     Stomach concerns.   . Penicillins Other (See Comments)    Per [facility]  . Septra [Sulfamethoxazole-Trimethoprim] Other (See Comments)    Per [facility]    Chief Complaint  Patient presents with  . Annual Exam    Wellness exam  . Medical Management of Chronic Issues    medication management TIA, GERD. lab done 12/19/15    HPI:  Gastroesophageal reflux disease without esophagitis - asymptomatic  Hyperglycemia - stable  Recurrent UTI - has nocturia and urgency. Hx hospitalization in 2016 with UTI. She would like urine checked from time to time.   Medications: Patient's Medications  New Prescriptions   No medications on file  Previous Medications   ASCORBIC ACID (VITAMIN C) 1000 MG TABLET    Take 1,000 mg by mouth daily.   ASPIRIN EC 81 MG TABLET    Take 81 mg by mouth daily.    B COMPLEX VITAMINS TABLET    Take 1 tablet by mouth daily.   BETA CAROTENE W/MINERALS (OCUVITE) TABLET    Take 1 tablet by mouth daily.   CALCIUM CARB-CHOLECALCIFEROL (OYSTER SHELL CALCIUM + D) 500-200 MG-UNIT TABS    Take 1 tablet by mouth daily.   CALCIUM CARBONATE-VITAMIN D (CALCIUM 600 + D PO)    Take 1 tablet by mouth daily.   LOPERAMIDE (IMODIUM) 2 MG CAPSULE    Take 2 mg by mouth as needed for diarrhea or loose stools.   POLYETHYL GLYCOL-PROPYL GLYCOL (SYSTANE ULTRA) 0.4-0.3 %  SOLN    Place 1 drop into both eyes 3 (three) times daily.   POLYETHYL GLYCOL-PROPYL GLYCOL (SYSTANE) 0.4-0.3 % GEL OPHTHALMIC GEL    Place 1 application into both eyes at bedtime.  Modified Medications   No medications on file  Discontinued Medications   CIPROFLOXACIN (CIPRO) 250 MG TABLET    Take one tablet twice daily for urinary infection   SODIUM CHLORIDE (MURO 128) 5 % OPHTHALMIC OINTMENT    Place 1 application into the left eye daily.     Review of Systems  Constitutional: Negative.  Negative for activity change, appetite change, chills, diaphoresis, fatigue, fever and unexpected weight change.  HENT: Positive for hearing loss and voice change (hoarse). Negative for congestion, ear discharge, ear pain, postnasal drip, rhinorrhea, sore throat, tinnitus and trouble swallowing.   Eyes: Negative.  Negative for pain, redness, itching and visual disturbance.  Respiratory: Negative for cough, choking, chest tightness, shortness of breath and wheezing.   Cardiovascular: Negative for chest pain, palpitations and leg swelling.  Gastrointestinal: Positive for diarrhea. Negative for abdominal distention, abdominal pain, constipation and nausea.  Endocrine: Negative.  Negative for cold intolerance, heat intolerance, polydipsia, polyphagia and polyuria.  Genitourinary: Positive for urgency. Negative for difficulty urinating, dysuria, flank pain, frequency, hematuria, pelvic pain and vaginal discharge.       Incontinent of  urine. Nocuria x 4.  Musculoskeletal: Negative for arthralgias, back pain, gait problem, myalgias, neck pain and neck stiffness.       02/03/2015 ORIF of a closed displaced transverse fracture of right patella. Right leg splint. Left knee pain. Hx of tibial plateau fracture. Osteoarthritis of the fingers.  Skin: Negative.  Negative for color change, pallor and rash.  Allergic/Immunologic: Negative.   Neurological: Negative for dizziness, tremors, seizures, syncope, weakness,  numbness and headaches.  Hematological: Negative.  Negative for adenopathy. Does not bruise/bleed easily.  Psychiatric/Behavioral: Negative for agitation, behavioral problems, confusion, dysphoric mood, hallucinations, sleep disturbance and suicidal ideas. The patient is not nervous/anxious and is not hyperactive.     Vitals:   02/01/16 1522  BP: 132/64  Pulse: 67  Temp: 98.1 F (36.7 C)  TempSrc: Oral  SpO2: 94%  Weight: 99 lb (44.9 kg)  Height: 5' 1"  (1.549 m)   Wt Readings from Last 3 Encounters:  02/01/16 99 lb (44.9 kg)  12/29/15 99 lb 3.2 oz (45 kg)  12/14/15 99 lb 3.2 oz (45 kg)    Body mass index is 18.71 kg/m.  Physical Exam  Constitutional: She is oriented to person, place, and time. She appears well-developed and well-nourished. No distress.  HENT:  Head: Normocephalic and atraumatic.  Right Ear: External ear normal.  Left Ear: External ear normal.  Nose: Nose normal.  Mouth/Throat: Oropharynx is clear and moist. No oropharyngeal exudate.  Bilateral hearing aids. Profound loss of hearing.  Eyes: Conjunctivae and EOM are normal. Pupils are equal, round, and reactive to light. Right eye exhibits no discharge. Left eye exhibits no discharge. No scleral icterus.  Neck: Normal range of motion. Neck supple. No JVD present. No tracheal deviation present. No thyromegaly present.  Cardiovascular: Normal rate, regular rhythm, normal heart sounds and intact distal pulses.   No murmur heard. Pulmonary/Chest: Effort normal and breath sounds normal. No respiratory distress. She has no wheezes. She has no rales.  Abdominal: Soft. Bowel sounds are normal. She exhibits no distension. There is no tenderness.  Musculoskeletal: She exhibits edema and tenderness.  Left knee pain.  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 Tender SI joint areas bilaterally. Able to stand and sit unassisted.  Neurological: She is alert and oriented to person, place, and time. She  displays normal reflexes. No cranial nerve deficit. She exhibits normal muscle tone. Coordination normal.  02/01/16 MMSE 28/30. Failed clock drawing.  Skin: No rash noted. She is not diaphoretic. There is erythema. No pallor.  Brownish pigmented BLE. Skin terar of the left anterior lower leg. Healing. SCC at manubrium.  Psychiatric: She has a normal mood and affect. Her behavior is normal. Judgment and thought content normal.  Talks constantly and changes her subjects frequently.      Labs reviewed: Lab Summary Latest Ref Rng & Units 12/19/2015 02/03/2015  Hemoglobin 12.0 - 16.0 g/dL 13.9 13.1  Hematocrit 36 - 46 % 41 40.0  White count 10:3/mL 6.2 7.3  Platelet count 150 - 399 K/L 211 231  Sodium 137 - 147 mmol/L 137 136  Potassium 3.4 - 5.3 mmol/L 4.6 4.3  Calcium 8.9 - 10.3 mg/dL (None) 8.8(L)  Phosphorus - (None) (None)  Creatinine 0.5 - 1.1 mg/dL 0.8 0.73  AST 13 - 35 U/L 21 27  Alk Phos 25 - 125 U/L 54 64  Bilirubin 0.3 - 1.2 mg/dL (None) 1.0  Glucose mg/dL 82 92  Cholesterol - (None) (None)  HDL cholesterol - (None) (None)  Triglycerides - (None) (None)  LDL Direct - (None) (None)  LDL Calc - (None) (None)  Total protein 6.5 - 8.1 g/dL (None) 6.0(L)  Albumin 3.5 - 5.0 g/dL (None) 3.6  Some recent data might be hidden   Lab Results  Component Value Date   TSH 4.03 12/19/2015   Lab Results  Component Value Date   BUN 22 (A) 12/19/2015   BUN 20 02/03/2015   BUN 16 01/04/2015   Lab Results  Component Value Date   CREATININE 0.8 12/19/2015   CREATININE 0.73 02/03/2015   CREATININE 0.73 01/04/2015   Lab Results  Component Value Date   HGBA1C 6.0 06/14/2014   HGBA1C 6.3 (H) 01/13/2013       Assessment/Plan 1. Gastroesophageal reflux disease without esophagitis improved  2. Hyperglycemia stable  3. Recurrent UTI UA and culture prior to next visit.

## 2016-02-10 IMAGING — CR DG CHEST 2V
2 series · 2 of 2 positions shown · non-contrast
Comparison: None.

CLINICAL DATA: Preop patellar fracture.

EXAM:
CHEST  2 VIEW

[w chest lat]
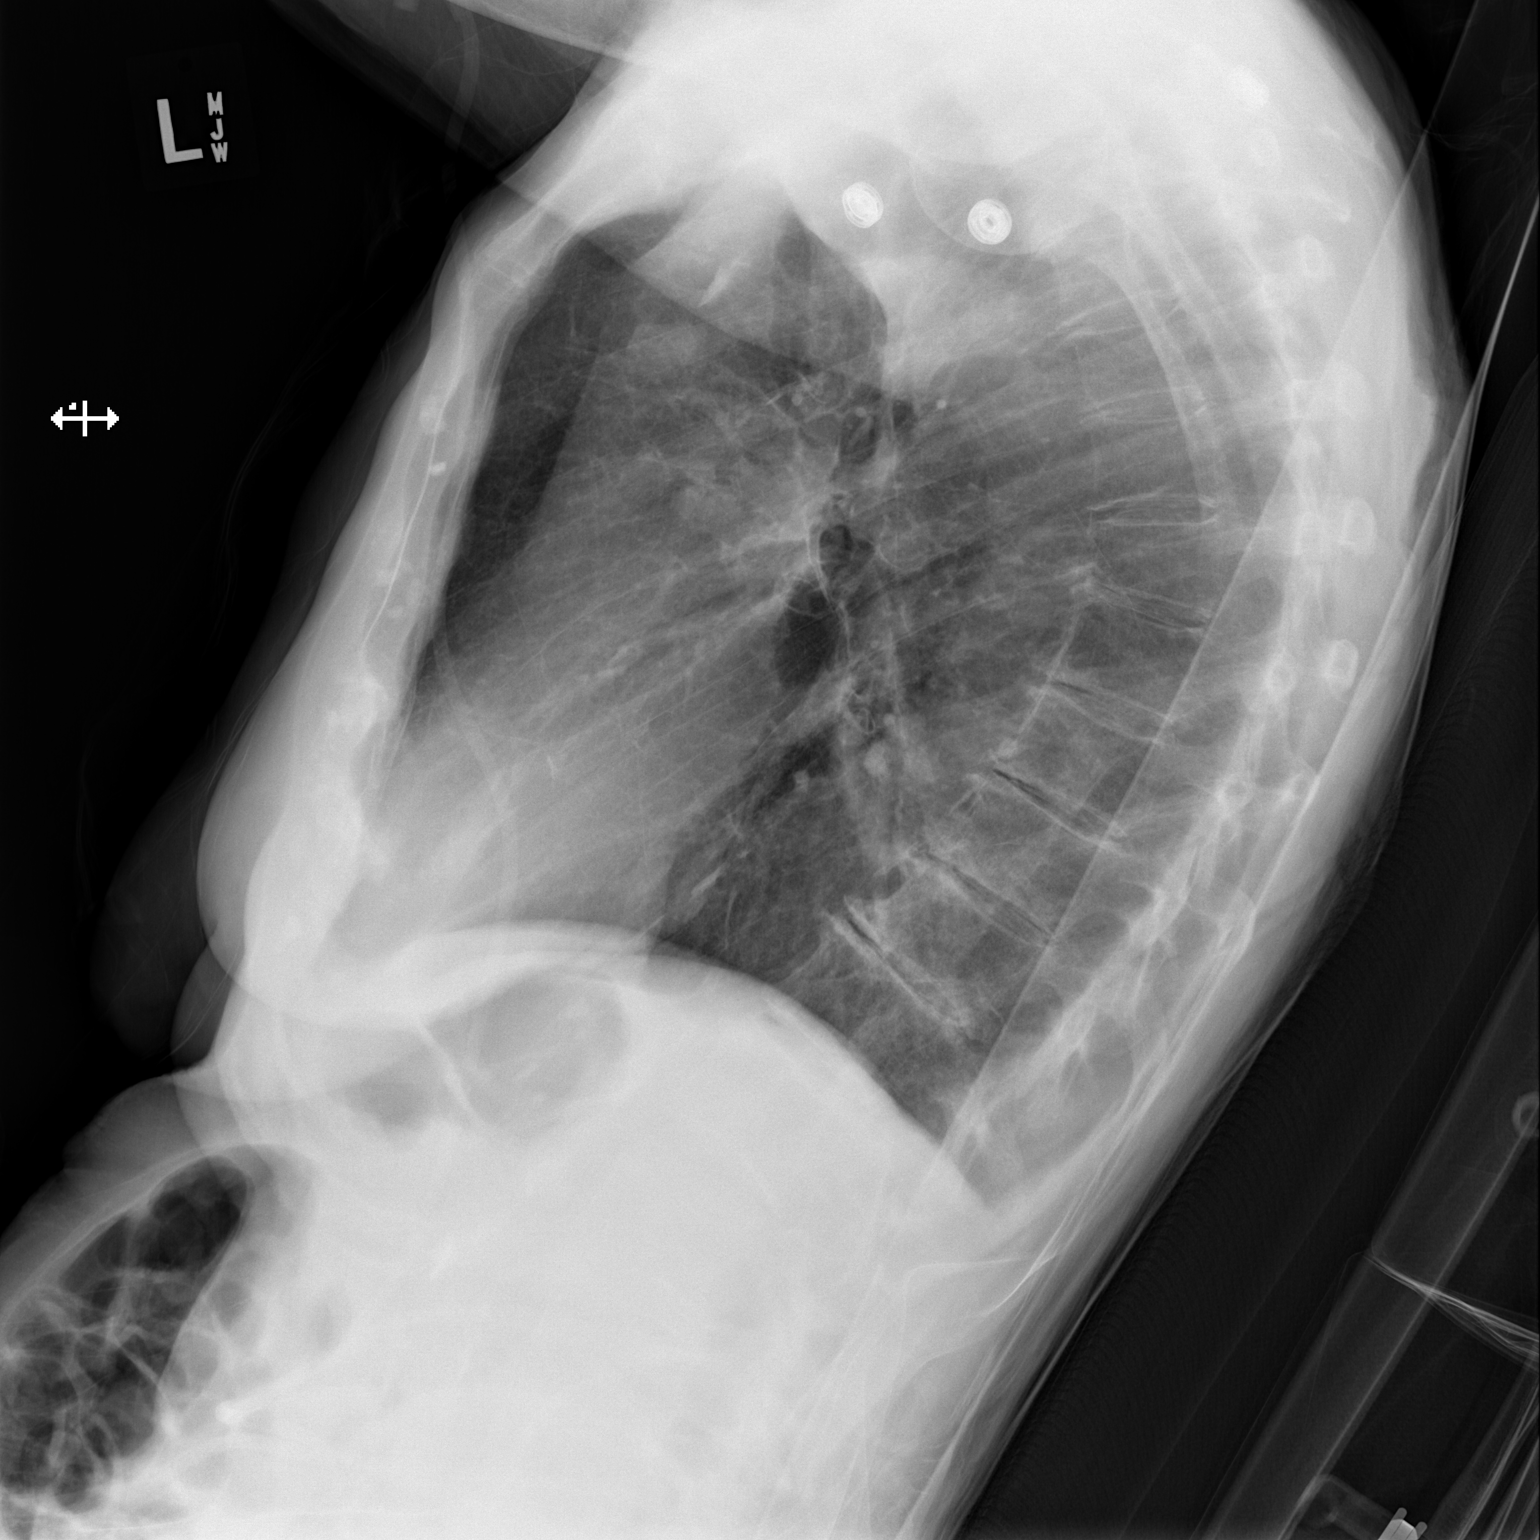

[x chest ap]
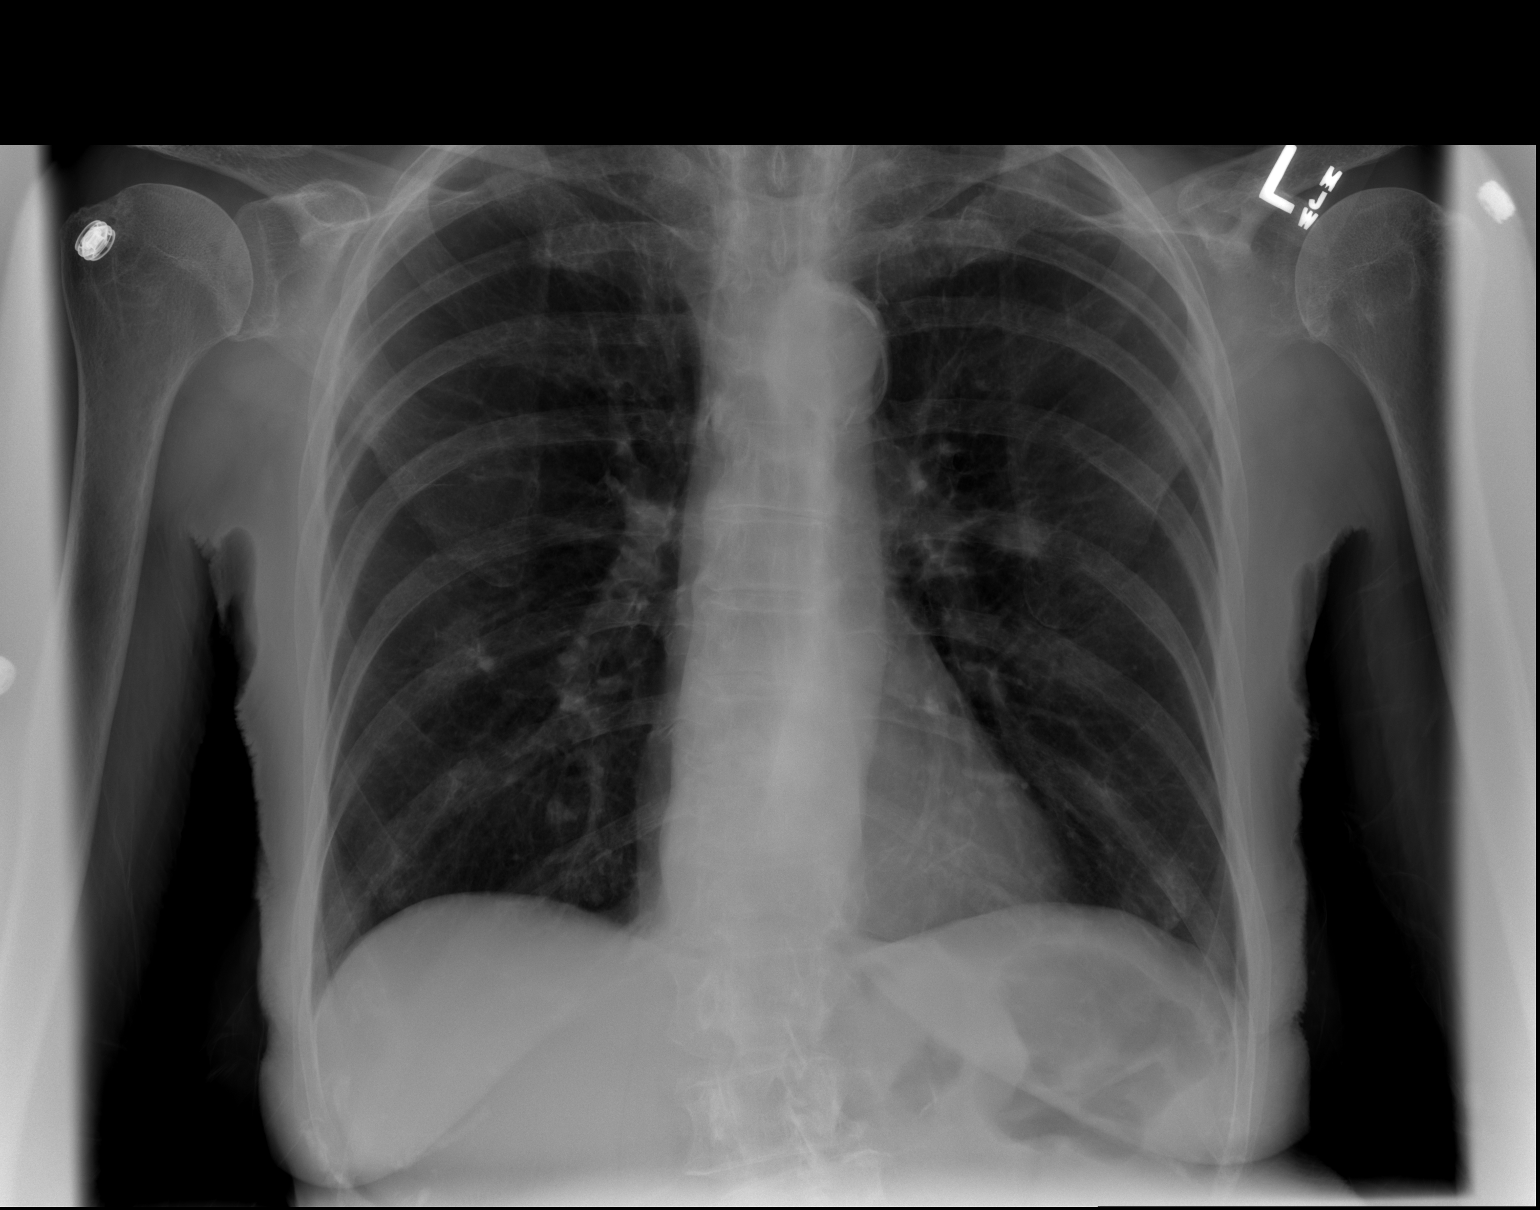

[2 of 2 positions shown; findings below may reference images not displayed]

FINDINGS: Heart is normal size. Calcifications throughout the aorta. No
visible aneurysm. No confluent airspace opacities or effusions. No
acute bony abnormality. Degenerative changes in the thoracic spine.
IMPRESSION: No active cardiopulmonary disease.

## 2016-02-14 DIAGNOSIS — Z23 Encounter for immunization: Secondary | ICD-10-CM | POA: Diagnosis not present

## 2016-02-14 DIAGNOSIS — D045 Carcinoma in situ of skin of trunk: Secondary | ICD-10-CM | POA: Diagnosis not present

## 2016-02-15 DIAGNOSIS — M1712 Unilateral primary osteoarthritis, left knee: Secondary | ICD-10-CM | POA: Diagnosis not present

## 2016-02-29 ENCOUNTER — Telehealth: Payer: Self-pay

## 2016-02-29 ENCOUNTER — Encounter: Payer: Medicare Other | Admitting: Internal Medicine

## 2016-02-29 NOTE — Telephone Encounter (Signed)
Tiffany the clinic nurse from [facility] had called, she felt that the patient needed seen. She is having some paranoid - the patient claims that someone is coming into her apartment and taking her jeans and other stuff. Gave patient appt today with Dr. Nyoka Cowden at 3:15 at the clinic at [facility].  At 2:50 Tiffany called back the patient doesn't want to see Dr. Nyoka Cowden today "she is not sick". Tiffany told the patient she was concern about her. Told her if she gets worse to go to the ED. Cancelled the appt.

## 2016-03-12 DIAGNOSIS — N390 Urinary tract infection, site not specified: Secondary | ICD-10-CM | POA: Diagnosis not present

## 2016-03-18 ENCOUNTER — Other Ambulatory Visit: Payer: Self-pay | Admitting: *Deleted

## 2016-03-18 DIAGNOSIS — N390 Urinary tract infection, site not specified: Secondary | ICD-10-CM

## 2016-03-19 DIAGNOSIS — N390 Urinary tract infection, site not specified: Secondary | ICD-10-CM | POA: Diagnosis not present

## 2016-03-19 DIAGNOSIS — Z87440 Personal history of urinary (tract) infections: Secondary | ICD-10-CM | POA: Diagnosis not present

## 2016-03-19 DIAGNOSIS — Z85828 Personal history of other malignant neoplasm of skin: Secondary | ICD-10-CM | POA: Diagnosis not present

## 2016-03-19 DIAGNOSIS — Z8673 Personal history of transient ischemic attack (TIA), and cerebral infarction without residual deficits: Secondary | ICD-10-CM | POA: Diagnosis not present

## 2016-03-19 DIAGNOSIS — R739 Hyperglycemia, unspecified: Secondary | ICD-10-CM | POA: Diagnosis not present

## 2016-03-19 DIAGNOSIS — B9620 Unspecified Escherichia coli [E. coli] as the cause of diseases classified elsewhere: Secondary | ICD-10-CM | POA: Diagnosis not present

## 2016-03-19 DIAGNOSIS — Z9181 History of falling: Secondary | ICD-10-CM | POA: Diagnosis not present

## 2016-03-19 DIAGNOSIS — M1388 Other specified arthritis, other site: Secondary | ICD-10-CM | POA: Diagnosis not present

## 2016-03-19 LAB — BASIC METABOLIC PANEL
BUN: 25 mg/dL — AB (ref 4–21)
CREATININE: 0.9 mg/dL (ref 0.5–1.1)
GLUCOSE: 85 mg/dL
POTASSIUM: 4.7 mmol/L (ref 3.4–5.3)
SODIUM: 140 mmol/L (ref 137–147)

## 2016-03-19 LAB — CBC AND DIFFERENTIAL
HCT: 43 % (ref 36–46)
HEMOGLOBIN: 14.3 g/dL (ref 12.0–16.0)
Platelets: 220 10*3/uL (ref 150–399)
WBC: 5.5 10*3/mL

## 2016-03-19 LAB — HEPATIC FUNCTION PANEL
ALT: 15 U/L (ref 7–35)
AST: 20 U/L (ref 13–35)
Alkaline Phosphatase: 52 U/L (ref 25–125)
Bilirubin, Total: 0.4 mg/dL

## 2016-03-20 DIAGNOSIS — B9620 Unspecified Escherichia coli [E. coli] as the cause of diseases classified elsewhere: Secondary | ICD-10-CM | POA: Diagnosis not present

## 2016-03-20 DIAGNOSIS — N390 Urinary tract infection, site not specified: Secondary | ICD-10-CM | POA: Diagnosis not present

## 2016-03-21 ENCOUNTER — Encounter: Payer: Self-pay | Admitting: Nurse Practitioner

## 2016-03-21 ENCOUNTER — Encounter: Payer: Medicare Other | Admitting: Nurse Practitioner

## 2016-03-21 ENCOUNTER — Non-Acute Institutional Stay: Payer: Medicare Other | Admitting: Nurse Practitioner

## 2016-03-21 DIAGNOSIS — R42 Dizziness and giddiness: Secondary | ICD-10-CM | POA: Diagnosis not present

## 2016-03-21 DIAGNOSIS — N390 Urinary tract infection, site not specified: Secondary | ICD-10-CM | POA: Diagnosis not present

## 2016-03-21 DIAGNOSIS — B9620 Unspecified Escherichia coli [E. coli] as the cause of diseases classified elsewhere: Secondary | ICD-10-CM | POA: Diagnosis not present

## 2016-03-21 DIAGNOSIS — K219 Gastro-esophageal reflux disease without esophagitis: Secondary | ICD-10-CM | POA: Diagnosis not present

## 2016-03-21 NOTE — Assessment & Plan Note (Signed)
Stable, no taking acid reducer 

## 2016-03-21 NOTE — Assessment & Plan Note (Addendum)
01/06/16 urine culture E. Coli, Gentamicin per pharm x 7 days. Psychotic features associated with UTI

## 2016-03-21 NOTE — Progress Notes (Signed)
Location:   [facility]    Place of Service:  Clinic (12)   Provider: Marlana Latus NP  Code Status: DNR  Goals of Care: [place] 03/21/2016  Does Patient Have a Medical Advance Directive? Yes  Type of Advance Directive Living will  Does patient want to make changes to medical advance directive? No - Patient declined  Copy of [place] in Chart? Yes  Pre-existing out of facility DNR order (yellow form or pink MOST form) -     Chief Complaint  Patient presents with  . Medical Management of Chronic Issues    RLE skin tear, Fatigue    HPI: Patient is a 82 y.o. female seen today for 01/06/16 urine culture E. Coli, Gentamicin per pharm x 7 days    Hx of TIA, GERD, carotid arthrosclerosis, lower back pain, urine frequency, unsteady gait, able to walk w/o assisted device.   Past Medical History:  Diagnosis Date  . Allergy   . Arthritis   . Cancer (HCC)    squamous cell  . Female stress incontinence   . Frequent loose stools   . GERD (gastroesophageal reflux disease)   . Incontinence of urine   . Insomnia   . Nocturia   . Oxygen deficiency   . Sepsis due to Escherichia coli (E. coli) ([place])   . Squamous cell carcinoma of skin   . Stroke [facility])    TIA  . TIA (transient ischemic attack) 01/05/13    Past Surgical History:  Procedure Laterality Date  . ABDOMINAL HYSTERECTOMY  1959  . APPENDECTOMY  1959  . BREAST SURGERY  1982   breast  . COLONOSCOPY  2013   Dr. Carol Ada  . ORIF PATELLA Right 02/03/2015   Procedure: OPEN REDUCTION INTERNAL (ORIF) FIXATION RIGHT PATELLA;  Surgeon: Dorna Leitz, MD;  Location: [facility];  Service: Orthopedics;  Laterality: Right;  . TEE WITHOUT CARDIOVERSION N/A 01/18/2013   Procedure: TRANSESOPHAGEAL ECHOCARDIOGRAM (TEE);  Surgeon: Lelon Perla, MD;  Location: [facility] ENDOSCOPY;  Service: Cardiovascular;  Laterality: N/A;  . TONSILLECTOMY  1940    Allergies  Allergen Reactions  . Macrobid [Nitrofurantoin Monohyd  Macro] Nausea Only  . Penicillin G Itching    Has patient had a PCN reaction causing immediate rash, facial/tongue/throat swelling, SOB or lightheadedness with hypotension: No Has patient had a PCN reaction causing severe rash involving mucus membranes or skin necrosis: No Has patient had a PCN reaction that required hospitalization No Has patient had a PCN reaction occurring within the last 10 years: No If all of the above answers are "NO", then may proceed with Cephalosporin use.  Sarina Ill [Bactrim]     Stomach concerns.   . Penicillins Other (See Comments)    Per [facility]  . Septra [Sulfamethoxazole-Trimethoprim] Other (See Comments)    Per [facility]      Medication List       Accurate as of 03/21/16  2:00 PM. Always use your most recent med list.          aspirin EC 81 MG tablet Take 81 mg by mouth daily.   b complex vitamins tablet Take 1 tablet by mouth daily.   beta carotene w/minerals tablet Take 1 tablet by mouth daily.   OYSTER SHELL CALCIUM + D 500-200 MG-UNIT Tabs Generic drug:  Calcium Carb-Cholecalciferol Take 1 tablet by mouth daily.   CALCIUM 600 + D PO Take 1 tablet by mouth daily.   gentamicin 40 MG/ML injection Commonly known  as:  GARAMYCIN Inject 40 mg into the muscle daily.   loperamide 2 MG capsule Commonly known as:  IMODIUM Take 2 mg by mouth as needed for diarrhea or loose stools.   SYSTANE ULTRA 0.4-0.3 % Soln Generic drug:  Polyethyl Glycol-Propyl Glycol Place 1 drop into both eyes 3 (three) times daily.   SYSTANE 0.4-0.3 % Gel ophthalmic gel Generic drug:  Polyethyl Glycol-Propyl Glycol Place 1 application into both eyes at bedtime.   vitamin C 1000 MG tablet Take 1,000 mg by mouth daily.       Review of Systems:  Review of Systems  Constitutional: Negative.  Negative for activity change, appetite change, chills, diaphoresis, fatigue, fever and unexpected weight change.  HENT: Positive for hearing loss and voice change (hoarse).  Negative for congestion, ear discharge, ear pain, postnasal drip, rhinorrhea, sore throat, tinnitus and trouble swallowing.   Eyes: Negative.  Negative for pain, redness, itching and visual disturbance.  Respiratory: Negative for cough, choking, chest tightness, shortness of breath and wheezing.   Cardiovascular: Negative for chest pain, palpitations and leg swelling.  Gastrointestinal: Positive for diarrhea. Negative for abdominal distention, abdominal pain, constipation and nausea.  Endocrine: Negative.  Negative for cold intolerance, heat intolerance, polydipsia, polyphagia and polyuria.  Genitourinary: Negative for difficulty urinating, dysuria, flank pain, frequency, hematuria, pelvic pain, urgency and vaginal discharge.       Incontinent of urine.  Musculoskeletal: Negative for arthralgias, back pain, gait problem, myalgias, neck pain and neck stiffness.       02/03/2015 ORIF of a closed displaced transverse fracture of right patella. Right leg splint. Left knee pain. Hx of tibial plateau fracture. Osteoarthritis of the fingers.  Skin: Negative.  Negative for color change, pallor and rash.  Allergic/Immunologic: Negative.   Neurological: Positive for dizziness. Negative for tremors, seizures, syncope, weakness, numbness and headaches.  Hematological: Negative.  Negative for adenopathy. Does not bruise/bleed easily.  Psychiatric/Behavioral: Negative for agitation, behavioral problems, confusion, dysphoric mood, hallucinations, sleep disturbance and suicidal ideas. The patient is not nervous/anxious and is not hyperactive.     Health Maintenance  Topic Date Due  . ZOSTAVAX  05/29/1984  . DEXA SCAN  05/29/1989  . PNA vac Low Risk Adult (2 of 2 - PPSV23) 12/27/2010  . TETANUS/TDAP  01/14/2025  . INFLUENZA VACCINE  Completed    Physical Exam: Vitals:   03/21/16 1337  BP: (!) 110/58  Pulse: 84  Resp: 20  Temp: 97.7 F (36.5 C)  SpO2: 96%  Weight: 99 lb 3.2 oz (45 kg)  Height: 5'  1" (1.549 m)   Body mass index is 18.74 kg/m. Physical Exam  Constitutional: She is oriented to person, place, and time. She appears well-developed and well-nourished. No distress.  HENT:  Head: Normocephalic and atraumatic.  Right Ear: External ear normal.  Left Ear: External ear normal.  Nose: Nose normal.  Mouth/Throat: Oropharynx is clear and moist. No oropharyngeal exudate.  Bilateral hearing aids. Profound loss of hearing.  Eyes: Conjunctivae and EOM are normal. Pupils are equal, round, and reactive to light. Right eye exhibits no discharge. Left eye exhibits no discharge. No scleral icterus.  Neck: Normal range of motion. Neck supple. No JVD present. No tracheal deviation present. No thyromegaly present.  Cardiovascular: Normal rate, regular rhythm, normal heart sounds and intact distal pulses.   No murmur heard. Pulmonary/Chest: Effort normal and breath sounds normal. No respiratory distress. She has no wheezes. She has no rales.  Abdominal: Soft. Bowel sounds are normal. She exhibits  no distension. There is no tenderness.  Musculoskeletal: She exhibits edema and tenderness.  Left knee pain.  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 Tender SI joint areas bilaterally. Able to stand and sit unassisted.  Neurological: She is alert and oriented to person, place, and time. She displays normal reflexes. No cranial nerve deficit. She exhibits normal muscle tone. Coordination normal.  Skin: No rash noted. She is not diaphoretic. No erythema. No pallor.  Brownish pigmented BLE  Psychiatric: She has a normal mood and affect. Her behavior is normal. Judgment and thought content normal.  Talks constantly and changes her subjects frequently.     Labs reviewed: Basic Metabolic Panel:  Recent Labs  12/19/15  NA 137  K 4.6  BUN 22*  CREATININE 0.8  TSH 4.03   Liver Function Tests:  Recent Labs  12/19/15  AST 21  ALT 17  ALKPHOS 54   No results for  input(s): LIPASE, AMYLASE in the last 8760 hours. No results for input(s): AMMONIA in the last 8760 hours. CBC:  Recent Labs  12/19/15  WBC 6.2  HGB 13.9  HCT 41  PLT 211   Lipid Panel: No results for input(s): CHOL, HDL, LDLCALC, TRIG, CHOLHDL, LDLDIRECT in the last 8760 hours. Lab Results  Component Value Date   HGBA1C 6.0 06/14/2014    Procedures since last visit: No results found.  Assessment/Plan Recurrent UTI 01/06/16 urine culture E. Coli, Gentamicin per pharm x 7 days. Psychotic features associated with UTI   GERD (gastroesophageal reflux disease) Stable, no taking acid reducer.     Dizziness and giddiness 01/01/16 CT head IMPRESSION: Atrophy with small vessel chronic ischemic changes of deep cerebral white matter. No acute intracranial abnormalities 01/01/16 Carotid artery [place] R+L 1-39% stenosis.      Labs/tests ordered: repeat UA C/S upon completion of ABT  Next appt:  none

## 2016-03-21 NOTE — Assessment & Plan Note (Signed)
01/01/16 CT head IMPRESSION: Atrophy with small vessel chronic ischemic changes of deep cerebral white matter. No acute intracranial abnormalities 01/01/16 Carotid artery [place] R+L 1-39% stenosis.

## 2016-03-22 DIAGNOSIS — B9620 Unspecified Escherichia coli [E. coli] as the cause of diseases classified elsewhere: Secondary | ICD-10-CM | POA: Diagnosis not present

## 2016-03-22 DIAGNOSIS — N390 Urinary tract infection, site not specified: Secondary | ICD-10-CM | POA: Diagnosis not present

## 2016-03-23 DIAGNOSIS — N390 Urinary tract infection, site not specified: Secondary | ICD-10-CM | POA: Diagnosis not present

## 2016-03-23 DIAGNOSIS — B9620 Unspecified Escherichia coli [E. coli] as the cause of diseases classified elsewhere: Secondary | ICD-10-CM | POA: Diagnosis not present

## 2016-03-24 DIAGNOSIS — N390 Urinary tract infection, site not specified: Secondary | ICD-10-CM | POA: Diagnosis not present

## 2016-03-24 DIAGNOSIS — B9620 Unspecified Escherichia coli [E. coli] as the cause of diseases classified elsewhere: Secondary | ICD-10-CM | POA: Diagnosis not present

## 2016-03-25 DIAGNOSIS — B9620 Unspecified Escherichia coli [E. coli] as the cause of diseases classified elsewhere: Secondary | ICD-10-CM | POA: Diagnosis not present

## 2016-03-25 DIAGNOSIS — N390 Urinary tract infection, site not specified: Secondary | ICD-10-CM | POA: Diagnosis not present

## 2016-03-26 DIAGNOSIS — B9620 Unspecified Escherichia coli [E. coli] as the cause of diseases classified elsewhere: Secondary | ICD-10-CM | POA: Diagnosis not present

## 2016-03-26 DIAGNOSIS — N390 Urinary tract infection, site not specified: Secondary | ICD-10-CM | POA: Diagnosis not present

## 2016-03-27 DIAGNOSIS — B9620 Unspecified Escherichia coli [E. coli] as the cause of diseases classified elsewhere: Secondary | ICD-10-CM | POA: Diagnosis not present

## 2016-03-27 DIAGNOSIS — N390 Urinary tract infection, site not specified: Secondary | ICD-10-CM | POA: Diagnosis not present

## 2016-03-28 ENCOUNTER — Encounter: Payer: Self-pay | Admitting: Internal Medicine

## 2016-03-28 ENCOUNTER — Non-Acute Institutional Stay: Payer: Medicare Other | Admitting: Internal Medicine

## 2016-03-28 ENCOUNTER — Telehealth: Payer: Self-pay

## 2016-03-28 DIAGNOSIS — I679 Cerebrovascular disease, unspecified: Secondary | ICD-10-CM

## 2016-03-28 DIAGNOSIS — G319 Degenerative disease of nervous system, unspecified: Secondary | ICD-10-CM

## 2016-03-28 DIAGNOSIS — B9620 Unspecified Escherichia coli [E. coli] as the cause of diseases classified elsewhere: Secondary | ICD-10-CM | POA: Diagnosis not present

## 2016-03-28 DIAGNOSIS — N390 Urinary tract infection, site not specified: Secondary | ICD-10-CM | POA: Diagnosis not present

## 2016-03-28 NOTE — Progress Notes (Signed)
Facility  [facility]    Place of Service: Clinic (12)     Allergies  Allergen Reactions  . Macrobid [Nitrofurantoin Monohyd Macro] Nausea Only  . Penicillin G Itching    Has patient had a PCN reaction causing immediate rash, facial/tongue/throat swelling, SOB or lightheadedness with hypotension: No Has patient had a PCN reaction causing severe rash involving mucus membranes or skin necrosis: No Has patient had a PCN reaction that required hospitalization No Has patient had a PCN reaction occurring within the last 10 years: No If all of the above answers are "NO", then may proceed with Cephalosporin use.  Sarina Ill [Bactrim]     Stomach concerns.   . Penicillins Other (See Comments)    Per [facility]  . Septra [Sulfamethoxazole-Trimethoprim] Other (See Comments)    Per [facility]    Chief Complaint  Patient presents with  . Acute Visit    started 03/25/16 trouble can't get words out right, had terrible headache, had a "hot pink" circle on her right side. Took Aleve went to bed, next morning was better. Trouble with balance not sure of her self.   Marland Kitchen Ilda Mori nurse with [facility] has been giving patient IV antibiodic for UTI, today last day. She has notice patient having trouble finding her words, her right pupil small then left pupil. new onset of balance problems. Wants to know if she should continue seeing patient?    HPI:  Has had a couple of episode suggestive of acute neurologic change. She wonders if she has had a stroke. Complains of not walking as fast, not as stable on the feet. brief episode of slurred speech. One episode was started by a headache and she also had a visual disturbance of seeing a large pink circle. On her right side. She denies hx of migraine in herself or family. She has had 4 CT brain scans in 4 years due to falls, or complaint of weakness. The most recent was in Sept 2017. All scans are simialar and show cerebral atrophy and SVD. There has not been  evidence of acute or prior infarct.  Most recently, a home care nurse noted anisocoria with right pupil larger than the left.  Medications: Patient's Medications  New Prescriptions   No medications on file  Previous Medications   ASCORBIC ACID (VITAMIN C) 1000 MG TABLET    Take 1,000 mg by mouth daily.   ASPIRIN EC 81 MG TABLET    Take 81 mg by mouth daily.    B COMPLEX VITAMINS TABLET    Take 1 tablet by mouth daily.   BETA CAROTENE W/MINERALS (OCUVITE) TABLET    Take 1 tablet by mouth daily.   CALCIUM CARB-CHOLECALCIFEROL (OYSTER SHELL CALCIUM + D) 500-200 MG-UNIT TABS    Take 1 tablet by mouth daily.   CALCIUM CARBONATE-VITAMIN D (CALCIUM 600 + D PO)    Take 1 tablet by mouth daily.   LOPERAMIDE (IMODIUM) 2 MG CAPSULE    Take 2 mg by mouth as needed for diarrhea or loose stools.   POLYETHYL GLYCOL-PROPYL GLYCOL (SYSTANE ULTRA) 0.4-0.3 % SOLN    Place 1 drop into both eyes 3 (three) times daily.   POLYETHYL GLYCOL-PROPYL GLYCOL (SYSTANE) 0.4-0.3 % GEL OPHTHALMIC GEL    Place 1 application into both eyes at bedtime.  Modified Medications   No medications on file  Discontinued Medications   No medications on file     Review of Systems  Constitutional:  Negative.  Negative for activity change, appetite change, chills, diaphoresis, fatigue, fever and unexpected weight change.  HENT: Positive for hearing loss and voice change (hoarse). Negative for congestion, ear discharge, ear pain, postnasal drip, rhinorrhea, sore throat, tinnitus and trouble swallowing.   Eyes: Negative for pain, redness, itching and visual disturbance.       Hx anisocoria  Respiratory: Negative for cough, choking, chest tightness, shortness of breath and wheezing.   Cardiovascular: Negative for chest pain, palpitations and leg swelling.  Gastrointestinal: Positive for diarrhea. Negative for abdominal distention, abdominal pain, constipation and nausea.  Endocrine: Negative.  Negative for cold intolerance, heat  intolerance, polydipsia, polyphagia and polyuria.  Genitourinary: Negative for difficulty urinating, dysuria, flank pain, frequency, hematuria, pelvic pain, urgency and vaginal discharge.       Incontinent of urine.  Musculoskeletal: Negative for arthralgias, back pain, gait problem, myalgias, neck pain and neck stiffness.       02/03/2015 ORIF of a closed displaced transverse fracture of right patella. Right leg splint. Left knee pain. Hx of tibial plateau fracture. Osteoarthritis of the fingers.  Skin: Negative.  Negative for color change, pallor and rash.  Allergic/Immunologic: Negative.   Neurological: Positive for dizziness. Negative for tremors, seizures, syncope, weakness, numbness and headaches.  Hematological: Negative.  Negative for adenopathy. Does not bruise/bleed easily.  Psychiatric/Behavioral: Negative for agitation, behavioral problems, confusion, dysphoric mood, hallucinations, sleep disturbance and suicidal ideas. The patient is not nervous/anxious and is not hyperactive.     Vitals:   03/28/16 1409  BP: (!) 114/58  Pulse: 75  Temp: 98.6 F (37 C)  TempSrc: Oral  SpO2: 95%  Weight: 99 lb (44.9 kg)  Height: 5' 1"  (1.549 m)   Wt Readings from Last 3 Encounters:  03/28/16 99 lb (44.9 kg)  03/21/16 99 lb 3.2 oz (45 kg)  02/01/16 99 lb (44.9 kg)    Body mass index is 18.71 kg/m.  Physical Exam  Constitutional: She is oriented to person, place, and time. She appears well-developed and well-nourished. No distress.  HENT:  Head: Normocephalic and atraumatic.  Right Ear: External ear normal.  Left Ear: External ear normal.  Nose: Nose normal.  Mouth/Throat: Oropharynx is clear and moist. No oropharyngeal exudate.  Bilateral hearing aids. Profound loss of hearing.  Eyes: Conjunctivae and EOM are normal. Right eye exhibits no discharge. Left eye exhibits no discharge. No scleral icterus.  Right pupil larger than the left pupil.  Neck: Normal range of motion. Neck  supple. No JVD present. No tracheal deviation present. No thyromegaly present.  Cardiovascular: Normal rate, regular rhythm, normal heart sounds and intact distal pulses.   No murmur heard. Pulmonary/Chest: Effort normal and breath sounds normal. No respiratory distress. She has no wheezes. She has no rales.  Abdominal: Soft. Bowel sounds are normal. She exhibits no distension. There is no tenderness.  Musculoskeletal: She exhibits edema and tenderness.  Left knee pain.  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 Tender SI joint areas bilaterally. Able to stand and sit unassisted.  Neurological: She is alert and oriented to person, place, and time. She displays normal reflexes. No cranial nerve deficit. She exhibits normal muscle tone. Coordination normal.  Skin: No rash noted. She is not diaphoretic. No erythema. No pallor.  Brownish pigmented BLE  Psychiatric: She has a normal mood and affect. Her behavior is normal. Judgment and thought content normal.  Talks constantly and changes her subjects frequently.      Labs reviewed: Lab Summary  Latest Ref Rng & Units 12/19/2015 02/03/2015  Hemoglobin 12.0 - 16.0 g/dL 13.9 13.1  Hematocrit 36 - 46 % 41 40.0  White count 10:3/mL 6.2 7.3  Platelet count 150 - 399 K/L 211 231  Sodium 137 - 147 mmol/L 137 136  Potassium 3.4 - 5.3 mmol/L 4.6 4.3  Calcium 8.9 - 10.3 mg/dL (None) 8.8(L)  Phosphorus - (None) (None)  Creatinine 0.5 - 1.1 mg/dL 0.8 0.73  AST 13 - 35 U/L 21 27  Alk Phos 25 - 125 U/L 54 64  Bilirubin 0.3 - 1.2 mg/dL (None) 1.0  Glucose mg/dL 82 92  Cholesterol - (None) (None)  HDL cholesterol - (None) (None)  Triglycerides - (None) (None)  LDL Direct - (None) (None)  LDL Calc - (None) (None)  Total protein 6.5 - 8.1 g/dL (None) 6.0(L)  Albumin 3.5 - 5.0 g/dL (None) 3.6  Some recent data might be hidden   Lab Results  Component Value Date   TSH 4.03 12/19/2015   Lab Results  Component Value Date    BUN 22 (A) 12/19/2015   BUN 20 02/03/2015   BUN 16 01/04/2015   Lab Results  Component Value Date   CREATININE 0.8 12/19/2015   CREATININE 0.73 02/03/2015   CREATININE 0.73 01/04/2015   Lab Results  Component Value Date   HGBA1C 6.0 06/14/2014   HGBA1C 6.3 (H) 01/13/2013    Ct Head Wo Contrast  Result Date: 01/03/2016 CLINICAL DATA:  Dizziness, giddiness, difficulty with balance for 4 months, former smoker EXAM: CT HEAD WITHOUT CONTRAST TECHNIQUE: Contiguous axial images were obtained from the base of the skull through the vertex without intravenous contrast. COMPARISON:  10/21/2014 FINDINGS: Generalized atrophy. Normal ventricular morphology. No midline shift or mass effect. Small vessel chronic ischemic changes of deep cerebral white matter. No intracranial hemorrhage, mass lesion, evidence acute infarction, or extra-axial fluid collection. Bones demineralized. Atherosclerotic calcifications at carotid siphons. Paranasal sinuses and mastoid air cells clear. IMPRESSION: Atrophy with small vessel chronic ischemic changes of deep cerebral white matter. No acute intracranial abnormalities. Electronically Signed   By: Lavonia Dana M.D.   On: 01/03/2016 11:24     Assessment/Plan  1. Cerebral atrophy previously documented  2. Cerebrovascular disease Previously documented.  I think patients current complaints are related to her known cerebrovascular disease and possibly to her cerebral atrophy. I t is possible that the visual disturbance is a migraine variant, but this is very late in life for this to surface as a new diagnosis. I do not think additional imaging would be helpful at this point. Patient was advised that her problem is age related crebral degeneration. She should try to continue a normal busy life.

## 2016-03-28 NOTE — Telephone Encounter (Signed)
Patient is being seen today, [name] called to give update on status. Patient c/o balance issues and confusion. Right pupil is 2 mm and left pupil is 3 mm. ? If patient had a stroke.

## 2016-03-28 NOTE — Telephone Encounter (Signed)
Patient was seen today, no symptoms today. Called Ena Dawley from [facility] to let her know that Dr. Nyoka Cowden doesn't feel there is any need at this time for continue Home Health. We appreciate her calling us. She will d/c patient today.

## 2016-04-01 ENCOUNTER — Other Ambulatory Visit: Payer: Self-pay | Admitting: Internal Medicine

## 2016-04-01 ENCOUNTER — Telehealth: Payer: Self-pay

## 2016-04-01 DIAGNOSIS — R300 Dysuria: Secondary | ICD-10-CM | POA: Diagnosis not present

## 2016-04-01 DIAGNOSIS — I679 Cerebrovascular disease, unspecified: Secondary | ICD-10-CM

## 2016-04-01 NOTE — Telephone Encounter (Signed)
Patient had been taking to younger friends and family. Concern about the headaches. Wonder if she should get MRI. She had CT 12/2015, and MRI 2014, per Dr. Nyoka Cowden he suggest referral to Neurologist. We will put in the referral, after the doctor reviews our records his office will call you with the appt. It might take 2-3 weeks for them to call you back. Message to Dr. Nyoka Cowden

## 2016-04-01 NOTE — Progress Notes (Signed)
Patient would like further evaluation. Will refer to Neuro. She has had 4 Ct brain scans in the last 3 years. Also had MR brain and MRA brain in 2014. All scans seem to show the same things; cerebral atrophy and cerebrovascular disease, especially small vessel disease.

## 2016-04-02 ENCOUNTER — Emergency Department (HOSPITAL_COMMUNITY): Payer: Medicare Other

## 2016-04-02 ENCOUNTER — Encounter (HOSPITAL_COMMUNITY): Payer: Self-pay | Admitting: Emergency Medicine

## 2016-04-02 ENCOUNTER — Inpatient Hospital Stay (HOSPITAL_COMMUNITY)
Admission: EM | Admit: 2016-04-02 | Discharge: 2016-04-05 | DRG: 055 | Disposition: A | Discharge status: Skilled Nursing Facility | Payer: Medicare Other | Attending: Internal Medicine | Admitting: Internal Medicine

## 2016-04-02 DIAGNOSIS — M79602 Pain in left arm: Secondary | ICD-10-CM | POA: Diagnosis not present

## 2016-04-02 DIAGNOSIS — S32501A Unspecified fracture of right pubis, initial encounter for closed fracture: Secondary | ICD-10-CM | POA: Diagnosis not present

## 2016-04-02 DIAGNOSIS — S32810A Multiple fractures of pelvis with stable disruption of pelvic ring, initial encounter for closed fracture: Secondary | ICD-10-CM | POA: Diagnosis not present

## 2016-04-02 DIAGNOSIS — W010XXA Fall on same level from slipping, tripping and stumbling without subsequent striking against object, initial encounter: Secondary | ICD-10-CM | POA: Diagnosis present

## 2016-04-02 DIAGNOSIS — C7931 Secondary malignant neoplasm of brain: Secondary | ICD-10-CM | POA: Diagnosis not present

## 2016-04-02 DIAGNOSIS — R4781 Slurred speech: Secondary | ICD-10-CM

## 2016-04-02 DIAGNOSIS — R278 Other lack of coordination: Secondary | ICD-10-CM | POA: Diagnosis not present

## 2016-04-02 DIAGNOSIS — S32591A Other specified fracture of right pubis, initial encounter for closed fracture: Secondary | ICD-10-CM | POA: Diagnosis not present

## 2016-04-02 DIAGNOSIS — C4492 Squamous cell carcinoma of skin, unspecified: Secondary | ICD-10-CM

## 2016-04-02 DIAGNOSIS — Z66 Do not resuscitate: Secondary | ICD-10-CM | POA: Diagnosis present

## 2016-04-02 DIAGNOSIS — Z801 Family history of malignant neoplasm of trachea, bronchus and lung: Secondary | ICD-10-CM

## 2016-04-02 DIAGNOSIS — Z515 Encounter for palliative care: Secondary | ICD-10-CM

## 2016-04-02 DIAGNOSIS — Z87891 Personal history of nicotine dependence: Secondary | ICD-10-CM

## 2016-04-02 DIAGNOSIS — G459 Transient cerebral ischemic attack, unspecified: Secondary | ICD-10-CM

## 2016-04-02 DIAGNOSIS — Z4789 Encounter for other orthopedic aftercare: Secondary | ICD-10-CM | POA: Diagnosis not present

## 2016-04-02 DIAGNOSIS — S42309A Unspecified fracture of shaft of humerus, unspecified arm, initial encounter for closed fracture: Secondary | ICD-10-CM | POA: Diagnosis not present

## 2016-04-02 DIAGNOSIS — Z803 Family history of malignant neoplasm of breast: Secondary | ICD-10-CM

## 2016-04-02 DIAGNOSIS — Z85828 Personal history of other malignant neoplasm of skin: Secondary | ICD-10-CM

## 2016-04-02 DIAGNOSIS — S32592A Other specified fracture of left pubis, initial encounter for closed fracture: Secondary | ICD-10-CM | POA: Diagnosis present

## 2016-04-02 DIAGNOSIS — Z880 Allergy status to penicillin: Secondary | ICD-10-CM | POA: Diagnosis not present

## 2016-04-02 DIAGNOSIS — Z7982 Long term (current) use of aspirin: Secondary | ICD-10-CM | POA: Diagnosis not present

## 2016-04-02 DIAGNOSIS — C801 Malignant (primary) neoplasm, unspecified: Secondary | ICD-10-CM | POA: Diagnosis not present

## 2016-04-02 DIAGNOSIS — T148XXA Other injury of unspecified body region, initial encounter: Secondary | ICD-10-CM | POA: Diagnosis not present

## 2016-04-02 DIAGNOSIS — S52572A Other intraarticular fracture of lower end of left radius, initial encounter for closed fracture: Secondary | ICD-10-CM | POA: Diagnosis present

## 2016-04-02 DIAGNOSIS — S52502A Unspecified fracture of the lower end of left radius, initial encounter for closed fracture: Secondary | ICD-10-CM | POA: Diagnosis not present

## 2016-04-02 DIAGNOSIS — Y92008 Other place in unspecified non-institutional (private) residence as the place of occurrence of the external cause: Secondary | ICD-10-CM

## 2016-04-02 DIAGNOSIS — Z8249 Family history of ischemic heart disease and other diseases of the circulatory system: Secondary | ICD-10-CM

## 2016-04-02 DIAGNOSIS — Z79899 Other long term (current) drug therapy: Secondary | ICD-10-CM

## 2016-04-02 DIAGNOSIS — S52502D Unspecified fracture of the lower end of left radius, subsequent encounter for closed fracture with routine healing: Secondary | ICD-10-CM | POA: Diagnosis not present

## 2016-04-02 DIAGNOSIS — R2689 Other abnormalities of gait and mobility: Secondary | ICD-10-CM | POA: Diagnosis not present

## 2016-04-02 DIAGNOSIS — W19XXXA Unspecified fall, initial encounter: Secondary | ICD-10-CM | POA: Diagnosis not present

## 2016-04-02 DIAGNOSIS — M6281 Muscle weakness (generalized): Secondary | ICD-10-CM | POA: Diagnosis not present

## 2016-04-02 DIAGNOSIS — S52612A Displaced fracture of left ulna styloid process, initial encounter for closed fracture: Secondary | ICD-10-CM | POA: Diagnosis present

## 2016-04-02 DIAGNOSIS — S329XXA Fracture of unspecified parts of lumbosacral spine and pelvis, initial encounter for closed fracture: Secondary | ICD-10-CM | POA: Diagnosis not present

## 2016-04-02 DIAGNOSIS — S32511D Fracture of superior rim of right pubis, subsequent encounter for fracture with routine healing: Secondary | ICD-10-CM | POA: Diagnosis not present

## 2016-04-02 DIAGNOSIS — W19XXXD Unspecified fall, subsequent encounter: Secondary | ICD-10-CM | POA: Diagnosis not present

## 2016-04-02 DIAGNOSIS — Z7189 Other specified counseling: Secondary | ICD-10-CM | POA: Diagnosis not present

## 2016-04-02 DIAGNOSIS — M25532 Pain in left wrist: Secondary | ICD-10-CM | POA: Diagnosis not present

## 2016-04-02 DIAGNOSIS — M79605 Pain in left leg: Secondary | ICD-10-CM | POA: Diagnosis not present

## 2016-04-02 LAB — BASIC METABOLIC PANEL
ANION GAP: 7 (ref 5–15)
BUN: 24 mg/dL — ABNORMAL HIGH (ref 6–20)
CALCIUM: 8.7 mg/dL — AB (ref 8.9–10.3)
CO2: 27 mmol/L (ref 22–32)
Chloride: 103 mmol/L (ref 101–111)
Creatinine, Ser: 0.78 mg/dL (ref 0.44–1.00)
GFR calc Af Amer: >60 mL/min (ref >60)
GLUCOSE: 89 mg/dL (ref 65–99)
POTASSIUM: 4.3 mmol/L (ref 3.5–5.1)
SODIUM: 137 mmol/L (ref 135–145)

## 2016-04-02 LAB — CBC WITH DIFFERENTIAL/PLATELET
BASOS ABS: 0 10*3/uL (ref 0.0–0.1)
BASOS PCT: 0 %
EOS PCT: 0 %
Eosinophils Absolute: 0 10*3/uL (ref 0.0–0.7)
HEMATOCRIT: 41.1 % (ref 36.0–46.0)
Hemoglobin: 13.7 g/dL (ref 12.0–15.0)
LYMPHS PCT: 18 %
Lymphs Abs: 1.7 10*3/uL (ref 0.7–4.0)
MCH: 30.3 pg (ref 26.0–34.0)
MCHC: 33.3 g/dL (ref 30.0–36.0)
MCV: 90.9 fL (ref 78.0–100.0)
MONO ABS: 0.7 10*3/uL (ref 0.1–1.0)
Monocytes Relative: 7 %
NEUTROS ABS: 7.2 10*3/uL (ref 1.7–7.7)
Neutrophils Relative %: 75 %
PLATELETS: 197 10*3/uL (ref 150–400)
RBC: 4.52 MIL/uL (ref 3.87–5.11)
RDW: 14.3 % (ref 11.5–15.5)
WBC: 9.6 10*3/uL (ref 4.0–10.5)

## 2016-04-02 MED ORDER — FA-PYRIDOXINE-CYANOCOBALAMIN 2.5-25-2 MG PO TABS
1.0000 | ORAL_TABLET | Freq: Every day | ORAL | Status: DC
  Administered 2016-04-03 – 2016-04-05 (×3): 1 via ORAL
  Filled 2016-04-02 (×3): qty 1

## 2016-04-02 MED ORDER — CALCIUM CARBONATE-VITAMIN D 600-400 MG-UNIT PO TABS: 1.0000 | ORAL_TABLET | Freq: Every day | ORAL | Status: DC

## 2016-04-02 MED ORDER — ENOXAPARIN SODIUM 30 MG/0.3ML ~~LOC~~ SOLN
30.0000 mg | Freq: Every day | SUBCUTANEOUS | Status: DC
  Administered 2016-04-03 (×2): 30 mg via SUBCUTANEOUS
  Filled 2016-04-02 (×3): qty 0.3

## 2016-04-02 MED ORDER — ACETAMINOPHEN 325 MG PO TABS
650.0000 mg | ORAL_TABLET | Freq: Four times a day (QID) | ORAL | Status: DC | PRN
Start: 2016-04-02 — End: 2016-04-05

## 2016-04-02 MED ORDER — CALCIUM CARBONATE-VITAMIN D 500-200 MG-UNIT PO TABS
1.0000 | ORAL_TABLET | Freq: Every day | ORAL | Status: DC
  Administered 2016-04-03 – 2016-04-05 (×3): 1 via ORAL
  Filled 2016-04-02 (×3): qty 1

## 2016-04-02 MED ORDER — ZOLPIDEM TARTRATE 5 MG PO TABS: 5.0000 mg | ORAL_TABLET | Freq: Every evening | ORAL | Status: DC | PRN

## 2016-04-02 MED ORDER — LOPERAMIDE HCL 2 MG PO CAPS: 2.0000 mg | ORAL_CAPSULE | ORAL | Status: DC | PRN

## 2016-04-02 MED ORDER — ONDANSETRON HCL 4 MG/2ML IJ SOLN: 4.0000 mg | Freq: Three times a day (TID) | INTRAMUSCULAR | Status: DC | PRN

## 2016-04-02 MED ORDER — IBUPROFEN 200 MG PO TABS: 200.0000 mg | ORAL_TABLET | Freq: Four times a day (QID) | ORAL | Status: DC | PRN

## 2016-04-02 MED ORDER — OXYCODONE-ACETAMINOPHEN 5-325 MG PO TABS
1.0000 | ORAL_TABLET | ORAL | Status: DC | PRN
  Administered 2016-04-03 (×2): 1 via ORAL
  Filled 2016-04-02 (×2): qty 1

## 2016-04-02 MED ORDER — POLYETHYL GLYCOL-PROPYL GLYCOL 0.4-0.3 % OP GEL
1.0000 | Freq: Every day | OPHTHALMIC | Status: DC
Start: 2016-04-03 — End: 2016-04-03

## 2016-04-02 MED ORDER — POLYVINYL ALCOHOL 1.4 % OP SOLN
1.0000 [drp] | Freq: Three times a day (TID) | OPHTHALMIC | Status: DC
  Administered 2016-04-03 – 2016-04-05 (×6): 1 [drp] via OPHTHALMIC
  Filled 2016-04-02: qty 15

## 2016-04-02 MED ORDER — ACETAMINOPHEN 650 MG RE SUPP: 650.0000 mg | Freq: Four times a day (QID) | RECTAL | Status: DC | PRN

## 2016-04-02 MED ORDER — VITAMIN C 500 MG PO TABS
1000.0000 mg | ORAL_TABLET | Freq: Every day | ORAL | Status: DC
  Administered 2016-04-03 – 2016-04-05 (×3): 1000 mg via ORAL
  Filled 2016-04-02 (×3): qty 2

## 2016-04-02 MED ORDER — GADOBENATE DIMEGLUMINE 529 MG/ML IV SOLN
8.0000 mL | Freq: Once | INTRAVENOUS | Status: AC | PRN
  Administered 2016-04-02: 8 mL via INTRAVENOUS

## 2016-04-02 MED ORDER — ASPIRIN EC 81 MG PO TBEC
81.0000 mg | DELAYED_RELEASE_TABLET | Freq: Every day | ORAL | Status: DC
  Administered 2016-04-03 – 2016-04-05 (×3): 81 mg via ORAL
  Filled 2016-04-02 (×3): qty 1

## 2016-04-02 MED ORDER — PROSIGHT PO TABS
1.0000 | ORAL_TABLET | Freq: Every day | ORAL | Status: DC
  Administered 2016-04-03 – 2016-04-05 (×3): 1 via ORAL
  Filled 2016-04-02 (×3): qty 1

## 2016-04-02 NOTE — Progress Notes (Signed)
CSW received call from [place] with [facility] requesting if patient would need a skilled nursing bed once discharged. CSW will contact RN for follow up.  Kingsley Spittle, LCSWA Clinical Social Worker [phone]

## 2016-04-02 NOTE — ED Notes (Signed)
Bed: [facility] Expected date:  Expected time:  Means of arrival:  Comments: EMS-fall 

## 2016-04-02 NOTE — Progress Notes (Signed)
CSW spoke with patient regarding discharge plans. Patient is agreeable to discharge to [facility]. CSW will update facility once patient is medically stable for discharge.   Kingsley Spittle, LCSWA Clinical Social Worker [phone]

## 2016-04-02 NOTE — Progress Notes (Signed)
CSW updated [place] that patient would be admitted.   Kingsley Spittle, LCSWA Clinical Social Worker [phone]
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
## 2016-04-03 LAB — CBC
HCT: 36.7 % (ref 36.0–46.0)
HEMOGLOBIN: 12.4 g/dL (ref 12.0–15.0)
MCH: 30.8 pg (ref 26.0–34.0)
MCHC: 33.8 g/dL (ref 30.0–36.0)
MCV: 91.1 fL (ref 78.0–100.0)
Platelets: 164 10*3/uL (ref 150–400)
RBC: 4.03 MIL/uL (ref 3.87–5.11)
RDW: 14.3 % (ref 11.5–15.5)
WBC: 7 10*3/uL (ref 4.0–10.5)

## 2016-04-03 LAB — URINALYSIS, ROUTINE W REFLEX MICROSCOPIC
Bilirubin Urine: NEGATIVE
Glucose, UA: 50 mg/dL — AB
Hgb urine dipstick: NEGATIVE
Ketones, ur: NEGATIVE mg/dL
Leukocytes, UA: NEGATIVE
Nitrite: NEGATIVE
Protein, ur: NEGATIVE mg/dL
SPECIFIC GRAVITY, URINE: 1.005 (ref 1.005–1.030)
pH: 7 (ref 5.0–8.0)

## 2016-04-03 LAB — BASIC METABOLIC PANEL
ANION GAP: 6 (ref 5–15)
BUN: 20 mg/dL (ref 6–20)
CALCIUM: 8.5 mg/dL — AB (ref 8.9–10.3)
CO2: 26 mmol/L (ref 22–32)
Chloride: 102 mmol/L (ref 101–111)
Creatinine, Ser: 0.66 mg/dL (ref 0.44–1.00)
Glucose, Bld: 97 mg/dL (ref 65–99)
Potassium: 4.1 mmol/L (ref 3.5–5.1)
Sodium: 134 mmol/L — ABNORMAL LOW (ref 135–145)

## 2016-04-03 MED ORDER — LEVETIRACETAM 250 MG PO TABS
250.0000 mg | ORAL_TABLET | Freq: Two times a day (BID) | ORAL | Status: DC
  Administered 2016-04-03 – 2016-04-05 (×3): 250 mg via ORAL
  Filled 2016-04-03 (×5): qty 1

## 2016-04-03 MED ORDER — LIP MEDEX EX OINT
TOPICAL_OINTMENT | CUTANEOUS | Status: AC
  Administered 2016-04-03: 10:00:00
  Filled 2016-04-03: qty 7

## 2016-04-03 MED ORDER — LEVETIRACETAM 500 MG PO TABS
500.0000 mg | ORAL_TABLET | Freq: Two times a day (BID) | ORAL | Status: DC
  Administered 2016-04-03: 500 mg via ORAL
  Filled 2016-04-03: qty 1
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
## 2016-04-03 NOTE — Progress Notes (Signed)
Lovenox per Pharmacy for DVT Prophylaxis    Pharmacy has been consulted from dosing enoxaparin (lovenox) in this patient for DVT prophylaxis.  The pharmacist has reviewed pertinent labs (Hgb _13.7__; PLT_197__), patient weight (_44.9__kg) and renal function (CrCl_32.5__mL/min) and decided that enoxaparin _30_mg SQ Q_24_Hrs is appropriate for this patient.  The pharmacy department will sign off at this time.  Please reconsult pharmacy if status changes or for further issues.  Thank you  Cyndia Diver PharmD, BCPS  04/03/2016, 1:20 AM

## 2016-04-03 NOTE — NC FL2 (Signed)
[place] LEVEL OF CARE SCREENING TOOL     IDENTIFICATION  Patient Name: [name] Birthdate: February 19, 1923 Sex: female Admission Date (Current Location): 04/02/2016  [facility] and [place] Number:  Herbalist and Address:  [facility],  [place] [address], [place]      Provider Number: [id number]  Attending Physician Name and Address:  Eugenie Filler, MD  Relative Name and Phone Number:       Current Level of Care: Hospital Recommended Level of Care: [place] Prior Approval Number:    Date Approved/Denied:   PASRR Number:   [id number]   Discharge Plan: SNF    Current Diagnoses: Patient Active Problem List   Diagnosis Date Noted  . Pelvic fracture ([place]) 04/02/2016  . Closed fracture of left distal radius 04/02/2016  . Brain metastases ([place])   . Fracture of multiple pubic rami, right, closed, initial encounter ([place])   . Slurred speech   . Cerebral atrophy 03/28/2016  . Cerebrovascular disease 03/28/2016  . Irritation of right eye 12/29/2015  . Dizziness and giddiness 12/14/2015  . Low back pain 08/17/2015  . Fall 02/09/2015  . Right knee pain 02/08/2015  . Closed displaced transverse fracture of right patella 02/03/2015  . Displaced transverse fracture of right patella 02/03/2015  . Closed fracture of right patella 01/16/2015  . Recurrent UTI 01/02/2015  . Carotid atherosclerosis 06/09/2014  . Hyperglycemia 06/09/2014  . Multiple pulmonary nodules 04/01/2014  . Hearing loss 01/20/2014  . Insomnia 02/08/2013  . Left shoulder pain 02/08/2013  . History of amputation of lesser toe of right foot ([place]) 01/20/2013  . TIA (transient ischemic attack) 01/13/2013  . Tibial plateau fracture 01/13/2013  . Frequent loose stools 07/01/2012  . GERD (gastroesophageal reflux disease) 03/21/2011  . Squamous cell skin cancer 03/21/2011  . Urine, incontinence, stress female 03/21/2011  . Osteoarthritis, hand  03/21/2011    Orientation RESPIRATION BLADDER Height & Weight     Self, Time, Situation, Place  Normal Continent Weight:   Height:     BEHAVIORAL SYMPTOMS/MOOD NEUROLOGICAL BOWEL NUTRITION STATUS      Continent Diet (regular)  AMBULATORY STATUS COMMUNICATION OF NEEDS Skin   Limited Assist Verbally Normal                       Personal Care Assistance Level of Assistance  Bathing, Dressing Bathing Assistance: Limited assistance   Dressing Assistance: Limited assistance     Functional Limitations Info  Hearing (Hard of Hearing)   Hearing Info: Impaired      SPECIAL CARE FACTORS FREQUENCY  PT (By licensed PT)     PT Frequency: 5 OT Frequency: 5            Contractures      Additional Factors Info  Code Status, Allergies Code Status Info: DNR Allergies Info: MACROBID NITROFURANTOIN MONOHYD MACRO, PENICILLIN G, SEPTRA BACTRIM, PENICILLINS, SEPTRA SULFAMETHOXAZOLE-TRIMETHOPRIM            Current Medications (04/03/2016):  This is the current hospital active medication list Current Facility-Administered Medications  Medication Dose Route Frequency Provider Last Rate Last Dose  . acetaminophen (TYLENOL) tablet 650 mg  650 mg Oral Q6H PRN Ivor Costa, MD       Or  . acetaminophen (TYLENOL) suppository 650 mg  650 mg Rectal Q6H PRN Ivor Costa, MD      . aspirin EC tablet 81 mg  81 mg Oral Daily Ivor Costa, MD  81 mg at 04/03/16 0851  . calcium-vitamin D (OSCAL WITH D) 500-200 MG-UNIT per tablet 1 tablet  1 tablet Oral Daily Ivor Costa, MD   1 tablet at 04/03/16 [phone]  . enoxaparin (LOVENOX) injection 30 mg  30 mg Subcutaneous QHS Ivor Costa, MD   30 mg at 04/03/16 0159  . folic acid-pyridoxine-cyancobalamin (FOLTX) 2.5-25-2 MG per tablet 1 tablet  1 tablet Oral Daily Ivor Costa, MD   1 tablet at 04/03/16 [phone]  . ibuprofen (ADVIL,MOTRIN) tablet 200 mg  200 mg Oral Q6H PRN Ivor Costa, MD      . levETIRAcetam (KEPPRA) tablet 500 mg  500 mg Oral BID Eugenie Filler, MD    500 mg at 04/03/16 0955  . loperamide (IMODIUM) capsule 2 mg  2 mg Oral PRN Ivor Costa, MD      . multivitamin (PROSIGHT) tablet 1 tablet  1 tablet Oral Daily Ivor Costa, MD   1 tablet at 04/03/16 [phone]  . ondansetron (ZOFRAN) injection 4 mg  4 mg Intravenous Q8H PRN Ivor Costa, MD      . oxyCODONE-acetaminophen (PERCOCET/ROXICET) 5-325 MG per tablet 1 tablet  1 tablet Oral Q4H PRN Ivor Costa, MD   1 tablet at 04/03/16 0243  . polyvinyl alcohol (LIQUIFILM TEARS) 1.4 % ophthalmic solution 1 drop  1 drop Both Eyes TID Ivor Costa, MD   1 drop at 04/03/16 1624  . vitamin C (ASCORBIC ACID) tablet 1,000 mg  1,000 mg Oral Daily Ivor Costa, MD   1,000 mg at 04/03/16 [id number]  . zolpidem (AMBIEN) tablet 5 mg  5 mg Oral QHS PRN Ivor Costa, MD         Discharge Medications: Please see discharge summary for a list of discharge medications.  Relevant Imaging Results:  Relevant Lab Results:   Additional Information SS#: [id number]  Lia Hopping, LCSW

## 2016-04-03 NOTE — ED Notes (Signed)
Attempted to call report at this time. RN on [place] unavailable and to return call to this RN.

## 2016-04-03 NOTE — Evaluation (Signed)
Physical Therapy Evaluation Patient Details Name: [name] MRN: [id number] DOB: 1922/07/30 Today's Date: 04/03/2016   History of Present Illness  (P) 82 yo female admitted after falling at [employer]. Pt sustained L distal radius fx, L sup/inf pubic rami fractures. Imaging + for cancer, brain mets  Clinical Impression  On eval, pt required Mod assist for mobility. She was able to tolerate squat pivot x 2 (to strong side) with use of armrest. Pt reported mod-severe pain with WBing on L LE. Assisted pt into recliner to sit up as tolerated. Recommend SNF.     Follow Up Recommendations SNF    Equipment Recommendations       Recommendations for Other Services OT consult     Precautions / Restrictions Precautions Precautions:  Fall Restrictions Weight Bearing Restrictions: Yes LUE Weight Bearing:  Non weight bearing Other Position/Activity Restrictions:  L UE in splint and sling      Mobility  Bed Mobility Overal bed mobility: Needs Assistance Bed Mobility: Supine to Sit     Supine to sit: Min guard     General bed mobility comments: close guard for safety.   Transfers Overall transfer level: Needs assistance Equipment used: None Transfers: Squat Pivot Transfers     Squat pivot transfers: Mod assist     General transfer comment: x2 (both towards R side). Assist to rise, stabilize. Cues for safety, technique.   Ambulation/Gait             General Gait Details: NT-pt unable  Stairs            Wheelchair Mobility    Modified Rankin (Stroke Patients Only)       Balance Overall balance assessment: Needs assistance;History of Falls           Standing balance-Leahy Scale: Poor Standing balance comment: requires UE support and external assist                             Pertinent Vitals/Pain Pain Assessment: Faces Faces Pain Scale: Hurts whole lot Pain Location:  L LE with WBing Pain Descriptors / Indicators:  Discomfort;Guarding;Sharp;Sore Pain Intervention(s): Limited activity within patient's tolerance;Repositioned    Home Living Family/patient expects to be discharged to:: Skilled nursing facility Living Arrangements: Alone   Type of Home: Apartment       Home Layout: One level Home Equipment: None      Prior Function Level of Independence: Independent               Hand Dominance        Extremity/Trunk Assessment   Upper Extremity Assessment Upper Extremity Assessment: Defer to OT evaluation LUE Deficits / Details: (P) immobilized:  able to wiggle fingers    Lower Extremity Assessment Lower Extremity Assessment: LLE deficits/detail LLE: Unable to fully assess due to pain    Cervical / Trunk Assessment Cervical / Trunk Assessment: Kyphotic  Communication   Communication: HOH  Cognition Arousal/Alertness: Awake/alert Behavior During Therapy: WFL for tasks assessed/performed Overall Cognitive Status: Within Functional Limits for tasks assessed                      General Comments      Exercises     Assessment/Plan    PT Assessment Patient needs continued PT services  PT Problem List Decreased mobility;Decreased balance;Decreased activity tolerance;Decreased range of motion;Decreased strength;Pain;Decreased knowledge of use of DME  PT Treatment Interventions Therapeutic activities;Therapeutic exercise;Patient/family education;Balance training;Functional mobility training;DME instruction    PT Goals (Current goals can be found in the Care Plan section)  Acute Rehab PT Goals Patient Stated Goal: less pain PT Goal Formulation: With patient Time For Goal Achievement: 04/17/16 Potential to Achieve Goals: Good    Frequency Min 3X/week   Barriers to discharge        Co-evaluation               End of Session   Activity Tolerance: Patient limited by pain Patient left: in chair;with call bell/phone within reach            Time: [id number] PT Time Calculation (min) (ACUTE ONLY): 21 min   Charges:   PT Evaluation $PT Eval Low Complexity: 1 Procedure     PT G Codes:        Weston Anna, MPT Pager: [phone]

## 2016-04-03 NOTE — ED Notes (Signed)
Pt states that though her LUE and pelvis are hurting, they are tolerable and she would prefer to wait to take her PO analgesic when she is admitted upstairs. Pt informed that this RN is waiting to give report and that the RN on the floor is to call this RN back momentarily.

## 2016-04-03 NOTE — Evaluation (Addendum)
Occupational Therapy Evaluation Patient Details Name: [name] MRN: [id number] DOB: 1922-08-14 Today's Date: 04/03/2016    History of Present Illness 82 yo female admitted after falling at [employer]. Pt sustained L distal radius fx, L sup/inf pubic rami fractures. Imaging + for cancer, brain mets   Clinical Impression   Pt was admitted for the above.  She was independent prior to admission.  She currently needs mod A for SPT and max A for LB adls.  Pt is very motivated and worked through pain.  She did not want pain medication prior to therapy.  Goals in acute setting are for min A    Follow Up Recommendations  SNF    Equipment Recommendations  3 in 1 bedside commode    Recommendations for Other Services       Precautions / Restrictions Precautions Precautions: Fall Restrictions Weight Bearing Restrictions: Yes LUE Weight Bearing: Non weight bearing Other Position/Activity Restrictions: L UE in splint and sling      Mobility Bed Mobility Overal bed mobility: Needs Assistance Bed Mobility: Supine to Sit     Supine to sit: Min guard     General bed mobility comments: close guard for safety.   Transfers Overall transfer level: Needs assistance Equipment used: None Transfers: Stand Pivot Transfers;Squat Pivot Transfers     Squat pivot transfers: Mod assist     General transfer comment: x2 (both towards R side). Assist to rise, stabilize. Cues for safety, technique.     Balance Overall balance assessment: Needs assistance;History of Falls           Standing balance-Leahy Scale: Poor Standing balance comment: requires UE support and external assist                            ADL Overall ADL's : Needs assistance/impaired     Grooming: Set up;Sitting   Upper Body Bathing: Minimal assistance;Sitting   Lower Body Bathing: Moderate assistance;Sit to/from stand   Upper Body Dressing : Moderate assistance;Sitting   Lower  Body Dressing: Maximal assistance;Sit to/from stand   Toilet Transfer: Moderate assistance;Stand-pivot;BSC   Toileting- Clothing Manipulation and Hygiene: Maximal assistance;Sit to/from stand         General ADL Comments: transferred to [facility] then chair going to R side with cues for technique.  Pt is in a sling, but managed to wiggle arm out:  assisted back into into it. Cues for NWB     Vision     Perception     Praxis      Pertinent Vitals/Pain Pain Assessment: Faces Faces Pain Scale: Hurts whole lot Pain Location:  L LE with WBing Pain Descriptors / Indicators: Discomfort;Guarding;Sharp;Sore Pain Intervention(s): Limited activity within patient's tolerance;Repositioned     Hand Dominance     Extremity/Trunk Assessment Upper Extremity Assessment Upper Extremity Assessment: Defer to OT evaluation LUE Deficits / Details: immobilized:  able to wiggle fingers.  Educated to move fingers often (every 15 minutes) and keep arm positioned up, like a ramp, to control edema.  Positioned at end of session.      Cervical / Trunk Assessment Cervical / Trunk Assessment: Kyphotic   Communication Communication Communication: HOH   Cognition Arousal/Alertness: Awake/alert Behavior During Therapy: WFL for tasks assessed/performed Overall Cognitive Status: Within Functional Limits for tasks assessed                     General Comments  Exercises       Shoulder Instructions      Home Living Family/patient expects to be discharged to:: Skilled nursing facility Living Arrangements: Alone   Type of Home: Apartment       Home Layout: One level               Home Equipment: None          Prior Functioning/Environment Level of Independence: Independent                 OT Problem List: Decreased strength;Decreased activity tolerance;Decreased knowledge of use of DME or AE;Pain   OT Treatment/Interventions: Self-care/ADL training;DME and/or AE  instruction;Patient/family education    OT Goals(Current goals can be found in the care plan section) Acute Rehab OT Goals Patient Stated Goal: less pain OT Goal Formulation: With patient Time For Goal Achievement: 04/10/16 Potential to Achieve Goals: Good ADL Goals Pt Will Perform Lower Body Bathing: with min assist;with adaptive equipment;sit to/from stand Pt Will Perform Lower Body Dressing: with min assist;with adaptive equipment;sit to/from stand (underwear) Pt Will Transfer to Toilet: with min assist;stand pivot transfer;bedside commode;squat pivot transfer (to both sides) Pt Will Perform Toileting - Clothing Manipulation and hygiene: with min assist;sit to/from stand;sitting/lateral leans  OT Frequency: Min 2X/week   Barriers to D/C:            Co-evaluation PT/OT/SLP Co-Evaluation/Treatment: Yes Reason for Co-Treatment: For patient/therapist safety PT goals addressed during session: Mobility/safety with mobility OT goals addressed during session: ADL's and self-care      End of Session    Activity Tolerance: Patient tolerated treatment well Patient left: in chair;with call bell/phone within reach   Time: [id number] OT Time Calculation (min): 21 min Charges:  OT General Charges $OT Visit: 1 Procedure OT Evaluation $OT Eval Low Complexity: 1 Procedure G-Codes:    Abena Erdman 04/24/2016, 2:32 PM Lesle Chris, OTR/L [phone] 24-Apr-2016
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
## 2016-04-04 DIAGNOSIS — W19XXXD Unspecified fall, subsequent encounter: Secondary | ICD-10-CM

## 2016-04-04 DIAGNOSIS — Z515 Encounter for palliative care: Secondary | ICD-10-CM

## 2016-04-04 DIAGNOSIS — Z7189 Other specified counseling: Secondary | ICD-10-CM

## 2016-04-04 LAB — CBC
HEMATOCRIT: 35.9 % — AB (ref 36.0–46.0)
Hemoglobin: 12.6 g/dL (ref 12.0–15.0)
MCH: 30.6 pg (ref 26.0–34.0)
MCHC: 35.1 g/dL (ref 30.0–36.0)
MCV: 87.1 fL (ref 78.0–100.0)
Platelets: 139 10*3/uL — ABNORMAL LOW (ref 150–400)
RBC: 4.12 MIL/uL (ref 3.87–5.11)
RDW: 13.9 % (ref 11.5–15.5)
WBC: 8.3 10*3/uL (ref 4.0–10.5)

## 2016-04-04 LAB — BASIC METABOLIC PANEL
Anion gap: 7 (ref 5–15)
BUN: 16 mg/dL (ref 6–20)
CALCIUM: 8.6 mg/dL — AB (ref 8.9–10.3)
CO2: 26 mmol/L (ref 22–32)
Chloride: 100 mmol/L — ABNORMAL LOW (ref 101–111)
Creatinine, Ser: 0.63 mg/dL (ref 0.44–1.00)
GFR calc Af Amer: >60 mL/min (ref >60)
GLUCOSE: 104 mg/dL — AB (ref 65–99)
Potassium: 4.4 mmol/L (ref 3.5–5.1)
Sodium: 133 mmol/L — ABNORMAL LOW (ref 135–145)

## 2016-04-04 MED ORDER — CEPHALEXIN 500 MG PO CAPS
500.0000 mg | ORAL_CAPSULE | Freq: Three times a day (TID) | ORAL | Status: DC
  Administered 2016-04-04 – 2016-04-05 (×2): 500 mg via ORAL
  Filled 2016-04-04 (×2): qty 1
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
## 2016-04-05 DIAGNOSIS — R2689 Other abnormalities of gait and mobility: Secondary | ICD-10-CM | POA: Diagnosis not present

## 2016-04-05 DIAGNOSIS — S52502A Unspecified fracture of the lower end of left radius, initial encounter for closed fracture: Secondary | ICD-10-CM | POA: Diagnosis not present

## 2016-04-05 DIAGNOSIS — Z4789 Encounter for other orthopedic aftercare: Secondary | ICD-10-CM | POA: Diagnosis not present

## 2016-04-05 DIAGNOSIS — R4781 Slurred speech: Secondary | ICD-10-CM | POA: Diagnosis not present

## 2016-04-05 DIAGNOSIS — C4492 Squamous cell carcinoma of skin, unspecified: Secondary | ICD-10-CM

## 2016-04-05 DIAGNOSIS — S32511D Fracture of superior rim of right pubis, subsequent encounter for fracture with routine healing: Secondary | ICD-10-CM

## 2016-04-05 DIAGNOSIS — S52502D Unspecified fracture of the lower end of left radius, subsequent encounter for closed fracture with routine healing: Secondary | ICD-10-CM

## 2016-04-05 DIAGNOSIS — S32810A Multiple fractures of pelvis with stable disruption of pelvic ring, initial encounter for closed fracture: Secondary | ICD-10-CM | POA: Diagnosis not present

## 2016-04-05 DIAGNOSIS — M6281 Muscle weakness (generalized): Secondary | ICD-10-CM | POA: Diagnosis not present

## 2016-04-05 DIAGNOSIS — R278 Other lack of coordination: Secondary | ICD-10-CM | POA: Diagnosis not present

## 2016-04-05 DIAGNOSIS — S42309A Unspecified fracture of shaft of humerus, unspecified arm, initial encounter for closed fracture: Secondary | ICD-10-CM | POA: Diagnosis not present

## 2016-04-05 DIAGNOSIS — M79602 Pain in left arm: Secondary | ICD-10-CM | POA: Diagnosis not present

## 2016-04-05 DIAGNOSIS — C7931 Secondary malignant neoplasm of brain: Principal | ICD-10-CM

## 2016-04-05 DIAGNOSIS — S329XXA Fracture of unspecified parts of lumbosacral spine and pelvis, initial encounter for closed fracture: Secondary | ICD-10-CM | POA: Diagnosis not present

## 2016-04-05 DIAGNOSIS — S32591A Other specified fracture of right pubis, initial encounter for closed fracture: Secondary | ICD-10-CM | POA: Diagnosis not present

## 2016-04-05 DIAGNOSIS — M79605 Pain in left leg: Secondary | ICD-10-CM | POA: Diagnosis not present

## 2016-04-05 DIAGNOSIS — F5101 Primary insomnia: Secondary | ICD-10-CM | POA: Diagnosis not present

## 2016-04-05 DIAGNOSIS — R918 Other nonspecific abnormal finding of lung field: Secondary | ICD-10-CM | POA: Diagnosis not present

## 2016-04-05 LAB — CBC
HCT: 35.2 % — ABNORMAL LOW (ref 36.0–46.0)
HEMOGLOBIN: 12.3 g/dL (ref 12.0–15.0)
MCH: 31.1 pg (ref 26.0–34.0)
MCHC: 34.9 g/dL (ref 30.0–36.0)
MCV: 88.9 fL (ref 78.0–100.0)
PLATELETS: 161 10*3/uL (ref 150–400)
RBC: 3.96 MIL/uL (ref 3.87–5.11)
RDW: 14.1 % (ref 11.5–15.5)
WBC: 7.6 10*3/uL (ref 4.0–10.5)

## 2016-04-05 LAB — BASIC METABOLIC PANEL
Anion gap: 7 (ref 5–15)
BUN: 15 mg/dL (ref 6–20)
CALCIUM: 8.2 mg/dL — AB (ref 8.9–10.3)
CHLORIDE: 100 mmol/L — AB (ref 101–111)
CO2: 23 mmol/L (ref 22–32)
CREATININE: 0.57 mg/dL (ref 0.44–1.00)
GFR calc non Af Amer: >60 mL/min (ref >60)
Glucose, Bld: 99 mg/dL (ref 65–99)
Potassium: 4.2 mmol/L (ref 3.5–5.1)
SODIUM: 130 mmol/L — AB (ref 135–145)

## 2016-04-05 LAB — URINE CULTURE

## 2016-04-05 MED ORDER — LEVETIRACETAM 250 MG PO TABS: 250.0000 mg | ORAL_TABLET | Freq: Two times a day (BID) | ORAL | 0 refills | Status: AC

## 2016-04-05 MED ORDER — CEPHALEXIN 500 MG PO CAPS: 500.0000 mg | ORAL_CAPSULE | Freq: Three times a day (TID) | ORAL | 0 refills | Status: DC
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
## 2016-04-05 NOTE — Clinical Social Work Placement (Signed)
   CLINICAL SOCIAL WORK PLACEMENT  NOTE  Date:  04/05/2016  Patient Details  Name: [name] MRN: [id number] Date of Birth: Oct 25, 1922  Clinical Social Work is seeking post-discharge placement for this patient at the [facility] level of care (*CSW will initial, date and re-position this form in  chart as items are completed):  No   Patient/family provided with [facility] Work Department's list of facilities offering this level of care within the geographic area requested by the patient (or if unable, by the patient's family).  No   Patient/family informed of their freedom to choose among providers that offer the needed level of care, that participate in Medicare, Medicaid or managed care program needed by the patient, have an available bed and are willing to accept the patient.  No   Patient/family informed of [facility]'s ownership interest in [facility] and [facility], as well as of the fact that they are under no obligation to receive care at these facilities.  PASRR submitted to EDS on       PASRR number received on       Existing PASRR number confirmed on 04/05/16     FL2 transmitted to all facilities in geographic area requested by pt/family on       FL2 transmitted to all facilities within larger geographic area on       Patient informed that his/her managed care company has contracts with or will negotiate with certain facilities, including the following:  [facility]     Yes   Patient/family informed of bed offers received.  Patient chooses bed at [facility]     Physician recommends and patient chooses bed at [facility]    Patient to be transferred to [facility] on 04/05/16.  Patient to be transferred to facility by       Patient family notified on 04/05/16 of transfer.  Name of family member notified:  Facility Staff-Katie     PHYSICIAN Please prepare priority discharge  summary, including medications, Please sign DNR     Additional Comment:    _______________________________________________ Lia Hopping, LCSW 04/05/2016, 10:20 AM

## 2016-04-05 NOTE — Progress Notes (Signed)
Occupational Therapy Treatment Patient Details Name: [name] MRN: [id number] DOB: [birth year] Today's Date: 04/05/2016    History of present illness 82 yo female admitted after falling at [employer]. Pt sustained L distal radius fx, L sup/inf pubic rami fractures. Imaging + for cancer, brain mets   OT comments  Assisted pt with LB dressing.  She had recently used [facility].  She plans d/c today  Follow Up Recommendations  SNF    Equipment Recommendations  3 in 1 bedside commode    Recommendations for Other Services      Precautions / Restrictions Precautions Precautions: Fall Restrictions LUE Weight Bearing: Non weight bearing LLE Weight Bearing: Non weight bearing Other Position/Activity Restrictions: LUE now casted. She did not attempt to put weight through it when standing       Mobility Bed Mobility               General bed mobility comments: oob  Transfers   Equipment used: 1 person hand held assist   Sit to Stand: Min assist;Mod assist         General transfer comment: assist to rise and stabilize    Balance                                   ADL                       Lower Body Dressing: Sit to/from stand;Moderate assistance (underwear)                 General ADL Comments: donned mesh underwear, sit to stand.  Pt is familiar with reacher, but it is not easy to use with this garment: simulated reacher.  Pt is R handed      Vision                     Perception     Praxis      Cognition   Behavior During Therapy: WFL for tasks assessed/performed Overall Cognitive Status: Within Functional Limits for tasks assessed                       Extremity/Trunk Assessment               Exercises     Shoulder Instructions       General Comments      Pertinent Vitals/ Pain       Pain Assessment: Faces Faces Pain Scale: Hurts even more Pain Location: LLE and L  forearm Pain Descriptors / Indicators: Aching Pain Intervention(s): Limited activity within patient's tolerance;Monitored during session;Premedicated before session;Repositioned  Home Living                                          Prior Functioning/Environment              Frequency           Progress Toward Goals  OT Goals(current goals can now be found in the care plan section)  Progress towards OT goals: Progressing toward goals     Plan      Co-evaluation                 End of Session     Activity  Tolerance Patient tolerated treatment well   Patient Left in chair;with call bell/phone within reach;with chair alarm set   Nurse Communication          Time: [phone] OT Time Calculation (min): 13 min  Charges: OT General Charges $OT Visit: 1 Procedure OT Treatments $Self Care/Home Management : 8-22 mins  Raylan Troiani 04/05/2016, 12:51 PM  Lesle Chris, OTR/L [phone] 04/05/2016
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
## 2016-04-05 NOTE — Progress Notes (Signed)
D/C Summary faxed  Nurse Given report # [phone] called for transport.

## 2016-04-05 NOTE — Progress Notes (Signed)
Date: April 05, 2016 Discharge orders checked for needs. No case management needs present at time of discharge. Franziska Podgurski, RN, BSN, CCM   [phone] 

## 2016-04-08 ENCOUNTER — Encounter: Payer: Self-pay | Admitting: Internal Medicine

## 2016-04-08 ENCOUNTER — Telehealth: Payer: Self-pay

## 2016-04-08 ENCOUNTER — Non-Acute Institutional Stay (SKILLED_NURSING_FACILITY): Payer: Medicare Other | Admitting: Internal Medicine

## 2016-04-08 DIAGNOSIS — R4781 Slurred speech: Secondary | ICD-10-CM | POA: Diagnosis not present

## 2016-04-08 DIAGNOSIS — C4492 Squamous cell carcinoma of skin, unspecified: Secondary | ICD-10-CM | POA: Diagnosis not present

## 2016-04-08 DIAGNOSIS — C7931 Secondary malignant neoplasm of brain: Secondary | ICD-10-CM | POA: Diagnosis not present

## 2016-04-08 DIAGNOSIS — R918 Other nonspecific abnormal finding of lung field: Secondary | ICD-10-CM

## 2016-04-08 DIAGNOSIS — S32591A Other specified fracture of right pubis, initial encounter for closed fracture: Secondary | ICD-10-CM | POA: Diagnosis not present

## 2016-04-08 DIAGNOSIS — S52502D Unspecified fracture of the lower end of left radius, subsequent encounter for closed fracture with routine healing: Secondary | ICD-10-CM | POA: Diagnosis not present

## 2016-04-08 NOTE — Telephone Encounter (Signed)
Possible re-admission to facility. This is a patient you were seeing at [facility] F/U is needed if patient was re-admitted to facility upon discharge. Hospital discharge from [place] on 04/05/16.

## 2016-04-08 NOTE — Progress Notes (Signed)
History and Physical     Location:  [facility] Room Number: N51 Place of Service:  SNF (31)  PCP: Jeanmarie Hubert, MD Patient Care Team: Estill Dooms, MD as PCP - General (Internal Medicine) Dorna Leitz, MD as Consulting Physician (Orthopedic Surgery) Carol Ada, MD as Consulting Physician (Gastroenterology) Crista Luria, MD as Consulting Physician (Dermatology) [facility] as Audiologist (Audiology) Man Otho Darner, NP as Nurse Practitioner (Nurse Practitioner) Tanda Rockers, MD as Consulting Physician (Pulmonary Disease) Rutherford Guys, MD as Consulting Physician (Ophthalmology)  Extended Emergency Contact Information Primary Emergency Contact: Aron Baba [place] of [place] Phone: [phone] Relation: Friend  Code Status: DNR Goals of Care: Advanced Directive information Advanced Directives 04/08/2016  Does Patient Have a Medical Advance Directive? Yes  Type of Advance Directive Living will;Out of facility DNR (pink MOST or yellow form)  Does patient want to make changes to medical advance directive? -  Copy of [place] in Chart? -  Pre-existing out of facility DNR order (yellow form or pink MOST form) Yellow form placed in chart (order not valid for inpatient use)      Chief Complaint  Patient presents with  . New Admit To SNF    following hospitalization 04/02/16 to 04/05/16 following a fall, pelvic and left wrist fracture.     HPI: Patient is a 82 y.o. female seen today for admission to [facility] SNF on 04/05/16 following hospitalization from 04/02/16 to 04/05/16.  She stated she was nearly hit by a Lucianne Lei that was backing up and then sustained a fall. Injuries included pelvic fracture and closed fx of the distal left radius.  Because of a history of intermiitent slurred speech, MRI of the brain was done. It showed multilple brain metastases and a cyst with some solid component. She had a  normal CT of the brain in Sept 2017 and had 2 other brain imaging studies since 2014., including MRI and MRA of the brain in 2014. Those studies showed only cerebral atrophy and cerebrovascular small vessel disease.  History from the hospital suggests brain mets may be associated with prior squamous cell skin cancers. There is a history of pulmonary nodules dating back to 2015. She has been seen by Dr. Melvyn Novas, pulmonologist, but she did not want to pursue tissue diagnosis at that time.  (03/15/14 CT chest: There is a lobulated nodule with spiculation in the right lower lobe on image 30 measuring 1.1 cm. A 1.2 cm nodule is seen in the left upper lobe on image 29. Small rim of calcification is seen eccentrically on the lateral side in this nodule. Calcified granulomata are also identified. There is some scarring in the Lingula. Incidentally imaged upper abdomen demonstrates aortic atherosclerosis but no focal abnormality. No lytic or sclerotic bony lesion is identified with advanced multilevel cervical spondylosis noted. IMPRESSION: Bilateral pulmonary nodules with a dominant nodules in the right lower and right upper lobes which could represent carcinoma. Recommend consultation with pulmonology or cardiac thoracic surgery.)  Patient is admitted into the SNF for rehabilitation for the fx pf the pelvis and left radius. She is to see Dr. Sudie Bailey about the radial fx.   She states she does not want anything more done about the new diagnosis of brain cancer. She has an appt with Dr. Tomi Likens on 06/08/15, [facility].  Past Medical History:  Diagnosis Date  . Allergy   . Arthritis   . Cancer ([place])  squamous cell  . Female stress incontinence   . Frequent loose stools   . GERD (gastroesophageal reflux disease)   . Incontinence of urine   . Insomnia   . Nocturia   . Oxygen deficiency   . Sepsis due to Escherichia coli (E. coli) ([place])   . Squamous cell carcinoma of skin   . Stroke [facility])     TIA  . TIA (transient ischemic attack) 01/05/13   Past Surgical History:  Procedure Laterality Date  . ABDOMINAL HYSTERECTOMY  1959  . APPENDECTOMY  1959  . BREAST SURGERY  1982   breast  . COLONOSCOPY  2013   Dr. Carol Ada  . ORIF PATELLA Right 02/03/2015   Procedure: OPEN REDUCTION INTERNAL (ORIF) FIXATION RIGHT PATELLA;  Surgeon: Dorna Leitz, MD;  Location: [facility];  Service: Orthopedics;  Laterality: Right;  . TEE WITHOUT CARDIOVERSION N/A 01/18/2013   Procedure: TRANSESOPHAGEAL ECHOCARDIOGRAM (TEE);  Surgeon: Lelon Perla, MD;  Location: [facility] ENDOSCOPY;  Service: Cardiovascular;  Laterality: N/A;  . TONSILLECTOMY  1940    reports that she quit smoking about 39 years ago. Her smoking use included Cigarettes. She has a 15.00 pack-year smoking history. She has never used smokeless tobacco. She reports that she does not drink alcohol or use drugs. Social History   Social History  . Marital status: Widowed    Spouse name: N/A  . Number of children: N/A  . Years of education: N/A   Occupational History  . Not on file.   Social History Main Topics  . Smoking status: Former Smoker    Packs/day: 1.00    Years: 15.00    Types: Cigarettes    Quit date: 03/20/1977  . Smokeless tobacco: Never Used  . Alcohol use No  . Drug use: No  . Sexual activity: No   Other Topics Concern  . Not on file   Social History Narrative   ** Merged History Encounter **       Lives at [facility] for 30 years stopped 1978 Alcohol none Exercise 3 days a week Living Will     Family History  Problem Relation Age of Onset  . Heart disease Mother     failure  . Cancer Sister     breast  . Cancer Father   . Cancer Son     lung    Health Maintenance  Topic Date Due  . ZOSTAVAX  05/29/1984  . DEXA SCAN  05/29/1989  . PNA vac Low Risk Adult (2 of 2 - PPSV23) 12/27/2010  . TETANUS/TDAP  01/14/2025  . INFLUENZA VACCINE  Completed    Allergies  Allergen  Reactions  . Macrobid [Nitrofurantoin Monohyd Macro] Nausea Only  . Penicillin G Itching    Can tolerate CEPHALOSPORINS Has patient had a PCN reaction causing immediate rash, facial/tongue/throat swelling, SOB or lightheadedness with hypotension: No Has patient had a PCN reaction causing severe rash involving mucus membranes or skin necrosis: No Has patient had a PCN reaction that required hospitalization No Has patient had a PCN reaction occurring within the last 10 years: No If all of the above answers are "NO", then may proceed with Cephalosporin use.  Sarina Ill [Bactrim]     Stomach concerns.     Allergies as of 04/08/2016      Reactions   Macrobid [nitrofurantoin Monohyd Macro] Nausea Only   Penicillin G Itching   Can tolerate CEPHALOSPORINS Has patient had a PCN reaction causing immediate rash, facial/tongue/throat swelling,  SOB or lightheadedness with hypotension: No Has patient had a PCN reaction causing severe rash involving mucus membranes or skin necrosis: No Has patient had a PCN reaction that required hospitalization No Has patient had a PCN reaction occurring within the last 10 years: No If all of the above answers are "NO", then may proceed with Cephalosporin use.   Septra [bactrim]    Stomach concerns.       Medication List       Accurate as of 04/08/16 10:17 AM. Always use your most recent med list.          aspirin EC 81 MG tablet Take 81 mg by mouth daily.   b complex vitamins tablet Take 1 tablet by mouth daily.   beta carotene w/minerals tablet Take 1 tablet by mouth daily.   OYSTER SHELL CALCIUM + D 500-200 MG-UNIT Tabs Generic drug:  Calcium Carb-Cholecalciferol Take 1 tablet by mouth daily.   CALCIUM 600 + D PO Take 1 tablet by mouth daily.   cephALEXin 500 MG capsule Commonly known as:  KEFLEX Take 1 capsule (500 mg total) by mouth every 8 (eight) hours.   ibuprofen 200 MG tablet Commonly known as:  ADVIL,MOTRIN Take 200 mg by mouth  every 6 (six) hours as needed for headache.   levETIRAcetam 250 MG tablet Commonly known as:  KEPPRA Take 1 tablet (250 mg total) by mouth 2 (two) times daily.   loperamide 2 MG capsule Commonly known as:  IMODIUM Take 2 mg by mouth as needed for diarrhea or loose stools.   SYSTANE ULTRA 0.4-0.3 % Soln Generic drug:  Polyethyl Glycol-Propyl Glycol Place 1 drop into both eyes 3 (three) times daily.   SYSTANE 0.4-0.3 % Gel ophthalmic gel Generic drug:  Polyethyl Glycol-Propyl Glycol Place 1 application into both eyes at bedtime.   vitamin C 1000 MG tablet Take 1,000 mg by mouth daily.       Review of Systems  Constitutional: Negative.  Negative for activity change, appetite change, chills, diaphoresis, fatigue, fever and unexpected weight change.  HENT: Positive for hearing loss and voice change (hoarse). Negative for congestion, ear discharge, ear pain, postnasal drip, rhinorrhea, sore throat, tinnitus and trouble swallowing.   Eyes: Negative for pain, redness, itching and visual disturbance.       Hx anisocoria  Respiratory: Negative for cough, choking, chest tightness, shortness of breath and wheezing.   Cardiovascular: Negative for chest pain, palpitations and leg swelling.  Gastrointestinal: Positive for diarrhea. Negative for abdominal distention, abdominal pain, constipation and nausea.  Endocrine: Negative.  Negative for cold intolerance, heat intolerance, polydipsia, polyphagia and polyuria.  Genitourinary: Negative for difficulty urinating, dysuria, flank pain, frequency, hematuria, pelvic pain, urgency and vaginal discharge.       Incontinent of urine.  Musculoskeletal: Negative for arthralgias, back pain, gait problem, myalgias, neck pain and neck stiffness.       02/03/2015 ORIF of a closed displaced transverse fracture of right patella. Right leg splint. Left knee pain. Hx of tibial plateau fracture. Osteoarthritis of the fingers. 04/02/16 pelvis fx and fx left  distal radius  Skin: Negative.  Negative for color change, pallor and rash.  Allergic/Immunologic: Negative.   Neurological: Positive for dizziness. Negative for tremors, seizures, syncope, weakness, numbness and headaches.  Hematological: Negative.  Negative for adenopathy. Does not bruise/bleed easily.  Psychiatric/Behavioral: Negative for agitation, behavioral problems, confusion, dysphoric mood, hallucinations, sleep disturbance and suicidal ideas. The patient is not nervous/anxious and is not hyperactive.  Vitals:   04/08/16 1006  BP: 130/70  Pulse: 88  Resp: 16  Temp: 98 F (36.7 C)  SpO2: 96%  Weight: 99 lb (44.9 kg)  Height: 5\' 1"  (1.549 m)   Body mass index is 18.71 kg/m. Physical Exam  Constitutional: She is oriented to person, place, and time. She appears well-developed and well-nourished. No distress.  HENT:  Head: Normocephalic and atraumatic.  Right Ear: External ear normal.  Left Ear: External ear normal.  Nose: Nose normal.  Mouth/Throat: Oropharynx is clear and moist. No oropharyngeal exudate.  Bilateral hearing aids. Profound loss of hearing.  Eyes: Conjunctivae and EOM are normal. Right eye exhibits no discharge. Left eye exhibits no discharge. No scleral icterus.  Right pupil larger than the left pupil.  Neck: Normal range of motion. Neck supple. No JVD present. No tracheal deviation present. No thyromegaly present.  Cardiovascular: Normal rate, regular rhythm, normal heart sounds and intact distal pulses.   No murmur heard. Pulmonary/Chest: Effort normal and breath sounds normal. No respiratory distress. She has no wheezes. She has no rales.  Abdominal: Soft. Bowel sounds are normal. She exhibits no distension. There is no tenderness.  Musculoskeletal: She exhibits edema and tenderness.  Left knee pain.  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 Tender SI joint areas bilaterally. Able to stand and sit unassisted. Cast on the left  forearm. Pain with movement of pelvis.  Neurological: She is alert and oriented to person, place, and time. She displays normal reflexes. No cranial nerve deficit. She exhibits normal muscle tone. Coordination normal.  Has difficulty finding the right words sometimes.  Skin: No rash noted. She is not diaphoretic. No erythema. No pallor.  Brownish pigmented BLE  Psychiatric: Her behavior is normal. Judgment and thought content normal.  Talks constantly and changes her subjects frequently.  Frustrated by her ill health. "I wish I was dead right now and did not have to go through all this":    Labs reviewed: Basic Metabolic Panel:  Recent Labs  04/03/16 0555 04/04/16 0551 04/05/16 0533  NA 134* 133* 130*  K 4.1 4.4 4.2  CL 102 100* 100*  CO2 26 26 23   GLUCOSE 97 104* 99  BUN 20 16 15   CREATININE 0.66 0.63 0.57  CALCIUM 8.5* 8.6* 8.2*   Liver Function Tests:  Recent Labs  12/19/15 03/19/16  AST 21 20  ALT 17 15  ALKPHOS 54 52   No results for input(s): LIPASE, AMYLASE in the last 8760 hours. No results for input(s): AMMONIA in the last 8760 hours. CBC:  Recent Labs  04/02/16 1718 04/03/16 0555 04/04/16 0551 04/05/16 0533  WBC 9.6 7.0 8.3 7.6  NEUTROABS 7.2  --   --   --   HGB 13.7 12.4 12.6 12.3  HCT 41.1 36.7 35.9* 35.2*  MCV 90.9 91.1 87.1 88.9  PLT 197 164 139* 161   Cardiac Enzymes: No results for input(s): CKTOTAL, CKMB, CKMBINDEX, TROPONINI in the last 8760 hours. BNP: Invalid input(s): POCBNP Lab Results  Component Value Date   HGBA1C 6.0 06/14/2014   Lab Results  Component Value Date   TSH 4.03 12/19/2015   Lab Results  Component Value Date   [id number] 11/28/2011   No results found for: FOLATE No results found for: IRON, TIBC, FERRITIN  Imaging and Procedures obtained prior to SNF admission: Dg Wrist Complete Left  Result Date: 04/02/2016 CLINICAL DATA:  Fall with pain in the wrist EXAM: LEFT WRIST - COMPLETE 3+  VIEW COMPARISON:   None. FINDINGS: Comminuted, impacted intra-articular fracture of the distal radius with radially displaced fracture fragment. Mild dorsal angulation of distal fracture fragment. Mildly displaced ulnar styloid process fracture. Degenerative changes at the first [facility] joint. Degenerative cysts within the carpal bones. IMPRESSION: 1. Comminuted, impacted intra-articular fracture of the distal radius 2. Mildly displaced ulnar styloid process fracture Electronically Signed   By: Donavan Foil M.D.   On: 04/02/2016 16:17   Ct Pelvis Wo Contrast  Result Date: 04/02/2016 CLINICAL DATA:  82 year old female with history of trauma from a fall today complaining of pain in the left hip. EXAM: CT PELVIS WITHOUT CONTRAST TECHNIQUE: Multidetector CT imaging of the pelvis was performed following the standard protocol without intravenous contrast. COMPARISON:  No priors. FINDINGS: Urinary Tract:  Urinary bladder appears intact and is unremarkable. Bowel: Visualized portions of small bowel and colon are remarkable for a few scattered colonic diverticulae, but are otherwise unremarkable. Vascular/Lymphatic: Aortic atherosclerosis and calcified atherosclerotic plaque throughout the pelvic vasculature. No definite aneurysm on today's noncontrast CT examination. No lymphadenopathy. Reproductive: Status post hysterectomy. Ovaries are not confidently identified may be surgically absent or atrophic. Other: No significant volume of ascites and no pneumoperitoneum in the visualized portions of the peritoneal cavity. Musculoskeletal: There is a nondisplaced fracture through the parasymphyseal region of the left pubic bone which appears minimally comminuted, best appreciated on the coronal reconstructions. Remaining portions of the bony pelvis otherwise appear intact. Bilateral proximal femurs as visualized are intact, and the femoral heads are located bilaterally. IMPRESSION: 1. Minimally comminuted nondisplaced fracture of the  parasymphyseal region of the left pubic bone, as above. 2. Aortic atherosclerosis. 3. Colonic diverticulosis. Electronically Signed   By: Vinnie Langton M.D.   On: 04/02/2016 18:31   Mr Jeri Cos [id number] Contrast  Result Date: 04/02/2016 CLINICAL DATA:  Initial evaluation for acute slurred speech. EXAM: MRI HEAD WITHOUT AND WITH CONTRAST TECHNIQUE: Multiplanar, multiecho pulse sequences of the brain and surrounding structures were obtained without and with intravenous contrast. CONTRAST:  32mL MULTIHANCE GADOBENATE DIMEGLUMINE 529 MG/ML IV SOLN COMPARISON:  Comparison made with prior CT from 01/03/2016. FINDINGS: Brain: Diffusion-weighted imaging demonstrates no evidence for acute or subacute ischemia. Diffuse prominence of the CSF containing spaces is compatible with generalized age-related cerebral atrophy. Patchy and confluent T2/FLAIR hyperintensity within the periventricular and deep white matter both cerebral hemispheres most compatible chronic small vessel ischemic disease, mild for age. There is a partially solid/partially cystic mass centered at the posterior left sylvian fissure. Solid enhancing nodular component measures 12 x 11 x 7 mm. Cystic component measures 23 x 25 x 23 mm. No significant vasogenic edema. Additional 9 x 7 x 8 mm enhancing nodule within the right parasagittal parieto-occipital region (series 10, image 40). No significant edema. Additional punctate 3 mm nodular focus of enhancement within the right occipital lobe with mild localized edema (series 10, image 27). Few additional punctate nodular foci of enhancement seen within the right parietal lobe (series 10, image 43) as well as the left parietal lobe (series 10, image 36). Small bilateral punctate cerebellar lesions (series 10, image 22 on the right, image 21 on the left). Findings are most compatible with intracranial metastatic disease. No significant mass effect. No midline shift. No associated hemorrhage on gradient echo sequence.  No other acute intracranial process. Ventricles normal in size without evidence for hydrocephalus. No extra-axial fluid collection. Major dural sinuses are grossly patent. Pituitary gland within normal limits. Vascular: Major intracranial vascular flow voids are  maintained. Skull and upper cervical spine: Craniocervical junction within normal limits. Advanced degenerative spondylolysis noted within the visualized upper cervical spine with mild to moderate diffuse stenosis. Bone marrow signal intensity within normal limits. No scalp soft tissue abnormality. Sinuses/Orbits: Globes and orbital soft tissues within normal limits. Patient is status post lens extraction bilaterally. Paranasal sinuses are clear. No mastoid effusion. Inner ear structures grossly normal. IMPRESSION: 1. Multiple intracranial metastases as detailed above. Dominant lesion is partially solid and partially cystic in nature, positioned at the posterior left sylvian fissure. No significant mass effect or other complication identified. 2. No other acute intracranial process identified. Electronically Signed   By: Jeannine Boga M.D.   On: 04/02/2016 22:24   Dg Hip Unilat W Or Wo Pelvis 2-3 Views Left  Result Date: 04/02/2016 CLINICAL DATA:  Fall with hip pain EXAM: DG HIP (WITH OR WITHOUT PELVIS) 2-3V LEFT COMPARISON:  None. FINDINGS: Mild SI joint disease.  Calcified pelvic phleboliths. Left femoral head is normally positioned. There are nondisplaced fractures involving the left superior and inferior pubic bone with nondisplaced fracture of the left inferior ramus. IMPRESSION: Nondisplaced left pubic bone fracture. Electronically Signed   By: Donavan Foil M.D.   On: 04/02/2016 16:23    Assessment/Plan 1. Closed fracture of distal end of left radius with routine healing, unspecified fracture morphology, subsequent encounter Rehab and see Dr. Sudie Bailey as planned  2. Fracture of multiple pubic rami, right, closed, initial encounter  ([place]) Rehab PT and OT. Stand and walk with hellp as tolerated.  3. Squamous cell skin cancer Remote history  4. Slurred speech Likely related to brain mets  5. Brain metastases ([place]) Likely related to lung nodules. See Dr. Tomi Likens as planned She has been started on Keppra for seizre prevention.  6. Multiple pulmonary nodules See report above.

## 2016-04-09 LAB — BASIC METABOLIC PANEL
BUN: 15 mg/dL (ref 4–21)
Creatinine: 0.7 mg/dL (ref 0.5–1.1)
GLUCOSE: 92 mg/dL
POTASSIUM: 4.5 mmol/L (ref 3.4–5.3)
SODIUM: 138 mmol/L (ref 137–147)

## 2016-04-09 LAB — HEPATIC FUNCTION PANEL
ALK PHOS: 43 U/L (ref 25–125)
ALT: 21 U/L (ref 7–35)
AST: 22 U/L (ref 13–35)
BILIRUBIN, TOTAL: 1.1 mg/dL

## 2016-04-09 LAB — CBC AND DIFFERENTIAL
HEMATOCRIT: 33 % — AB (ref 36–46)
HEMOGLOBIN: 11.2 g/dL — AB (ref 12.0–16.0)
Platelets: 225 10*3/uL (ref 150–399)
WBC: 5.9 10^3/mL

## 2016-04-11 ENCOUNTER — Non-Acute Institutional Stay (SKILLED_NURSING_FACILITY): Payer: Medicare Other | Admitting: Internal Medicine

## 2016-04-11 ENCOUNTER — Encounter: Payer: Self-pay | Admitting: Internal Medicine

## 2016-04-11 DIAGNOSIS — F5101 Primary insomnia: Secondary | ICD-10-CM

## 2016-04-11 DIAGNOSIS — S32511D Fracture of superior rim of right pubis, subsequent encounter for fracture with routine healing: Secondary | ICD-10-CM

## 2016-04-11 DIAGNOSIS — C7931 Secondary malignant neoplasm of brain: Secondary | ICD-10-CM | POA: Diagnosis not present

## 2016-04-11 MED ORDER — MELATONIN 5 MG PO TABS: ORAL_TABLET | ORAL | 5 refills | Status: AC

## 2016-04-11 MED ORDER — FENTANYL 50 MCG/HR TD PT72: MEDICATED_PATCH | TRANSDERMAL | 0 refills | Status: DC

## 2016-04-11 NOTE — Progress Notes (Signed)
Progress Note    Location:  [facility] Number: [id number] Place of Service:  SNF [id number]) Provider:  Jeanmarie Hubert, MD  Patient Care Team: Estill Dooms, MD as PCP - General (Internal Medicine) Dorna Leitz, MD as Consulting Physician (Orthopedic Surgery) Carol Ada, MD as Consulting Physician (Gastroenterology) Crista Luria, MD as Consulting Physician (Dermatology) [facility] as Audiologist (Audiology) Man Otho Darner, NP as Nurse Practitioner (Nurse Practitioner) Tanda Rockers, MD as Consulting Physician (Pulmonary Disease) Rutherford Guys, MD as Consulting Physician (Ophthalmology)  Extended Emergency Contact Information Primary Emergency Contact: Aron Baba [place] Phone: [phone] Relation: Friend  Code Status:  DNR Goals of care: Advanced Directive information Advanced Directives 04/11/2016  Does Patient Have a Medical Advance Directive? Yes  Type of Advance Directive Living will;Out of facility DNR (pink MOST or yellow form)  Does patient want to make changes to medical advance directive? -  Copy of [place] in Chart? -  Pre-existing out of facility DNR order (yellow form or pink MOST form) Yellow form placed in chart (order not valid for inpatient use)     Chief Complaint  Patient presents with  . Acute Visit    pelvic pain     HPI:  Pt is a 82 y.o. female seen today for an acute visit for reevaluation of continued pelvic pain and new complaints of rectal pain.   Continues to say that sleep is difficult.  Anxious and irritable.  Having more difficulty with word finding. Denies headache or dizziness.  Past Medical History:  Diagnosis Date  . Allergy   . Arthritis   . Cancer (HCC)    squamous cell  . Female stress incontinence   . Frequent loose stools   . GERD (gastroesophageal reflux disease)   . Incontinence of urine   . Insomnia   . Nocturia   . Oxygen  deficiency   . Sepsis due to Escherichia coli (E. coli) ([place])   . Squamous cell carcinoma of skin   . Stroke [facility])    TIA  . TIA (transient ischemic attack) 01/05/13   Past Surgical History:  Procedure Laterality Date  . ABDOMINAL HYSTERECTOMY  1959  . APPENDECTOMY  1959  . BREAST SURGERY  1982   breast  . COLONOSCOPY  2013   Dr. Carol Ada  . ORIF PATELLA Right 02/03/2015   Procedure: OPEN REDUCTION INTERNAL (ORIF) FIXATION RIGHT PATELLA;  Surgeon: Dorna Leitz, MD;  Location: [facility];  Service: Orthopedics;  Laterality: Right;  . TEE WITHOUT CARDIOVERSION N/A 01/18/2013   Procedure: TRANSESOPHAGEAL ECHOCARDIOGRAM (TEE);  Surgeon: Lelon Perla, MD;  Location: [facility] ENDOSCOPY;  Service: Cardiovascular;  Laterality: N/A;  . TONSILLECTOMY  1940    Allergies  Allergen Reactions  . Macrobid [Nitrofurantoin Monohyd Macro] Nausea Only  . Penicillin G Itching    Can tolerate CEPHALOSPORINS Has patient had a PCN reaction causing immediate rash, facial/tongue/throat swelling, SOB or lightheadedness with hypotension: No Has patient had a PCN reaction causing severe rash involving mucus membranes or skin necrosis: No Has patient had a PCN reaction that required hospitalization No Has patient had a PCN reaction occurring within the last 10 years: No If all of the above answers are "NO", then may proceed with Cephalosporin use.  Sarina Ill [Bactrim]     Stomach concerns.     Allergies as of 04/11/2016      Reactions   Macrobid [nitrofurantoin Monohyd Macro]  Nausea Only   Penicillin G Itching   Can tolerate CEPHALOSPORINS Has patient had a PCN reaction causing immediate rash, facial/tongue/throat swelling, SOB or lightheadedness with hypotension: No Has patient had a PCN reaction causing severe rash involving mucus membranes or skin necrosis: No Has patient had a PCN reaction that required hospitalization No Has patient had a PCN reaction occurring within the last 10 years: No If all of  the above answers are "NO", then may proceed with Cephalosporin use.   Septra [bactrim]    Stomach concerns.       Medication List       Accurate as of 04/11/16 11:49 AM. Always use your most recent med list.          aspirin EC 81 MG tablet Take 81 mg by mouth daily.   b complex vitamins tablet Take 1 tablet by mouth daily.   beta carotene w/minerals tablet Take 1 tablet by mouth daily.   OYSTER SHELL CALCIUM + D 500-200 MG-UNIT Tabs Generic drug:  Calcium Carb-Cholecalciferol Take 1 tablet by mouth daily.   CALCIUM 600 + D PO Take 1 tablet by mouth daily.   fentaNYL 25 MCG/HR patch Commonly known as:  DURAGESIC - dosed mcg/hr Place 25 mcg onto the skin. Apply one patch every three days   ibuprofen 200 MG tablet Commonly known as:  ADVIL,MOTRIN Take 200 mg by mouth every 6 (six) hours as needed for headache.   levETIRAcetam 250 MG tablet Commonly known as:  KEPPRA Take 1 tablet (250 mg total) by mouth 2 (two) times daily.   loperamide 2 MG capsule Commonly known as:  IMODIUM Take 2 mg by mouth as needed for diarrhea or loose stools.   mirtazapine 15 MG tablet Commonly known as:  REMERON Take 15 mg by mouth at bedtime.   PREPARATION H 0.25-14-74.9 % rectal ointment Generic drug:  phenylephrine-shark liver oil-mineral oil-petrolatum Place 1 application rectally. Apply as needed three times a day   SYSTANE ULTRA 0.4-0.3 % Soln Generic drug:  Polyethyl Glycol-Propyl Glycol Place 1 drop into both eyes 3 (three) times daily.   SYSTANE 0.4-0.3 % Gel ophthalmic gel Generic drug:  Polyethyl Glycol-Propyl Glycol Place 1 application into both eyes at bedtime.   vitamin C 1000 MG tablet Take 1,000 mg by mouth daily.       Review of Systems  Constitutional: Negative.  Negative for activity change, appetite change, chills, diaphoresis, fatigue, fever and unexpected weight change.  HENT: Positive for hearing loss and voice change (hoarse). Negative for  congestion, ear discharge, ear pain, postnasal drip, rhinorrhea, sore throat, tinnitus and trouble swallowing.   Eyes: Negative for pain, redness, itching and visual disturbance.       Hx anisocoria  Respiratory: Negative for cough, choking, chest tightness, shortness of breath and wheezing.   Cardiovascular: Negative for chest pain, palpitations and leg swelling.  Gastrointestinal: Positive for diarrhea. Negative for abdominal distention, abdominal pain, constipation and nausea.  Endocrine: Negative.  Negative for cold intolerance, heat intolerance, polydipsia, polyphagia and polyuria.  Genitourinary: Negative for difficulty urinating, dysuria, flank pain, frequency, hematuria, pelvic pain, urgency and vaginal discharge.       Incontinent of urine.  Musculoskeletal: Negative for arthralgias, back pain, gait problem, myalgias, neck pain and neck stiffness.       02/03/2015 ORIF of a closed displaced transverse fracture of right patella. Right leg splint. Left knee pain. Hx of tibial plateau fracture. Osteoarthritis of the fingers. 04/02/16 pelvis fx and fx left distal  radius  Skin: Negative.  Negative for color change, pallor and rash.  Allergic/Immunologic: Negative.   Neurological: Positive for dizziness. Negative for tremors, seizures, syncope, weakness, numbness and headaches.       Difficulty with word finding. Hx brain metastases.  Hematological: Negative.  Negative for adenopathy. Does not bruise/bleed easily.  Psychiatric/Behavioral: Positive for dysphoric mood and sleep disturbance. Negative for agitation, behavioral problems, confusion, hallucinations and suicidal ideas. The patient is not nervous/anxious and is not hyperactive.     Immunization History  Administered Date(s) Administered  . Influenza Split 02/18/2011, 01/03/2012, 01/20/2014  . Influenza,inj,Quad PF,36+ Mos 12/30/2012  . Influenza-Unspecified 01/19/2015, 02/01/2016  . Pneumococcal Conjugate-13 12/26/2009  . Tdap  04/27/2006, 01/15/2015   Pertinent  Health Maintenance Due  Topic Date Due  . DEXA SCAN  05/29/1989  . PNA vac Low Risk Adult (2 of 2 - PPSV23) 12/27/2010  . INFLUENZA VACCINE  Completed   Fall Risk  02/01/2016 08/17/2015 02/09/2015 09/24/2014 03/24/2014  Falls in the past year? No Yes Yes No No  Number falls in past yr: - 1 2 or more - -  Injury with Fall? - No Yes - -  Risk for fall due to : - - History of fall(s) - -   Vitals:   04/11/16 1129  BP: (!) 156/72  Pulse: 68  Resp: 20  Temp: 97.1 F (36.2 C)  Weight: 99 lb (44.9 kg)  Height: 5\' 1"  (1.549 m)   Body mass index is 18.71 kg/m. Physical Exam  Constitutional: She is oriented to person, place, and time. She appears well-developed and well-nourished. No distress.  HENT:  Head: Normocephalic and atraumatic.  Right Ear: External ear normal.  Left Ear: External ear normal.  Nose: Nose normal.  Mouth/Throat: Oropharynx is clear and moist. No oropharyngeal exudate.  Bilateral hearing aids. Profound loss of hearing.  Eyes: Conjunctivae and EOM are normal. Right eye exhibits no discharge. Left eye exhibits no discharge. No scleral icterus.  Right pupil larger than the left pupil.  Neck: Normal range of motion. Neck supple. No JVD present. No tracheal deviation present. No thyromegaly present.  Cardiovascular: Normal rate, regular rhythm, normal heart sounds and intact distal pulses.   No murmur heard. Pulmonary/Chest: Effort normal and breath sounds normal. No respiratory distress. She has no wheezes. She has no rales.  Abdominal: Soft. Bowel sounds are normal. She exhibits no distension. There is no tenderness.  Musculoskeletal: She exhibits edema and tenderness.  Left knee pain.  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 Tender SI joint areas bilaterally. Able to stand and sit unassisted. Cast on the left forearm. Pain with movement of pelvis.  Neurological: She is alert and oriented to person, place,  and time. She displays normal reflexes. No cranial nerve deficit. She exhibits normal muscle tone. Coordination normal.  Has difficulty finding the right words sometimes.  Skin: No rash noted. She is not diaphoretic. No erythema. No pallor.  Brownish pigmented BLE. Bruising at the left hip. Small hematoma at the anus that is painful.  Psychiatric: Her behavior is normal. Judgment and thought content normal.  Talks constantly and changes her subjects frequently.  Frustrated by her ill health. "I wish I was dead right now and did not have to go through all this":    Labs reviewed:  Recent Labs  04/03/16 0555 04/04/16 0551 04/05/16 0533 04/09/16  NA 134* 133* 130* 138  K 4.1 4.4 4.2 4.5  CL 102 100* 100*  --  CO2 26 26 23   --   GLUCOSE 97 104* 99  --   BUN 20 16 15 15   CREATININE 0.66 0.63 0.57 0.7  CALCIUM 8.5* 8.6* 8.2*  --     Recent Labs  12/19/15 03/19/16 04/09/16  AST 21 20 22   ALT 17 15 21   ALKPHOS 54 52 43    Recent Labs  04/02/16 1718 04/03/16 0555 04/04/16 0551 04/05/16 0533 04/09/16  WBC 9.6 7.0 8.3 7.6 5.9  NEUTROABS 7.2  --   --   --   --   HGB 13.7 12.4 12.6 12.3 11.2*  HCT 41.1 36.7 35.9* 35.2* 33*  MCV 90.9 91.1 87.1 88.9  --   PLT 197 164 139* 161 225   Lab Results  Component Value Date   TSH 4.03 12/19/2015   Lab Results  Component Value Date   HGBA1C 6.0 06/14/2014   Lab Results  Component Value Date   CHOL 174 06/14/2014   HDL 96 (A) 06/14/2014   LDLCALC 64 06/14/2014   TRIG 69 06/14/2014   CHOLHDL 1.4 01/13/2013    Significant Diagnostic Results in last 30 days:  Dg Wrist Complete Left  Result Date: 04/02/2016 CLINICAL DATA:  Fall with pain in the wrist EXAM: LEFT WRIST - COMPLETE 3+ VIEW COMPARISON:  None. FINDINGS: Comminuted, impacted intra-articular fracture of the distal radius with radially displaced fracture fragment. Mild dorsal angulation of distal fracture fragment. Mildly displaced ulnar styloid process fracture.  Degenerative changes at the first [facility] joint. Degenerative cysts within the carpal bones. IMPRESSION: 1. Comminuted, impacted intra-articular fracture of the distal radius 2. Mildly displaced ulnar styloid process fracture Electronically Signed   By: Donavan Foil M.D.   On: 04/02/2016 16:17   Ct Pelvis Wo Contrast  Result Date: 04/02/2016 CLINICAL DATA:  82 year old female with history of trauma from a fall today complaining of pain in the left hip. EXAM: CT PELVIS WITHOUT CONTRAST TECHNIQUE: Multidetector CT imaging of the pelvis was performed following the standard protocol without intravenous contrast. COMPARISON:  No priors. FINDINGS: Urinary Tract:  Urinary bladder appears intact and is unremarkable. Bowel: Visualized portions of small bowel and colon are remarkable for a few scattered colonic diverticulae, but are otherwise unremarkable. Vascular/Lymphatic: Aortic atherosclerosis and calcified atherosclerotic plaque throughout the pelvic vasculature. No definite aneurysm on today's noncontrast CT examination. No lymphadenopathy. Reproductive: Status post hysterectomy. Ovaries are not confidently identified may be surgically absent or atrophic. Other: No significant volume of ascites and no pneumoperitoneum in the visualized portions of the peritoneal cavity. Musculoskeletal: There is a nondisplaced fracture through the parasymphyseal region of the left pubic bone which appears minimally comminuted, best appreciated on the coronal reconstructions. Remaining portions of the bony pelvis otherwise appear intact. Bilateral proximal femurs as visualized are intact, and the femoral heads are located bilaterally. IMPRESSION: 1. Minimally comminuted nondisplaced fracture of the parasymphyseal region of the left pubic bone, as above. 2. Aortic atherosclerosis. 3. Colonic diverticulosis. Electronically Signed   By: Vinnie Langton M.D.   On: 04/02/2016 18:31   Mr Jeri Cos [id number] Contrast  Result Date:  04/02/2016 CLINICAL DATA:  Initial evaluation for acute slurred speech. EXAM: MRI HEAD WITHOUT AND WITH CONTRAST TECHNIQUE: Multiplanar, multiecho pulse sequences of the brain and surrounding structures were obtained without and with intravenous contrast. CONTRAST:  44mL MULTIHANCE GADOBENATE DIMEGLUMINE 529 MG/ML IV SOLN COMPARISON:  Comparison made with prior CT from 01/03/2016. FINDINGS: Brain: Diffusion-weighted imaging demonstrates no evidence for acute or subacute ischemia. Diffuse prominence of  the CSF containing spaces is compatible with generalized age-related cerebral atrophy. Patchy and confluent T2/FLAIR hyperintensity within the periventricular and deep white matter both cerebral hemispheres most compatible chronic small vessel ischemic disease, mild for age. There is a partially solid/partially cystic mass centered at the posterior left sylvian fissure. Solid enhancing nodular component measures 12 x 11 x 7 mm. Cystic component measures 23 x 25 x 23 mm. No significant vasogenic edema. Additional 9 x 7 x 8 mm enhancing nodule within the right parasagittal parieto-occipital region (series 10, image 40). No significant edema. Additional punctate 3 mm nodular focus of enhancement within the right occipital lobe with mild localized edema (series 10, image 27). Few additional punctate nodular foci of enhancement seen within the right parietal lobe (series 10, image 43) as well as the left parietal lobe (series 10, image 36). Small bilateral punctate cerebellar lesions (series 10, image 22 on the right, image 21 on the left). Findings are most compatible with intracranial metastatic disease. No significant mass effect. No midline shift. No associated hemorrhage on gradient echo sequence. No other acute intracranial process. Ventricles normal in size without evidence for hydrocephalus. No extra-axial fluid collection. Major dural sinuses are grossly patent. Pituitary gland within normal limits. Vascular: Major  intracranial vascular flow voids are maintained. Skull and upper cervical spine: Craniocervical junction within normal limits. Advanced degenerative spondylolysis noted within the visualized upper cervical spine with mild to moderate diffuse stenosis. Bone marrow signal intensity within normal limits. No scalp soft tissue abnormality. Sinuses/Orbits: Globes and orbital soft tissues within normal limits. Patient is status post lens extraction bilaterally. Paranasal sinuses are clear. No mastoid effusion. Inner ear structures grossly normal. IMPRESSION: 1. Multiple intracranial metastases as detailed above. Dominant lesion is partially solid and partially cystic in nature, positioned at the posterior left sylvian fissure. No significant mass effect or other complication identified. 2. No other acute intracranial process identified. Electronically Signed   By: Jeannine Boga M.D.   On: 04/02/2016 22:24   Dg Hip Unilat W Or Wo Pelvis 2-3 Views Left  Result Date: 04/02/2016 CLINICAL DATA:  Fall with hip pain EXAM: DG HIP (WITH OR WITHOUT PELVIS) 2-3V LEFT COMPARISON:  None. FINDINGS: Mild SI joint disease.  Calcified pelvic phleboliths. Left femoral head is normally positioned. There are nondisplaced fractures involving the left superior and inferior pubic bone with nondisplaced fracture of the left inferior ramus. IMPRESSION: Nondisplaced left pubic bone fracture. Electronically Signed   By: Donavan Foil M.D.   On: 04/02/2016 16:23    Assessment/Plan 1. Closed fracture of superior ramus of right pubis with routine healing, subsequent encounter - fentaNYL (DURAGESIC) 50 MCG/HR; Apply fresh patch every third day. Remove old patch. For pain relief.  Dispense: 10 patch; Refill: 0  2. Primary insomnia - Melatonin 5 MG TABS; 1 nightly for sleep  Dispense: 30 tablet; Refill: 5  3. Brain metastases ([place]) I believe this is the cause of her difficulty with word finding

## 2016-04-17 ENCOUNTER — Encounter: Payer: Self-pay | Admitting: Nurse Practitioner

## 2016-04-17 ENCOUNTER — Non-Acute Institutional Stay (SKILLED_NURSING_FACILITY): Payer: Medicare Other | Admitting: Nurse Practitioner

## 2016-04-17 DIAGNOSIS — F5101 Primary insomnia: Secondary | ICD-10-CM

## 2016-04-17 DIAGNOSIS — S52502D Unspecified fracture of the lower end of left radius, subsequent encounter for closed fracture with routine healing: Secondary | ICD-10-CM

## 2016-04-17 DIAGNOSIS — S32511D Fracture of superior rim of right pubis, subsequent encounter for fracture with routine healing: Secondary | ICD-10-CM

## 2016-04-17 DIAGNOSIS — C7931 Secondary malignant neoplasm of brain: Secondary | ICD-10-CM

## 2016-04-17 DIAGNOSIS — S32591A Other specified fracture of right pubis, initial encounter for closed fracture: Secondary | ICD-10-CM | POA: Diagnosis not present

## 2016-04-17 NOTE — Assessment & Plan Note (Signed)
Improved, continue Mirtazapine.  

## 2016-04-17 NOTE — Progress Notes (Signed)
Location:  [facility] Room Number: 75 Place of Service:  SNF (31) Provider:  Mast, Manxie  NP  Jeanmarie Hubert, MD  Patient Care Team: Estill Dooms, MD as PCP - General (Internal Medicine) Dorna Leitz, MD as Consulting Physician (Orthopedic Surgery) Carol Ada, MD as Consulting Physician (Gastroenterology) Crista Luria, MD as Consulting Physician (Dermatology) [facility] as Audiologist (Audiology) Man Otho Darner, NP as Nurse Practitioner (Nurse Practitioner) Tanda Rockers, MD as Consulting Physician (Pulmonary Disease) Rutherford Guys, MD as Consulting Physician (Ophthalmology)  Extended Emergency Contact Information Primary Emergency Contact: Aron Baba [place] Phone: [phone] Relation: Friend  Code Status: DNR  Goals of care: Advanced Directive information Advanced Directives 04/17/2016  Does Patient Have a Medical Advance Directive? Yes  Type of Advance Directive Living will;Out of facility DNR (pink MOST or yellow form)  Does patient want to make changes to medical advance directive? No - Patient declined  Copy of [place] in Chart? Yes  Pre-existing out of facility DNR order (yellow form or pink MOST form) -     Chief Complaint  Patient presents with  . Acute Visit    Having a lot pf pain    HPI:  Pt is a 82 y.o. female seen today for an acute visit for pain allover, Fentanyl 74mcg and prn Norco are not adequate. Recent fxs of superior ramus of right pubis and fx of the left wrist(in short cast)  04/02/16 to 04/05/16 hospitalized s/p fall, pelvic fx, left distal radius, brain metastases, she is taking Keppra for seizure risk reduction.   She sleeps better on Mirtazapine 15mg , hs, still anxious and irritable, but easily redirected. Brain metastases disease, difficulty of balance and words findings.    Past Medical History:  Diagnosis Date  . Allergy   . Arthritis   .  Cancer (HCC)    squamous cell  . Female stress incontinence   . Frequent loose stools   . GERD (gastroesophageal reflux disease)   . Incontinence of urine   . Insomnia   . Nocturia   . Oxygen deficiency   . Sepsis due to Escherichia coli (E. coli) ([place])   . Squamous cell carcinoma of skin   . Stroke [facility])    TIA  . TIA (transient ischemic attack) 01/05/13   Past Surgical History:  Procedure Laterality Date  . ABDOMINAL HYSTERECTOMY  1959  . APPENDECTOMY  1959  . BREAST SURGERY  1982   breast  . COLONOSCOPY  2013   Dr. Carol Ada  . ORIF PATELLA Right 02/03/2015   Procedure: OPEN REDUCTION INTERNAL (ORIF) FIXATION RIGHT PATELLA;  Surgeon: Dorna Leitz, MD;  Location: [facility];  Service: Orthopedics;  Laterality: Right;  . TEE WITHOUT CARDIOVERSION N/A 01/18/2013   Procedure: TRANSESOPHAGEAL ECHOCARDIOGRAM (TEE);  Surgeon: Lelon Perla, MD;  Location: [facility] ENDOSCOPY;  Service: Cardiovascular;  Laterality: N/A;  . TONSILLECTOMY  1940    Allergies  Allergen Reactions  . Macrobid [Nitrofurantoin Monohyd Macro] Nausea Only  . Penicillin G Itching    Can tolerate CEPHALOSPORINS Has patient had a PCN reaction causing immediate rash, facial/tongue/throat swelling, SOB or lightheadedness with hypotension: No Has patient had a PCN reaction causing severe rash involving mucus membranes or skin necrosis: No Has patient had a PCN reaction that required hospitalization No Has patient had a PCN reaction occurring within the last 10 years: No If all of the above answers are "NO", then may  proceed with Cephalosporin use.  Sarina Ill [Bactrim]     Stomach concerns.     Allergies as of 04/17/2016      Reactions   Macrobid [nitrofurantoin Monohyd Macro] Nausea Only   Penicillin G Itching   Can tolerate CEPHALOSPORINS Has patient had a PCN reaction causing immediate rash, facial/tongue/throat swelling, SOB or lightheadedness with hypotension: No Has patient had a PCN reaction causing severe  rash involving mucus membranes or skin necrosis: No Has patient had a PCN reaction that required hospitalization No Has patient had a PCN reaction occurring within the last 10 years: No If all of the above answers are "NO", then may proceed with Cephalosporin use.   Septra [bactrim]    Stomach concerns.       Medication List       Accurate as of 04/17/16  4:23 PM. Always use your most recent med list.          ALPRAZolam 0.25 MG tablet Commonly known as:  XANAX Take 0.25 mg by mouth every 8 (eight) hours as needed for anxiety.   aspirin EC 81 MG tablet Take 81 mg by mouth daily.   b complex vitamins tablet Take 1 tablet by mouth daily.   OYSTER SHELL CALCIUM + D 500-200 MG-UNIT Tabs Generic drug:  Calcium Carb-Cholecalciferol Take 1 tablet by mouth daily.   CALCIUM 600 + D PO Take 1 tablet by mouth daily.   fentaNYL 50 MCG/HR Commonly known as:  DURAGESIC Apply fresh patch every third day. Remove old patch. For pain relief.   ibuprofen 200 MG tablet Commonly known as:  ADVIL,MOTRIN Take 200 mg by mouth every 6 (six) hours as needed for headache.   levETIRAcetam 250 MG tablet Commonly known as:  KEPPRA Take 1 tablet (250 mg total) by mouth 2 (two) times daily.   loperamide 2 MG capsule Commonly known as:  IMODIUM Take 2 mg by mouth as needed for diarrhea or loose stools.   Melatonin 5 MG Tabs 1 nightly for sleep   mirtazapine 15 MG tablet Commonly known as:  REMERON Take 15 mg by mouth at bedtime.   morphine CONCENTRATE 10 mg / 0.5 ml concentrated solution Take 5 mg by mouth every 2 (two) hours as needed for severe pain.   PREPARATION H 0.25-14-74.9 % rectal ointment Generic drug:  phenylephrine-shark liver oil-mineral oil-petrolatum Place 1 application rectally. Apply as needed three times a day   SYSTANE ULTRA 0.4-0.3 % Soln Generic drug:  Polyethyl Glycol-Propyl Glycol Place 1 drop into both eyes 3 (three) times daily.   SYSTANE 0.4-0.3 % Gel  ophthalmic gel Generic drug:  Polyethyl Glycol-Propyl Glycol Place 1 application into both eyes at bedtime.   vitamin C 1000 MG tablet Take 1,000 mg by mouth daily.       Review of Systems  Constitutional: Negative.  Negative for activity change, appetite change, chills, diaphoresis, fatigue, fever and unexpected weight change.       Pain allover  HENT: Positive for hearing loss and voice change (hoarse). Negative for congestion, ear discharge, ear pain, postnasal drip, rhinorrhea, sore throat, tinnitus and trouble swallowing.   Eyes: Negative for pain, redness, itching and visual disturbance.       Hx anisocoria  Respiratory: Negative for cough, choking, chest tightness, shortness of breath and wheezing.   Cardiovascular: Negative for chest pain, palpitations and leg swelling.  Gastrointestinal: Positive for diarrhea. Negative for abdominal distention, abdominal pain, constipation and nausea.  Endocrine: Negative.  Negative for cold intolerance,  heat intolerance, polydipsia, polyphagia and polyuria.  Genitourinary: Negative for difficulty urinating, dysuria, flank pain, frequency, hematuria, pelvic pain, urgency and vaginal discharge.       Incontinent of urine.  Musculoskeletal: Negative for arthralgias, back pain, gait problem, myalgias, neck pain and neck stiffness.       02/03/2015 ORIF of a closed displaced transverse fracture of right patella. Right leg splint. Left knee pain. Hx of tibial plateau fracture. Osteoarthritis of the fingers. 04/02/16 pelvis fx and fx left distal radius  Skin: Negative.  Negative for color change, pallor and rash.  Allergic/Immunologic: Negative.   Neurological: Positive for dizziness. Negative for tremors, seizures, syncope, weakness, numbness and headaches.       Difficulty with word finding. Hx brain metastases.  Hematological: Negative.  Negative for adenopathy. Does not bruise/bleed easily.  Psychiatric/Behavioral: Positive for dysphoric mood  and sleep disturbance. Negative for agitation, behavioral problems, confusion, hallucinations and suicidal ideas. The patient is not nervous/anxious and is not hyperactive.     Immunization History  Administered Date(s) Administered  . Influenza Split 02/18/2011, 01/03/2012, 01/20/2014  . Influenza,inj,Quad PF,36+ Mos 12/30/2012  . Influenza-Unspecified 01/19/2015, 02/01/2016  . Pneumococcal Conjugate-13 12/26/2009  . Tdap 04/27/2006, 01/15/2015   Pertinent  Health Maintenance Due  Topic Date Due  . DEXA SCAN  05/29/1989  . PNA vac Low Risk Adult (2 of 2 - PPSV23) 12/27/2010  . INFLUENZA VACCINE  Completed   Fall Risk  02/01/2016 08/17/2015 02/09/2015 09/24/2014 03/24/2014  Falls in the past year? No Yes Yes No No  Number falls in past yr: - 1 2 or more - -  Injury with Fall? - No Yes - -  Risk for fall due to : - - History of fall(s) - -   Functional Status Survey:    Vitals:   04/17/16 1546  BP: 140/80  Pulse: 86  Resp: 20  Temp: 98 F (36.7 C)  Weight: 94 lb 4.8 oz (42.8 kg)  Height: 5\' 1"  (1.549 m)   Body mass index is 17.82 kg/m. Physical Exam  Constitutional: She is oriented to person, place, and time. She appears well-developed and well-nourished. No distress.  HENT:  Head: Normocephalic and atraumatic.  Right Ear: External ear normal.  Left Ear: External ear normal.  Nose: Nose normal.  Mouth/Throat: Oropharynx is clear and moist. No oropharyngeal exudate.  Bilateral hearing aids. Profound loss of hearing.  Eyes: Conjunctivae and EOM are normal. Right eye exhibits no discharge. Left eye exhibits no discharge. No scleral icterus.  Right pupil larger than the left pupil.  Neck: Normal range of motion. Neck supple. No JVD present. No tracheal deviation present. No thyromegaly present.  Cardiovascular: Normal rate, regular rhythm, normal heart sounds and intact distal pulses.   No murmur heard. Pulmonary/Chest: Effort normal and breath sounds normal. No respiratory  distress. She has no wheezes. She has no rales.  Abdominal: Soft. Bowel sounds are normal. She exhibits no distension. There is no tenderness.  Musculoskeletal: She exhibits edema and tenderness.  Left knee pain.  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 Tender SI joint areas bilaterally. Able to stand and sit unassisted. Cast on the left forearm. Pain with movement of pelvis.  Neurological: She is alert and oriented to person, place, and time. She displays normal reflexes. No cranial nerve deficit. She exhibits normal muscle tone. Coordination normal.  Has difficulty finding the right words sometimes.  Skin: No rash noted. She is not diaphoretic. No erythema. No pallor.  Brownish pigmented BLE. Bruising at the left hip. Small hematoma at the anus that is painful.  Psychiatric: Her behavior is normal. Judgment and thought content normal.  Talks constantly and changes her subjects frequently.  Frustrated by her ill health. "I wish I was dead right now and did not have to go through all this":    Labs reviewed:  Recent Labs  04/03/16 0555 04/04/16 0551 04/05/16 0533 04/09/16  NA 134* 133* 130* 138  K 4.1 4.4 4.2 4.5  CL 102 100* 100*  --   CO2 26 26 23   --   GLUCOSE 97 104* 99  --   BUN 20 16 15 15   CREATININE 0.66 0.63 0.57 0.7  CALCIUM 8.5* 8.6* 8.2*  --     Recent Labs  12/19/15 03/19/16 04/09/16  AST 21 20 22   ALT 17 15 21   ALKPHOS 54 52 43    Recent Labs  04/02/16 1718 04/03/16 0555 04/04/16 0551 04/05/16 0533 04/09/16  WBC 9.6 7.0 8.3 7.6 5.9  NEUTROABS 7.2  --   --   --   --   HGB 13.7 12.4 12.6 12.3 11.2*  HCT 41.1 36.7 35.9* 35.2* 33*  MCV 90.9 91.1 87.1 88.9  --   PLT 197 164 139* 161 225   Lab Results  Component Value Date   TSH 4.03 12/19/2015   Lab Results  Component Value Date   HGBA1C 6.0 06/14/2014   Lab Results  Component Value Date   CHOL 174 06/14/2014   HDL 96 (A) 06/14/2014   LDLCALC 64 06/14/2014   TRIG 69  06/14/2014   CHOLHDL 1.4 01/13/2013    Significant Diagnostic Results in last 30 days:  Dg Wrist Complete Left  Result Date: 04/02/2016 CLINICAL DATA:  Fall with pain in the wrist EXAM: LEFT WRIST - COMPLETE 3+ VIEW COMPARISON:  None. FINDINGS: Comminuted, impacted intra-articular fracture of the distal radius with radially displaced fracture fragment. Mild dorsal angulation of distal fracture fragment. Mildly displaced ulnar styloid process fracture. Degenerative changes at the first [facility] joint. Degenerative cysts within the carpal bones. IMPRESSION: 1. Comminuted, impacted intra-articular fracture of the distal radius 2. Mildly displaced ulnar styloid process fracture Electronically Signed   By: Donavan Foil M.D.   On: 04/02/2016 16:17   Ct Pelvis Wo Contrast  Result Date: 04/02/2016 CLINICAL DATA:  82 year old female with history of trauma from a fall today complaining of pain in the left hip. EXAM: CT PELVIS WITHOUT CONTRAST TECHNIQUE: Multidetector CT imaging of the pelvis was performed following the standard protocol without intravenous contrast. COMPARISON:  No priors. FINDINGS: Urinary Tract:  Urinary bladder appears intact and is unremarkable. Bowel: Visualized portions of small bowel and colon are remarkable for a few scattered colonic diverticulae, but are otherwise unremarkable. Vascular/Lymphatic: Aortic atherosclerosis and calcified atherosclerotic plaque throughout the pelvic vasculature. No definite aneurysm on today's noncontrast CT examination. No lymphadenopathy. Reproductive: Status post hysterectomy. Ovaries are not confidently identified may be surgically absent or atrophic. Other: No significant volume of ascites and no pneumoperitoneum in the visualized portions of the peritoneal cavity. Musculoskeletal: There is a nondisplaced fracture through the parasymphyseal region of the left pubic bone which appears minimally comminuted, best appreciated on the coronal reconstructions.  Remaining portions of the bony pelvis otherwise appear intact. Bilateral proximal femurs as visualized are intact, and the femoral heads are located bilaterally. IMPRESSION: 1. Minimally comminuted nondisplaced fracture of the parasymphyseal region of the left pubic bone, as above. 2. Aortic atherosclerosis. 3. Colonic  diverticulosis. Electronically Signed   By: Vinnie Langton M.D.   On: 04/02/2016 18:31   Mr Jeri Cos [id number] Contrast  Result Date: 04/02/2016 CLINICAL DATA:  Initial evaluation for acute slurred speech. EXAM: MRI HEAD WITHOUT AND WITH CONTRAST TECHNIQUE: Multiplanar, multiecho pulse sequences of the brain and surrounding structures were obtained without and with intravenous contrast. CONTRAST:  42mL MULTIHANCE GADOBENATE DIMEGLUMINE 529 MG/ML IV SOLN COMPARISON:  Comparison made with prior CT from 01/03/2016. FINDINGS: Brain: Diffusion-weighted imaging demonstrates no evidence for acute or subacute ischemia. Diffuse prominence of the CSF containing spaces is compatible with generalized age-related cerebral atrophy. Patchy and confluent T2/FLAIR hyperintensity within the periventricular and deep white matter both cerebral hemispheres most compatible chronic small vessel ischemic disease, mild for age. There is a partially solid/partially cystic mass centered at the posterior left sylvian fissure. Solid enhancing nodular component measures 12 x 11 x 7 mm. Cystic component measures 23 x 25 x 23 mm. No significant vasogenic edema. Additional 9 x 7 x 8 mm enhancing nodule within the right parasagittal parieto-occipital region (series 10, image 40). No significant edema. Additional punctate 3 mm nodular focus of enhancement within the right occipital lobe with mild localized edema (series 10, image 27). Few additional punctate nodular foci of enhancement seen within the right parietal lobe (series 10, image 43) as well as the left parietal lobe (series 10, image 36). Small bilateral punctate cerebellar  lesions (series 10, image 22 on the right, image 21 on the left). Findings are most compatible with intracranial metastatic disease. No significant mass effect. No midline shift. No associated hemorrhage on gradient echo sequence. No other acute intracranial process. Ventricles normal in size without evidence for hydrocephalus. No extra-axial fluid collection. Major dural sinuses are grossly patent. Pituitary gland within normal limits. Vascular: Major intracranial vascular flow voids are maintained. Skull and upper cervical spine: Craniocervical junction within normal limits. Advanced degenerative spondylolysis noted within the visualized upper cervical spine with mild to moderate diffuse stenosis. Bone marrow signal intensity within normal limits. No scalp soft tissue abnormality. Sinuses/Orbits: Globes and orbital soft tissues within normal limits. Patient is status post lens extraction bilaterally. Paranasal sinuses are clear. No mastoid effusion. Inner ear structures grossly normal. IMPRESSION: 1. Multiple intracranial metastases as detailed above. Dominant lesion is partially solid and partially cystic in nature, positioned at the posterior left sylvian fissure. No significant mass effect or other complication identified. 2. No other acute intracranial process identified. Electronically Signed   By: Jeannine Boga M.D.   On: 04/02/2016 22:24   Dg Hip Unilat W Or Wo Pelvis 2-3 Views Left  Result Date: 04/02/2016 CLINICAL DATA:  Fall with hip pain EXAM: DG HIP (WITH OR WITHOUT PELVIS) 2-3V LEFT COMPARISON:  None. FINDINGS: Mild SI joint disease.  Calcified pelvic phleboliths. Left femoral head is normally positioned. There are nondisplaced fractures involving the left superior and inferior pubic bone with nondisplaced fracture of the left inferior ramus. IMPRESSION: Nondisplaced left pubic bone fracture. Electronically Signed   By: Donavan Foil M.D.   On: 04/02/2016 16:23    Assessment/Plan Brain  metastases [facility]) Hospice referral, comfort measures, Morphine 5mg  q2h, continue Fentanyl 75mcg/hr. Continue Keppra for seizure prevention.   Pelvic fracture (HCC) Superior ramus of right pubis and left distal radius, continue short cast, managing pain.   Closed fracture of left distal radius Continue left short cast  Fracture of multiple pubic rami, right, closed, initial encounter ([place]) Managing pain  Insomnia Improved, continue Mirtazapine.  Family/ staff Communication: SNF, hospice referral.   Labs/tests ordered:  none

## 2016-04-17 NOTE — Assessment & Plan Note (Addendum)
Hospice referral, comfort measures, Morphine 5mg  q2h, continue Fentanyl 53mcg/hr. Continue Keppra for seizure prevention.

## 2016-04-17 NOTE — Assessment & Plan Note (Signed)
Superior ramus of right pubis and left distal radius, continue short cast, managing pain.

## 2016-04-17 NOTE — Assessment & Plan Note (Signed)
Managing pain.  

## 2016-04-17 NOTE — Assessment & Plan Note (Addendum)
Continue left short cast

## 2016-04-18 DIAGNOSIS — M84350S Stress fracture, pelvis, sequela: Secondary | ICD-10-CM | POA: Diagnosis not present

## 2016-04-18 DIAGNOSIS — C4490 Unspecified malignant neoplasm of skin, unspecified: Secondary | ICD-10-CM | POA: Diagnosis not present

## 2016-04-18 DIAGNOSIS — Z8673 Personal history of transient ischemic attack (TIA), and cerebral infarction without residual deficits: Secondary | ICD-10-CM | POA: Diagnosis not present

## 2016-04-18 DIAGNOSIS — C7931 Secondary malignant neoplasm of brain: Secondary | ICD-10-CM | POA: Diagnosis not present

## 2016-04-18 DIAGNOSIS — F411 Generalized anxiety disorder: Secondary | ICD-10-CM | POA: Diagnosis not present

## 2016-04-18 DIAGNOSIS — I10 Essential (primary) hypertension: Secondary | ICD-10-CM | POA: Diagnosis not present

## 2016-04-18 DIAGNOSIS — R911 Solitary pulmonary nodule: Secondary | ICD-10-CM | POA: Diagnosis not present

## 2016-04-18 DIAGNOSIS — C801 Malignant (primary) neoplasm, unspecified: Secondary | ICD-10-CM | POA: Diagnosis not present

## 2016-04-18 DIAGNOSIS — H11149 Conjunctival xerosis, unspecified, unspecified eye: Secondary | ICD-10-CM | POA: Diagnosis not present

## 2016-04-18 DIAGNOSIS — R630 Anorexia: Secondary | ICD-10-CM | POA: Diagnosis not present

## 2016-04-18 DIAGNOSIS — R634 Abnormal weight loss: Secondary | ICD-10-CM | POA: Diagnosis not present

## 2016-04-19 DIAGNOSIS — C801 Malignant (primary) neoplasm, unspecified: Secondary | ICD-10-CM | POA: Diagnosis not present

## 2016-04-19 DIAGNOSIS — C7931 Secondary malignant neoplasm of brain: Secondary | ICD-10-CM | POA: Diagnosis not present

## 2016-04-19 DIAGNOSIS — M84350S Stress fracture, pelvis, sequela: Secondary | ICD-10-CM | POA: Diagnosis not present

## 2016-04-19 DIAGNOSIS — R911 Solitary pulmonary nodule: Secondary | ICD-10-CM | POA: Diagnosis not present

## 2016-04-19 DIAGNOSIS — Z8673 Personal history of transient ischemic attack (TIA), and cerebral infarction without residual deficits: Secondary | ICD-10-CM | POA: Diagnosis not present

## 2016-04-19 DIAGNOSIS — C4490 Unspecified malignant neoplasm of skin, unspecified: Secondary | ICD-10-CM | POA: Diagnosis not present

## 2016-04-21 DIAGNOSIS — R911 Solitary pulmonary nodule: Secondary | ICD-10-CM | POA: Diagnosis not present

## 2016-04-21 DIAGNOSIS — M84350S Stress fracture, pelvis, sequela: Secondary | ICD-10-CM | POA: Diagnosis not present

## 2016-04-21 DIAGNOSIS — C4490 Unspecified malignant neoplasm of skin, unspecified: Secondary | ICD-10-CM | POA: Diagnosis not present

## 2016-04-21 DIAGNOSIS — C7931 Secondary malignant neoplasm of brain: Secondary | ICD-10-CM | POA: Diagnosis not present

## 2016-04-21 DIAGNOSIS — C801 Malignant (primary) neoplasm, unspecified: Secondary | ICD-10-CM | POA: Diagnosis not present

## 2016-04-21 DIAGNOSIS — Z8673 Personal history of transient ischemic attack (TIA), and cerebral infarction without residual deficits: Secondary | ICD-10-CM | POA: Diagnosis not present

## 2016-04-22 DIAGNOSIS — I10 Essential (primary) hypertension: Secondary | ICD-10-CM | POA: Diagnosis not present

## 2016-04-22 DIAGNOSIS — F411 Generalized anxiety disorder: Secondary | ICD-10-CM | POA: Diagnosis not present

## 2016-04-22 DIAGNOSIS — C4490 Unspecified malignant neoplasm of skin, unspecified: Secondary | ICD-10-CM | POA: Diagnosis not present

## 2016-04-22 DIAGNOSIS — R630 Anorexia: Secondary | ICD-10-CM | POA: Diagnosis not present

## 2016-04-22 DIAGNOSIS — R911 Solitary pulmonary nodule: Secondary | ICD-10-CM | POA: Diagnosis not present

## 2016-04-22 DIAGNOSIS — C7931 Secondary malignant neoplasm of brain: Secondary | ICD-10-CM | POA: Diagnosis not present

## 2016-04-22 DIAGNOSIS — R634 Abnormal weight loss: Secondary | ICD-10-CM | POA: Diagnosis not present

## 2016-04-22 DIAGNOSIS — M84350S Stress fracture, pelvis, sequela: Secondary | ICD-10-CM | POA: Diagnosis not present

## 2016-04-22 DIAGNOSIS — C801 Malignant (primary) neoplasm, unspecified: Secondary | ICD-10-CM | POA: Diagnosis not present

## 2016-04-22 DIAGNOSIS — Z8673 Personal history of transient ischemic attack (TIA), and cerebral infarction without residual deficits: Secondary | ICD-10-CM | POA: Diagnosis not present

## 2016-04-22 DIAGNOSIS — H11149 Conjunctival xerosis, unspecified, unspecified eye: Secondary | ICD-10-CM | POA: Diagnosis not present

## 2016-04-23 DIAGNOSIS — M84350S Stress fracture, pelvis, sequela: Secondary | ICD-10-CM | POA: Diagnosis not present

## 2016-04-23 DIAGNOSIS — Z8673 Personal history of transient ischemic attack (TIA), and cerebral infarction without residual deficits: Secondary | ICD-10-CM | POA: Diagnosis not present

## 2016-04-23 DIAGNOSIS — C4490 Unspecified malignant neoplasm of skin, unspecified: Secondary | ICD-10-CM | POA: Diagnosis not present

## 2016-04-23 DIAGNOSIS — R911 Solitary pulmonary nodule: Secondary | ICD-10-CM | POA: Diagnosis not present

## 2016-04-23 DIAGNOSIS — C7931 Secondary malignant neoplasm of brain: Secondary | ICD-10-CM | POA: Diagnosis not present

## 2016-04-23 DIAGNOSIS — C801 Malignant (primary) neoplasm, unspecified: Secondary | ICD-10-CM | POA: Diagnosis not present

## 2016-04-24 DIAGNOSIS — C7931 Secondary malignant neoplasm of brain: Secondary | ICD-10-CM | POA: Diagnosis not present

## 2016-04-24 DIAGNOSIS — Z8673 Personal history of transient ischemic attack (TIA), and cerebral infarction without residual deficits: Secondary | ICD-10-CM | POA: Diagnosis not present

## 2016-04-24 DIAGNOSIS — C801 Malignant (primary) neoplasm, unspecified: Secondary | ICD-10-CM | POA: Diagnosis not present

## 2016-04-24 DIAGNOSIS — C4490 Unspecified malignant neoplasm of skin, unspecified: Secondary | ICD-10-CM | POA: Diagnosis not present

## 2016-04-24 DIAGNOSIS — R911 Solitary pulmonary nodule: Secondary | ICD-10-CM | POA: Diagnosis not present

## 2016-04-24 DIAGNOSIS — M84350S Stress fracture, pelvis, sequela: Secondary | ICD-10-CM | POA: Diagnosis not present

## 2016-04-25 DIAGNOSIS — R911 Solitary pulmonary nodule: Secondary | ICD-10-CM | POA: Diagnosis not present

## 2016-04-25 DIAGNOSIS — M84350S Stress fracture, pelvis, sequela: Secondary | ICD-10-CM | POA: Diagnosis not present

## 2016-04-25 DIAGNOSIS — Z8673 Personal history of transient ischemic attack (TIA), and cerebral infarction without residual deficits: Secondary | ICD-10-CM | POA: Diagnosis not present

## 2016-04-25 DIAGNOSIS — C7931 Secondary malignant neoplasm of brain: Secondary | ICD-10-CM | POA: Diagnosis not present

## 2016-04-25 DIAGNOSIS — C4490 Unspecified malignant neoplasm of skin, unspecified: Secondary | ICD-10-CM | POA: Diagnosis not present

## 2016-04-25 DIAGNOSIS — C801 Malignant (primary) neoplasm, unspecified: Secondary | ICD-10-CM | POA: Diagnosis not present

## 2016-04-26 DIAGNOSIS — Z8673 Personal history of transient ischemic attack (TIA), and cerebral infarction without residual deficits: Secondary | ICD-10-CM | POA: Diagnosis not present

## 2016-04-26 DIAGNOSIS — M84350S Stress fracture, pelvis, sequela: Secondary | ICD-10-CM | POA: Diagnosis not present

## 2016-04-26 DIAGNOSIS — C801 Malignant (primary) neoplasm, unspecified: Secondary | ICD-10-CM | POA: Diagnosis not present

## 2016-04-26 DIAGNOSIS — C4490 Unspecified malignant neoplasm of skin, unspecified: Secondary | ICD-10-CM | POA: Diagnosis not present

## 2016-04-26 DIAGNOSIS — C7931 Secondary malignant neoplasm of brain: Secondary | ICD-10-CM | POA: Diagnosis not present

## 2016-04-26 DIAGNOSIS — R911 Solitary pulmonary nodule: Secondary | ICD-10-CM | POA: Diagnosis not present

## 2016-04-28 DIAGNOSIS — C801 Malignant (primary) neoplasm, unspecified: Secondary | ICD-10-CM | POA: Diagnosis not present

## 2016-04-28 DIAGNOSIS — C4490 Unspecified malignant neoplasm of skin, unspecified: Secondary | ICD-10-CM | POA: Diagnosis not present

## 2016-04-28 DIAGNOSIS — C7931 Secondary malignant neoplasm of brain: Secondary | ICD-10-CM | POA: Diagnosis not present

## 2016-04-28 DIAGNOSIS — R911 Solitary pulmonary nodule: Secondary | ICD-10-CM | POA: Diagnosis not present

## 2016-04-28 DIAGNOSIS — Z8673 Personal history of transient ischemic attack (TIA), and cerebral infarction without residual deficits: Secondary | ICD-10-CM | POA: Diagnosis not present

## 2016-04-28 DIAGNOSIS — M84350S Stress fracture, pelvis, sequela: Secondary | ICD-10-CM | POA: Diagnosis not present

## 2016-04-29 DIAGNOSIS — M84350S Stress fracture, pelvis, sequela: Secondary | ICD-10-CM | POA: Diagnosis not present

## 2016-04-29 DIAGNOSIS — C7931 Secondary malignant neoplasm of brain: Secondary | ICD-10-CM | POA: Diagnosis not present

## 2016-04-29 DIAGNOSIS — C801 Malignant (primary) neoplasm, unspecified: Secondary | ICD-10-CM | POA: Diagnosis not present

## 2016-04-29 DIAGNOSIS — R911 Solitary pulmonary nodule: Secondary | ICD-10-CM | POA: Diagnosis not present

## 2016-04-29 DIAGNOSIS — C4490 Unspecified malignant neoplasm of skin, unspecified: Secondary | ICD-10-CM | POA: Diagnosis not present

## 2016-04-29 DIAGNOSIS — Z8673 Personal history of transient ischemic attack (TIA), and cerebral infarction without residual deficits: Secondary | ICD-10-CM | POA: Diagnosis not present

## 2016-05-03 DIAGNOSIS — M84350S Stress fracture, pelvis, sequela: Secondary | ICD-10-CM | POA: Diagnosis not present

## 2016-05-03 DIAGNOSIS — C7931 Secondary malignant neoplasm of brain: Secondary | ICD-10-CM | POA: Diagnosis not present

## 2016-05-03 DIAGNOSIS — Z8673 Personal history of transient ischemic attack (TIA), and cerebral infarction without residual deficits: Secondary | ICD-10-CM | POA: Diagnosis not present

## 2016-05-03 DIAGNOSIS — C4490 Unspecified malignant neoplasm of skin, unspecified: Secondary | ICD-10-CM | POA: Diagnosis not present

## 2016-05-03 DIAGNOSIS — C801 Malignant (primary) neoplasm, unspecified: Secondary | ICD-10-CM | POA: Diagnosis not present

## 2016-05-03 DIAGNOSIS — R911 Solitary pulmonary nodule: Secondary | ICD-10-CM | POA: Diagnosis not present

## 2016-05-06 DIAGNOSIS — C801 Malignant (primary) neoplasm, unspecified: Secondary | ICD-10-CM | POA: Diagnosis not present

## 2016-05-06 DIAGNOSIS — C7931 Secondary malignant neoplasm of brain: Secondary | ICD-10-CM | POA: Diagnosis not present

## 2016-05-06 DIAGNOSIS — C4490 Unspecified malignant neoplasm of skin, unspecified: Secondary | ICD-10-CM | POA: Diagnosis not present

## 2016-05-06 DIAGNOSIS — R911 Solitary pulmonary nodule: Secondary | ICD-10-CM | POA: Diagnosis not present

## 2016-05-06 DIAGNOSIS — M84350S Stress fracture, pelvis, sequela: Secondary | ICD-10-CM | POA: Diagnosis not present

## 2016-05-06 DIAGNOSIS — Z8673 Personal history of transient ischemic attack (TIA), and cerebral infarction without residual deficits: Secondary | ICD-10-CM | POA: Diagnosis not present

## 2016-05-07 DIAGNOSIS — R911 Solitary pulmonary nodule: Secondary | ICD-10-CM | POA: Diagnosis not present

## 2016-05-07 DIAGNOSIS — Z8673 Personal history of transient ischemic attack (TIA), and cerebral infarction without residual deficits: Secondary | ICD-10-CM | POA: Diagnosis not present

## 2016-05-07 DIAGNOSIS — C7931 Secondary malignant neoplasm of brain: Secondary | ICD-10-CM | POA: Diagnosis not present

## 2016-05-07 DIAGNOSIS — M84350S Stress fracture, pelvis, sequela: Secondary | ICD-10-CM | POA: Diagnosis not present

## 2016-05-07 DIAGNOSIS — C801 Malignant (primary) neoplasm, unspecified: Secondary | ICD-10-CM | POA: Diagnosis not present

## 2016-05-07 DIAGNOSIS — C4490 Unspecified malignant neoplasm of skin, unspecified: Secondary | ICD-10-CM | POA: Diagnosis not present

## 2016-05-08 ENCOUNTER — Other Ambulatory Visit: Payer: Self-pay | Admitting: *Deleted

## 2016-05-08 DIAGNOSIS — S32511D Fracture of superior rim of right pubis, subsequent encounter for fracture with routine healing: Secondary | ICD-10-CM

## 2016-05-08 DIAGNOSIS — Z8673 Personal history of transient ischemic attack (TIA), and cerebral infarction without residual deficits: Secondary | ICD-10-CM | POA: Diagnosis not present

## 2016-05-08 DIAGNOSIS — C801 Malignant (primary) neoplasm, unspecified: Secondary | ICD-10-CM | POA: Diagnosis not present

## 2016-05-08 DIAGNOSIS — C4490 Unspecified malignant neoplasm of skin, unspecified: Secondary | ICD-10-CM | POA: Diagnosis not present

## 2016-05-08 DIAGNOSIS — C7931 Secondary malignant neoplasm of brain: Secondary | ICD-10-CM | POA: Diagnosis not present

## 2016-05-08 DIAGNOSIS — M84350S Stress fracture, pelvis, sequela: Secondary | ICD-10-CM | POA: Diagnosis not present

## 2016-05-08 DIAGNOSIS — R911 Solitary pulmonary nodule: Secondary | ICD-10-CM | POA: Diagnosis not present

## 2016-05-08 MED ORDER — FENTANYL 50 MCG/HR TD PT72: MEDICATED_PATCH | TRANSDERMAL | 0 refills | Status: AC

## 2016-05-08 NOTE — Telephone Encounter (Signed)
.  [facility]

## 2016-05-09 DIAGNOSIS — C7931 Secondary malignant neoplasm of brain: Secondary | ICD-10-CM | POA: Diagnosis not present

## 2016-05-09 DIAGNOSIS — M84350S Stress fracture, pelvis, sequela: Secondary | ICD-10-CM | POA: Diagnosis not present

## 2016-05-09 DIAGNOSIS — C4490 Unspecified malignant neoplasm of skin, unspecified: Secondary | ICD-10-CM | POA: Diagnosis not present

## 2016-05-09 DIAGNOSIS — C801 Malignant (primary) neoplasm, unspecified: Secondary | ICD-10-CM | POA: Diagnosis not present

## 2016-05-09 DIAGNOSIS — Z8673 Personal history of transient ischemic attack (TIA), and cerebral infarction without residual deficits: Secondary | ICD-10-CM | POA: Diagnosis not present

## 2016-05-09 DIAGNOSIS — R911 Solitary pulmonary nodule: Secondary | ICD-10-CM | POA: Diagnosis not present

## 2016-05-10 DIAGNOSIS — R911 Solitary pulmonary nodule: Secondary | ICD-10-CM | POA: Diagnosis not present

## 2016-05-10 DIAGNOSIS — Z8673 Personal history of transient ischemic attack (TIA), and cerebral infarction without residual deficits: Secondary | ICD-10-CM | POA: Diagnosis not present

## 2016-05-10 DIAGNOSIS — M84350S Stress fracture, pelvis, sequela: Secondary | ICD-10-CM | POA: Diagnosis not present

## 2016-05-10 DIAGNOSIS — C4490 Unspecified malignant neoplasm of skin, unspecified: Secondary | ICD-10-CM | POA: Diagnosis not present

## 2016-05-10 DIAGNOSIS — C7931 Secondary malignant neoplasm of brain: Secondary | ICD-10-CM | POA: Diagnosis not present

## 2016-05-10 DIAGNOSIS — C801 Malignant (primary) neoplasm, unspecified: Secondary | ICD-10-CM | POA: Diagnosis not present

## 2016-05-13 ENCOUNTER — Other Ambulatory Visit: Payer: Self-pay | Admitting: *Deleted

## 2016-05-13 ENCOUNTER — Encounter: Payer: Self-pay | Admitting: Internal Medicine

## 2016-05-13 DIAGNOSIS — C7931 Secondary malignant neoplasm of brain: Secondary | ICD-10-CM | POA: Diagnosis not present

## 2016-05-13 DIAGNOSIS — C4490 Unspecified malignant neoplasm of skin, unspecified: Secondary | ICD-10-CM | POA: Diagnosis not present

## 2016-05-13 DIAGNOSIS — C801 Malignant (primary) neoplasm, unspecified: Secondary | ICD-10-CM | POA: Diagnosis not present

## 2016-05-13 DIAGNOSIS — R911 Solitary pulmonary nodule: Secondary | ICD-10-CM | POA: Diagnosis not present

## 2016-05-13 DIAGNOSIS — Z8673 Personal history of transient ischemic attack (TIA), and cerebral infarction without residual deficits: Secondary | ICD-10-CM | POA: Diagnosis not present

## 2016-05-13 DIAGNOSIS — M84350S Stress fracture, pelvis, sequela: Secondary | ICD-10-CM | POA: Diagnosis not present

## 2016-05-13 MED ORDER — HYDROCODONE-ACETAMINOPHEN 5-325 MG PO TABS: ORAL_TABLET | ORAL | 0 refills | Status: AC

## 2016-05-13 NOTE — Telephone Encounter (Signed)
[facility] Skilled [phone] Fax: [phone]

## 2016-05-14 DIAGNOSIS — R911 Solitary pulmonary nodule: Secondary | ICD-10-CM | POA: Diagnosis not present

## 2016-05-14 DIAGNOSIS — C801 Malignant (primary) neoplasm, unspecified: Secondary | ICD-10-CM | POA: Diagnosis not present

## 2016-05-14 DIAGNOSIS — M84350S Stress fracture, pelvis, sequela: Secondary | ICD-10-CM | POA: Diagnosis not present

## 2016-05-14 DIAGNOSIS — C7931 Secondary malignant neoplasm of brain: Secondary | ICD-10-CM | POA: Diagnosis not present

## 2016-05-14 DIAGNOSIS — C4490 Unspecified malignant neoplasm of skin, unspecified: Secondary | ICD-10-CM | POA: Diagnosis not present

## 2016-05-14 DIAGNOSIS — Z8673 Personal history of transient ischemic attack (TIA), and cerebral infarction without residual deficits: Secondary | ICD-10-CM | POA: Diagnosis not present

## 2016-05-23 DEATH — deceased

## 2016-06-06 ENCOUNTER — Ambulatory Visit: Payer: Medicare Other | Admitting: Neurology

## 2016-06-07 ENCOUNTER — Ambulatory Visit: Payer: Medicare Other | Admitting: Neurology

## 2016-08-01 ENCOUNTER — Encounter: Payer: Medicare Other | Admitting: Internal Medicine

## 2016-11-19 IMAGING — DX DG CHEST 2V
2 series · 2 of 2 positions shown · non-contrast
Comparison: 02/03/2015

CLINICAL DATA: Follow-up lung nodules.

EXAM:
CHEST  2 VIEW

[chest pa]
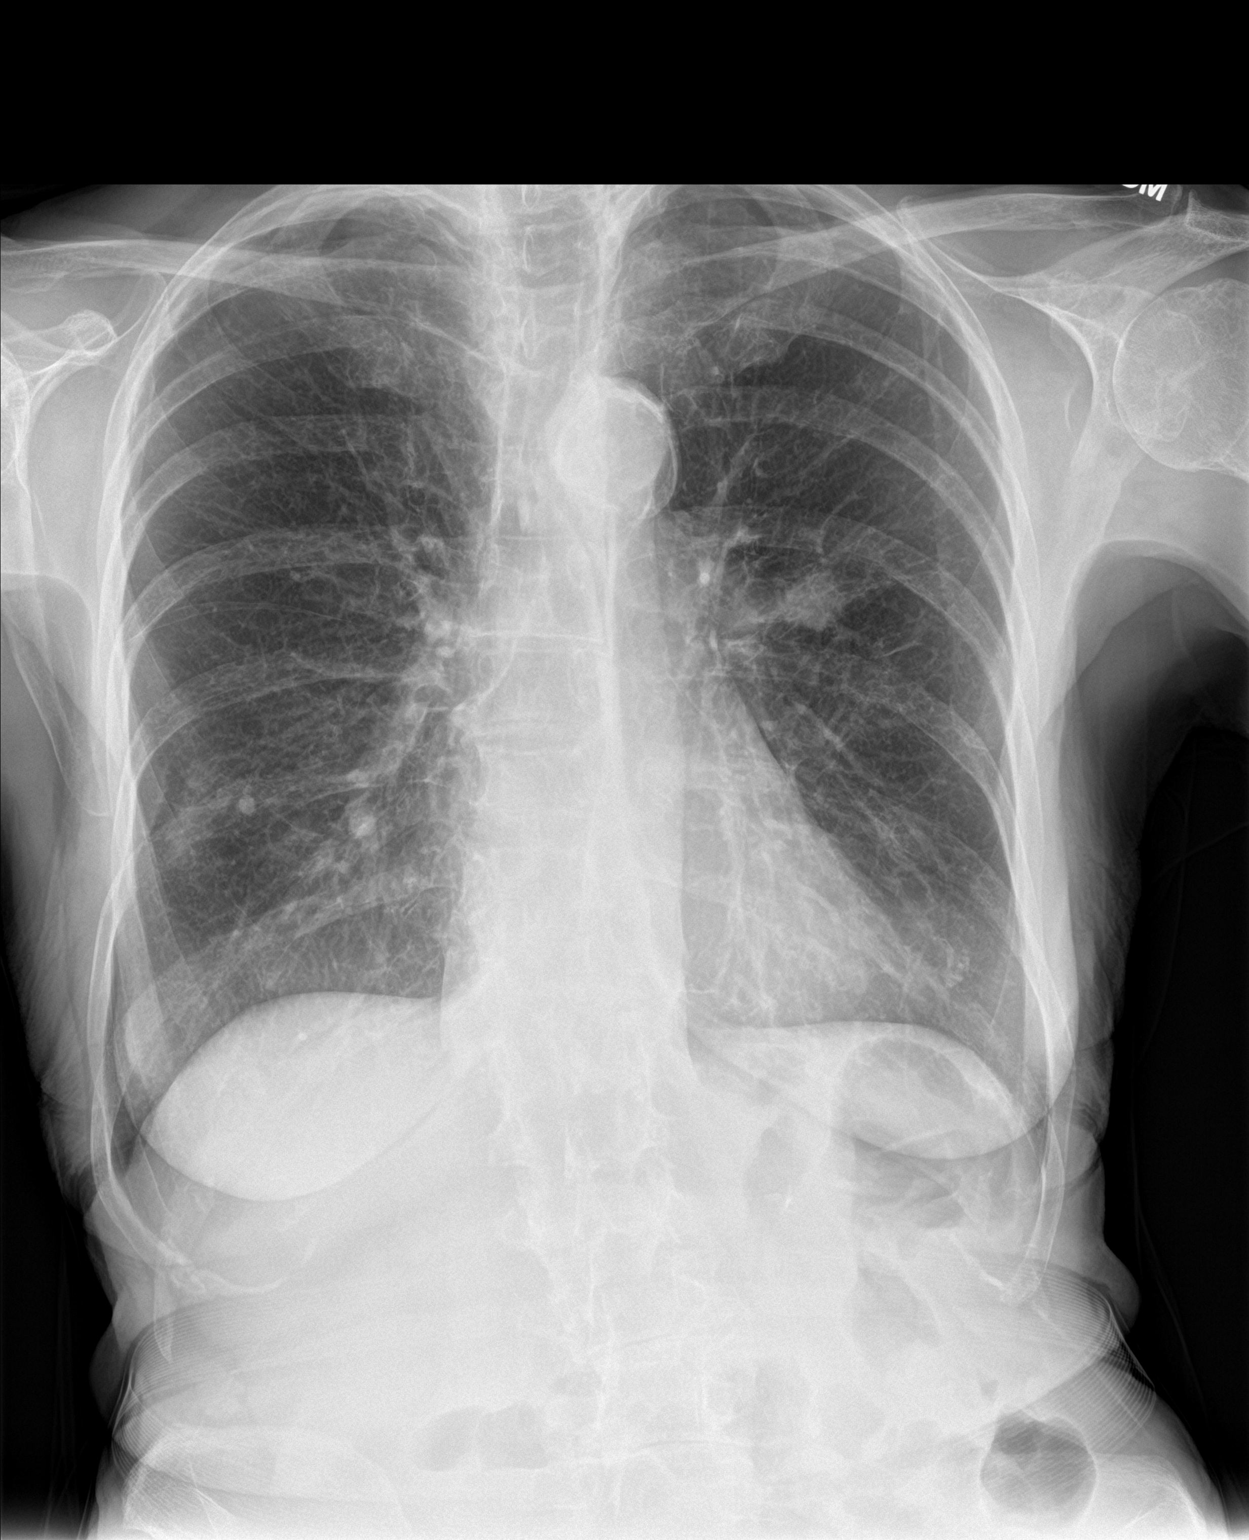

[chest lat]
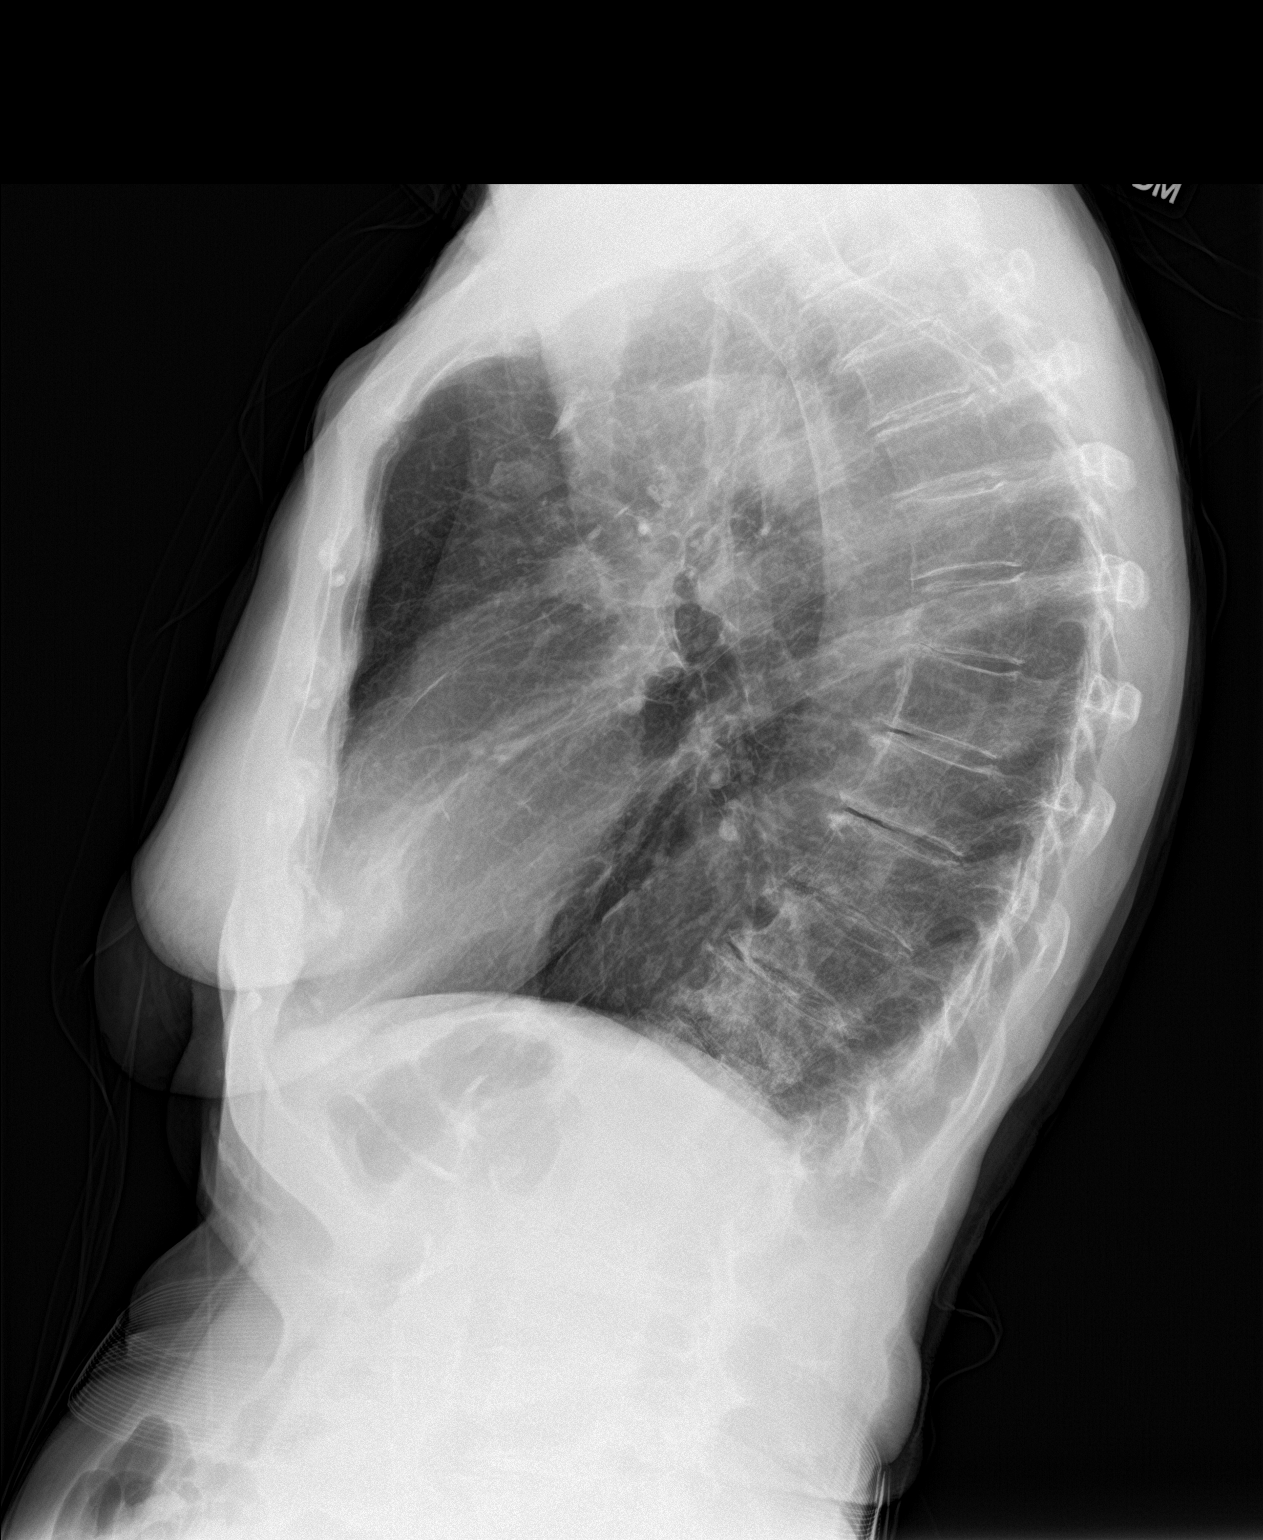

[2 of 2 positions shown; findings below may reference images not displayed]

FINDINGS: Left perihilar nodule is noted, better visualized on today's study.
This may have enlarged slightly since prior study. Probable
calcified granuloma in the right lower lung. No effusions. Heart is
normal size. No acute bony abnormality. Degenerative changes in the
thoracic spine. Aortic arch calcifications noted. No visible
aneurysm.
IMPRESSION: Left perihilar nodule better seen on today's study and possibly
slightly larger. This could be further evaluated with chest CT.

Aortic atherosclerosis.

Old granulomatous disease in the right lung.
